# Patient Record
Sex: Male | Born: 1965 | Race: White | Marital: Single | Smoking: Current every day smoker
Health system: Southern US, Community
[De-identification: ages and names within clinical notes are randomized; demographics above are authoritative.]

## PROBLEM LIST (undated history)

## (undated) DIAGNOSIS — G459 Transient cerebral ischemic attack, unspecified: Secondary | ICD-10-CM

## (undated) DIAGNOSIS — I219 Acute myocardial infarction, unspecified: Secondary | ICD-10-CM

## (undated) DIAGNOSIS — F1910 Other psychoactive substance abuse, uncomplicated: Secondary | ICD-10-CM

## (undated) DIAGNOSIS — F319 Bipolar disorder, unspecified: Secondary | ICD-10-CM

## (undated) DIAGNOSIS — M549 Dorsalgia, unspecified: Secondary | ICD-10-CM

## (undated) DIAGNOSIS — M1990 Unspecified osteoarthritis, unspecified site: Secondary | ICD-10-CM

## (undated) DIAGNOSIS — Z720 Tobacco use: Secondary | ICD-10-CM

## (undated) HISTORY — PX: FOOT SURGERY: SHX648

## (undated) HISTORY — DX: Unspecified osteoarthritis, unspecified site: M19.90

## (undated) HISTORY — PX: PACEMAKER PLACEMENT: SHX43

---

## 2004-01-01 ENCOUNTER — Emergency Department: Payer: Self-pay | Admitting: General Practice

## 2004-04-02 ENCOUNTER — Emergency Department: Payer: Self-pay | Admitting: Emergency Medicine

## 2006-04-02 ENCOUNTER — Emergency Department: Payer: Self-pay | Admitting: Emergency Medicine

## 2008-07-08 ENCOUNTER — Emergency Department (HOSPITAL_COMMUNITY): Admission: EM | Admit: 2008-07-08 | Discharge: 2008-07-08 | Payer: Self-pay | Admitting: Emergency Medicine

## 2008-08-07 ENCOUNTER — Emergency Department (HOSPITAL_COMMUNITY): Admission: EM | Admit: 2008-08-07 | Discharge: 2008-08-07 | Payer: Self-pay | Admitting: Emergency Medicine

## 2009-09-08 ENCOUNTER — Inpatient Hospital Stay: Payer: Self-pay | Admitting: Unknown Physician Specialty

## 2009-12-08 ENCOUNTER — Emergency Department (HOSPITAL_COMMUNITY): Admission: EM | Admit: 2009-12-08 | Discharge: 2009-12-08 | Payer: Self-pay | Admitting: Emergency Medicine

## 2009-12-15 ENCOUNTER — Inpatient Hospital Stay: Payer: Self-pay | Admitting: Unknown Physician Specialty

## 2009-12-30 ENCOUNTER — Emergency Department (HOSPITAL_COMMUNITY): Admission: EM | Admit: 2009-12-30 | Discharge: 2009-12-30 | Payer: Self-pay | Admitting: Emergency Medicine

## 2010-01-01 ENCOUNTER — Emergency Department (HOSPITAL_COMMUNITY): Admission: EM | Admit: 2010-01-01 | Discharge: 2010-01-01 | Payer: Self-pay | Admitting: Emergency Medicine

## 2010-01-05 ENCOUNTER — Emergency Department (HOSPITAL_COMMUNITY): Admission: EM | Admit: 2010-01-05 | Discharge: 2010-01-05 | Payer: Self-pay | Admitting: Emergency Medicine

## 2010-01-16 ENCOUNTER — Emergency Department: Payer: Self-pay | Admitting: Emergency Medicine

## 2010-05-04 ENCOUNTER — Ambulatory Visit: Payer: Self-pay | Admitting: Sports Medicine

## 2010-06-17 ENCOUNTER — Emergency Department (HOSPITAL_COMMUNITY)
Admission: EM | Admit: 2010-06-17 | Discharge: 2010-06-17 | Disposition: A | Discharge status: Home / Self Care | Payer: Self-pay | Attending: Emergency Medicine | Admitting: Emergency Medicine

## 2010-06-17 DIAGNOSIS — T310 Burns involving less than 10% of body surface: Secondary | ICD-10-CM | POA: Insufficient documentation

## 2010-06-17 DIAGNOSIS — T22219A Burn of second degree of unspecified forearm, initial encounter: Secondary | ICD-10-CM | POA: Insufficient documentation

## 2010-06-17 DIAGNOSIS — IMO0002 Reserved for concepts with insufficient information to code with codable children: Secondary | ICD-10-CM | POA: Insufficient documentation

## 2010-06-17 DIAGNOSIS — X010XXA Exposure to flames in uncontrolled fire, not in building or structure, initial encounter: Secondary | ICD-10-CM | POA: Insufficient documentation

## 2010-06-17 DIAGNOSIS — Y929 Unspecified place or not applicable: Secondary | ICD-10-CM | POA: Insufficient documentation

## 2010-06-17 DIAGNOSIS — F341 Dysthymic disorder: Secondary | ICD-10-CM | POA: Insufficient documentation

## 2010-06-25 LAB — DIFFERENTIAL
Eosinophils Absolute: 0.3 10*3/uL (ref 0.0–0.7)
Eosinophils Relative: 4 % (ref 0–5)
Lymphs Abs: 2.2 10*3/uL (ref 0.7–4.0)

## 2010-06-25 LAB — URINALYSIS, ROUTINE W REFLEX MICROSCOPIC
Bilirubin Urine: NEGATIVE
Protein, ur: NEGATIVE mg/dL
Urobilinogen, UA: 0.2 mg/dL (ref 0.0–1.0)

## 2010-06-25 LAB — POCT I-STAT, CHEM 8
Creatinine, Ser: 1.2 mg/dL (ref 0.4–1.5)
Glucose, Bld: 102 mg/dL — ABNORMAL HIGH (ref 70–99)
Hemoglobin: 16.3 g/dL (ref 13.0–17.0)
Sodium: 141 mEq/L (ref 135–145)
TCO2: 24 mmol/L (ref 0–100)

## 2010-06-25 LAB — CBC
HCT: 44.5 % (ref 39.0–52.0)
MCHC: 34.5 g/dL (ref 30.0–36.0)
MCV: 89.1 fL (ref 78.0–100.0)
Platelets: 186 10*3/uL (ref 150–400)
WBC: 7.9 10*3/uL (ref 4.0–10.5)

## 2010-07-20 ENCOUNTER — Emergency Department (HOSPITAL_COMMUNITY)
Admission: EM | Admit: 2010-07-20 | Discharge: 2010-07-20 | Disposition: A | Discharge status: Home / Self Care | Payer: Self-pay | Attending: Emergency Medicine | Admitting: Emergency Medicine

## 2010-07-20 ENCOUNTER — Emergency Department (HOSPITAL_COMMUNITY): Payer: Self-pay

## 2010-07-20 DIAGNOSIS — M25569 Pain in unspecified knee: Secondary | ICD-10-CM | POA: Insufficient documentation

## 2010-07-20 DIAGNOSIS — M542 Cervicalgia: Secondary | ICD-10-CM | POA: Insufficient documentation

## 2010-07-20 DIAGNOSIS — M545 Low back pain, unspecified: Secondary | ICD-10-CM | POA: Insufficient documentation

## 2010-07-20 DIAGNOSIS — M546 Pain in thoracic spine: Secondary | ICD-10-CM | POA: Insufficient documentation

## 2010-07-20 DIAGNOSIS — F341 Dysthymic disorder: Secondary | ICD-10-CM | POA: Insufficient documentation

## 2010-07-20 DIAGNOSIS — Z79899 Other long term (current) drug therapy: Secondary | ICD-10-CM | POA: Insufficient documentation

## 2010-08-27 ENCOUNTER — Emergency Department (HOSPITAL_COMMUNITY): Payer: Self-pay

## 2010-08-27 ENCOUNTER — Emergency Department (HOSPITAL_COMMUNITY)
Admission: EM | Admit: 2010-08-27 | Discharge: 2010-08-27 | Disposition: A | Discharge status: Home / Self Care | Payer: Self-pay | Attending: Emergency Medicine | Admitting: Emergency Medicine

## 2010-08-27 DIAGNOSIS — Z7982 Long term (current) use of aspirin: Secondary | ICD-10-CM | POA: Insufficient documentation

## 2010-08-27 DIAGNOSIS — F341 Dysthymic disorder: Secondary | ICD-10-CM | POA: Insufficient documentation

## 2010-08-27 DIAGNOSIS — G8929 Other chronic pain: Secondary | ICD-10-CM | POA: Insufficient documentation

## 2010-08-27 DIAGNOSIS — M25476 Effusion, unspecified foot: Secondary | ICD-10-CM | POA: Insufficient documentation

## 2010-08-27 DIAGNOSIS — M25473 Effusion, unspecified ankle: Secondary | ICD-10-CM | POA: Insufficient documentation

## 2010-08-27 DIAGNOSIS — M79609 Pain in unspecified limb: Secondary | ICD-10-CM | POA: Insufficient documentation

## 2010-08-27 DIAGNOSIS — Z79899 Other long term (current) drug therapy: Secondary | ICD-10-CM | POA: Insufficient documentation

## 2010-08-27 DIAGNOSIS — IMO0002 Reserved for concepts with insufficient information to code with codable children: Secondary | ICD-10-CM | POA: Insufficient documentation

## 2010-08-27 DIAGNOSIS — M25579 Pain in unspecified ankle and joints of unspecified foot: Secondary | ICD-10-CM | POA: Insufficient documentation

## 2010-12-29 ENCOUNTER — Emergency Department: Payer: Self-pay | Admitting: Emergency Medicine

## 2011-03-19 ENCOUNTER — Inpatient Hospital Stay: Payer: Self-pay | Admitting: Internal Medicine

## 2011-03-19 LAB — DRUG SCREEN, URINE
Barbiturates, Ur Screen: NEGATIVE (ref ?–200)
Cannabinoid 50 Ng, Ur ~~LOC~~: POSITIVE (ref ?–50)
Cocaine Metabolite,Ur ~~LOC~~: POSITIVE (ref ?–300)
Methadone, Ur Screen: NEGATIVE (ref ?–300)
Phencyclidine (PCP) Ur S: NEGATIVE (ref ?–25)

## 2011-03-19 LAB — BASIC METABOLIC PANEL
BUN: 11 mg/dL (ref 7–18)
Calcium, Total: 8.4 mg/dL — ABNORMAL LOW (ref 8.5–10.1)
Co2: 25 mmol/L (ref 21–32)
Creatinine: 1.02 mg/dL (ref 0.60–1.30)
EGFR (African American): >60
EGFR (Non-African Amer.): >60
Glucose: 88 mg/dL (ref 65–99)
Potassium: 3.6 mmol/L (ref 3.5–5.1)
Sodium: 146 mmol/L — ABNORMAL HIGH (ref 136–145)

## 2011-03-19 LAB — URINALYSIS, COMPLETE
Bacteria: NONE SEEN
Bilirubin,UR: NEGATIVE
Glucose,UR: NEGATIVE mg/dL (ref 0–75)
Leukocyte Esterase: NEGATIVE
Nitrite: NEGATIVE
Ph: 7 (ref 4.5–8.0)
RBC,UR: <1 /HPF (ref 0–5)
Squamous Epithelial: NONE SEEN

## 2011-03-19 LAB — ETHANOL: Ethanol: 79 mg/dL

## 2011-03-19 LAB — TROPONIN I
Troponin-I: <0.02 ng/mL
Troponin-I: <0.02 ng/mL

## 2011-03-19 LAB — CK-MB: CK-MB: 0.8 ng/mL (ref 0.5–3.6)

## 2011-03-19 LAB — CK TOTAL AND CKMB (NOT AT ARMC)
CK, Total: 74 U/L (ref 35–232)
CK, Total: 65 U/L (ref 35–232)
CK-MB: 0.9 ng/mL (ref 0.5–3.6)

## 2011-03-19 LAB — CBC
HGB: 14.5 g/dL (ref 13.0–18.0)
MCV: 90 fL (ref 80–100)
Platelet: 203 10*3/uL (ref 150–440)
RBC: 4.78 10*6/uL (ref 4.40–5.90)
RDW: 13.9 % (ref 11.5–14.5)
WBC: 9.1 10*3/uL (ref 3.8–10.6)

## 2011-03-20 LAB — CBC WITH DIFFERENTIAL/PLATELET
Eosinophil #: 0.4 10*3/uL (ref 0.0–0.7)
Eosinophil %: 5.1 %
HGB: 13.9 g/dL (ref 13.0–18.0)
Lymphocyte %: 26.2 %
MCH: 30.1 pg (ref 26.0–34.0)
Monocyte #: 1 10*3/uL — ABNORMAL HIGH (ref 0.0–0.7)
Neutrophil %: 55.6 %
Platelet: 161 10*3/uL (ref 150–440)
RBC: 4.61 10*6/uL (ref 4.40–5.90)

## 2011-03-20 LAB — BASIC METABOLIC PANEL
Anion Gap: 8 (ref 7–16)
BUN: 15 mg/dL (ref 7–18)
Calcium, Total: 8 mg/dL — ABNORMAL LOW (ref 8.5–10.1)
Chloride: 106 mmol/L (ref 98–107)
Co2: 30 mmol/L (ref 21–32)
Creatinine: 1.13 mg/dL (ref 0.60–1.30)
EGFR (African American): >60
EGFR (Non-African Amer.): >60
Glucose: 94 mg/dL (ref 65–99)
Osmolality: 287 (ref 275–301)

## 2011-03-20 LAB — LIPID PANEL: Triglycerides: 154 mg/dL (ref 0–200)

## 2011-05-06 LAB — BASIC METABOLIC PANEL
Anion Gap: 8 (ref 7–16)
BUN: 11 mg/dL (ref 7–18)
Calcium, Total: 8.1 mg/dL — ABNORMAL LOW (ref 8.5–10.1)
Chloride: 108 mmol/L — ABNORMAL HIGH (ref 98–107)
Co2: 28 mmol/L (ref 21–32)
Creatinine: 0.92 mg/dL (ref 0.60–1.30)
EGFR (African American): >60
Potassium: 4.3 mmol/L (ref 3.5–5.1)
Sodium: 144 mmol/L (ref 136–145)

## 2011-05-06 LAB — CBC
HCT: 43 % (ref 40.0–52.0)
MCH: 30.5 pg (ref 26.0–34.0)
MCV: 90 fL (ref 80–100)
Platelet: 197 10*3/uL (ref 150–440)
RDW: 13.7 % (ref 11.5–14.5)
WBC: 10.2 10*3/uL (ref 3.8–10.6)

## 2011-05-06 LAB — TROPONIN I: Troponin-I: <0.02 ng/mL

## 2011-05-07 ENCOUNTER — Observation Stay: Payer: Self-pay | Admitting: Student

## 2011-05-07 LAB — DRUG SCREEN, URINE
Barbiturates, Ur Screen: NEGATIVE (ref ?–200)
Cannabinoid 50 Ng, Ur ~~LOC~~: POSITIVE (ref ?–50)
Cocaine Metabolite,Ur ~~LOC~~: POSITIVE (ref ?–300)
MDMA (Ecstasy)Ur Screen: NEGATIVE (ref ?–500)
Opiate, Ur Screen: POSITIVE (ref ?–300)
Phencyclidine (PCP) Ur S: NEGATIVE (ref ?–25)

## 2011-05-07 LAB — TROPONIN I: Troponin-I: <0.02 ng/mL

## 2011-05-07 LAB — TSH: Thyroid Stimulating Horm: 4.64 u[IU]/mL — ABNORMAL HIGH

## 2011-05-07 LAB — CK TOTAL AND CKMB (NOT AT ARMC)
CK, Total: 105 U/L (ref 35–232)
CK-MB: 0.5 ng/mL (ref 0.5–3.6)

## 2011-05-07 LAB — CK-MB
CK-MB: 0.6 ng/mL (ref 0.5–3.6)
CK-MB: 0.5 ng/mL (ref 0.5–3.6)

## 2011-06-08 ENCOUNTER — Emergency Department (HOSPITAL_COMMUNITY)
Admission: EM | Admit: 2011-06-08 | Discharge: 2011-06-09 | Disposition: A | Discharge status: Home / Self Care | Payer: Self-pay | Attending: Emergency Medicine | Admitting: Emergency Medicine

## 2011-06-08 ENCOUNTER — Encounter (HOSPITAL_COMMUNITY): Payer: Self-pay | Admitting: *Deleted

## 2011-06-08 DIAGNOSIS — S025XXA Fracture of tooth (traumatic), initial encounter for closed fracture: Secondary | ICD-10-CM | POA: Insufficient documentation

## 2011-06-08 DIAGNOSIS — X58XXXA Exposure to other specified factors, initial encounter: Secondary | ICD-10-CM | POA: Insufficient documentation

## 2011-06-08 DIAGNOSIS — Z79899 Other long term (current) drug therapy: Secondary | ICD-10-CM | POA: Insufficient documentation

## 2011-06-08 DIAGNOSIS — K0889 Other specified disorders of teeth and supporting structures: Secondary | ICD-10-CM

## 2011-06-08 DIAGNOSIS — F17200 Nicotine dependence, unspecified, uncomplicated: Secondary | ICD-10-CM | POA: Insufficient documentation

## 2011-06-08 DIAGNOSIS — Z7982 Long term (current) use of aspirin: Secondary | ICD-10-CM | POA: Insufficient documentation

## 2011-06-08 MED ORDER — OXYCODONE-ACETAMINOPHEN 5-325 MG PO TABS: 2.0000 | ORAL_TABLET | ORAL | Status: AC | PRN

## 2011-06-08 MED ORDER — OXYCODONE-ACETAMINOPHEN 5-325 MG PO TABS
2.0000 | ORAL_TABLET | Freq: Once | ORAL | Status: AC
  Administered 2011-06-08: 2 via ORAL
  Filled 2011-06-08: qty 2

## 2011-06-08 MED ORDER — PENICILLIN V POTASSIUM 500 MG PO TABS: 500.0000 mg | ORAL_TABLET | Freq: Four times a day (QID) | ORAL | Status: AC

## 2011-06-08 NOTE — Discharge Instructions (Signed)
 Dental Pain A tooth ache may be caused by cavities (tooth decay). Cavities expose the nerve of the tooth to air and hot or cold temperatures. It may come from an infection or abscess (also called a boil or furuncle) around your tooth. It is also often caused by dental caries (tooth decay). This causes the pain you are having. DIAGNOSIS  Your caregiver can diagnose this problem by exam. TREATMENT   If caused by an infection, it may be treated with medications which kill germs (antibiotics) and pain medications as prescribed by your caregiver. Take medications as directed.   Only take over-the-counter or prescription medicines for pain, discomfort, or fever as directed by your caregiver.   Whether the tooth ache today is caused by infection or dental disease, you should see your dentist as soon as possible for further care.  SEEK MEDICAL CARE IF: The exam and treatment you received today has been provided on an emergency basis only. This is not a substitute for complete medical or dental care. If your problem worsens or new problems (symptoms) appear, and you are unable to meet with your dentist, call or return to this location. SEEK IMMEDIATE MEDICAL CARE IF:   You have a fever.   You develop redness and swelling of your face, jaw, or neck.   You are unable to open your mouth.   You have severe pain uncontrolled by pain medicine.  MAKE SURE YOU:   Understand these instructions.   Will watch your condition.   Will get help right away if you are not doing well or get worse.  Document Released: 03/03/2005 Document Revised: 02/20/2011 Document Reviewed: 10/20/2007 [facility] Patient Information 2012 [place], [place].

## 2011-06-08 NOTE — ED Provider Notes (Signed)
History     CSN: [id number]  Arrival date & time 06/08/11  2045   First MD Initiated Contact with Patient 06/08/11 2259      Chief Complaint  Patient presents with  . Dental Pain    (Consider location/radiation/quality/duration/timing/severity/associated sxs/prior treatment) HPI Comments: Patient complains of right lower molar pain for the past 3 days. His poor dentition throughout. He denies any fever, vomiting, difficulty breathing or swallowing. He denies any abdominal pain, chest pain or shortness of breath. He states he does have a dentist. She's been taking ibuprofen for pain.  The history is provided by the patient.    History reviewed. No pertinent past medical history.  Past Surgical History  Procedure Date  . Foot surgery     No family history on file.  History  Substance Use Topics  . Smoking status: Current Everyday Smoker    Types: Cigarettes  . Smokeless tobacco: Not on file  . Alcohol Use: No      Review of Systems  Constitutional: Negative for fever, activity change and appetite change.  HENT: Positive for dental problem.   Respiratory: Negative for cough, chest tightness and shortness of breath.   Cardiovascular: Negative for chest pain.  Gastrointestinal: Negative for nausea and vomiting.  Genitourinary: Negative for dysuria and hematuria.  Musculoskeletal: Negative for back pain.  Skin: Negative for rash.  Neurological: Negative for headaches.    Allergies  Vicodin  Home Medications   Current Outpatient Rx  Name Route Sig Dispense Refill  . ALPRAZOLAM 1 MG PO TABS Oral Take 1 mg by mouth 3 (three) times daily as needed. For anxiety    . ASPIRIN 81 MG PO TABS Oral Take 81 mg by mouth daily.    . BUPROPION HCL ER (XL) 150 MG PO TB24 Oral Take 150 mg by mouth daily.    . IBUPROFEN 200 MG PO TABS Oral Take 800 mg by mouth every 6 (six) hours as needed. For pain    . ADULT MULTIVITAMIN W/MINERALS CH Oral Take 1 tablet by mouth daily.    .  OXYCODONE-ACETAMINOPHEN 5-325 MG PO TABS Oral Take 2 tablets by mouth every 4 (four) hours as needed for pain. 15 tablet 0  . PENICILLIN V POTASSIUM 500 MG PO TABS Oral Take 1 tablet (500 mg total) by mouth 4 (four) times daily. 40 tablet 0    BP 144/97  Temp(Src) 97.9 F (36.6 C) (Oral)  Resp 18  SpO2 97%  Physical Exam  Constitutional: He is oriented to person, place, and time. He appears well-developed and well-nourished. No distress.  HENT:  Head: Normocephalic and atraumatic.  Mouth/Throat: Oropharynx is clear and moist. No oropharyngeal exudate.    Eyes: Conjunctivae and EOM are normal. Pupils are equal, round, and reactive to light.  Neck: Normal range of motion. Neck supple.  Cardiovascular: Normal rate, regular rhythm and normal heart sounds.   Pulmonary/Chest: Effort normal and breath sounds normal. No respiratory distress.  Abdominal: Soft. There is no tenderness. There is no rebound and no guarding.  Musculoskeletal: Normal range of motion. He exhibits no edema and no tenderness.  Neurological: He is alert and oriented to person, place, and time. No cranial nerve deficit.  Skin: Skin is warm.    ED Course  Procedures (including critical care time)  Labs Reviewed - No data to display No results found.   1. Pain, dental       MDM  Dental pain and fracture without abscess. No difficulty breathing or  swallowing. No evidence of ludwig's angina.  Pain meds, abx, dental followup.        Glynn Octave, MD 06/08/11 [phone]

## 2011-06-08 NOTE — ED Notes (Signed)
Pt here with severe dental pain.  Pain on right side increasing for 3 days

## 2011-08-01 ENCOUNTER — Emergency Department: Payer: Self-pay | Admitting: *Deleted

## 2013-10-16 ENCOUNTER — Emergency Department: Payer: Self-pay | Admitting: Emergency Medicine

## 2013-10-16 LAB — CBC
HCT: 43.7 % (ref 40.0–52.0)
HGB: 14.2 g/dL (ref 13.0–18.0)
MCH: 29.8 pg (ref 26.0–34.0)
MCHC: 32.5 g/dL (ref 32.0–36.0)
MCV: 92 fL (ref 80–100)
Platelet: 194 10*3/uL (ref 150–440)
RBC: 4.76 10*6/uL (ref 4.40–5.90)
RDW: 14.3 % (ref 11.5–14.5)
WBC: 8 10*3/uL (ref 3.8–10.6)

## 2013-10-16 LAB — COMPREHENSIVE METABOLIC PANEL
ALBUMIN: 3.7 g/dL (ref 3.4–5.0)
Alkaline Phosphatase: 95 U/L
Anion Gap: 4 — ABNORMAL LOW (ref 7–16)
BUN: 9 mg/dL (ref 7–18)
Bilirubin,Total: 0.4 mg/dL (ref 0.2–1.0)
CO2: 28 mmol/L (ref 21–32)
Calcium, Total: 8.5 mg/dL (ref 8.5–10.1)
Chloride: 109 mmol/L — ABNORMAL HIGH (ref 98–107)
Creatinine: 1.08 mg/dL (ref 0.60–1.30)
EGFR (African American): >60
EGFR (Non-African Amer.): >60
GLUCOSE: 85 mg/dL (ref 65–99)
OSMOLALITY: 279 (ref 275–301)
Potassium: 3.8 mmol/L (ref 3.5–5.1)
SGOT(AST): 25 U/L (ref 15–37)
SGPT (ALT): 22 U/L
SODIUM: 141 mmol/L (ref 136–145)
Total Protein: 6.8 g/dL (ref 6.4–8.2)

## 2013-10-16 LAB — CK TOTAL AND CKMB (NOT AT ARMC)
CK, Total: 93 U/L
CK-MB: 0.9 ng/mL (ref 0.5–3.6)

## 2013-10-16 LAB — TROPONIN I

## 2014-03-05 ENCOUNTER — Emergency Department (HOSPITAL_COMMUNITY)
Admission: EM | Admit: 2014-03-05 | Discharge: 2014-03-05 | Disposition: A | Discharge status: Home / Self Care | Payer: Self-pay | Attending: Emergency Medicine | Admitting: Emergency Medicine

## 2014-03-05 ENCOUNTER — Encounter (HOSPITAL_COMMUNITY): Payer: Self-pay | Admitting: *Deleted

## 2014-03-05 DIAGNOSIS — Z720 Tobacco use: Secondary | ICD-10-CM | POA: Insufficient documentation

## 2014-03-05 DIAGNOSIS — I252 Old myocardial infarction: Secondary | ICD-10-CM | POA: Insufficient documentation

## 2014-03-05 DIAGNOSIS — Z79899 Other long term (current) drug therapy: Secondary | ICD-10-CM | POA: Insufficient documentation

## 2014-03-05 DIAGNOSIS — Z7982 Long term (current) use of aspirin: Secondary | ICD-10-CM | POA: Insufficient documentation

## 2014-03-05 DIAGNOSIS — M5432 Sciatica, left side: Secondary | ICD-10-CM | POA: Insufficient documentation

## 2014-03-05 HISTORY — DX: Dorsalgia, unspecified: M54.9

## 2014-03-05 HISTORY — DX: Acute myocardial infarction, unspecified: I21.9

## 2014-03-05 MED ORDER — CYCLOBENZAPRINE HCL 10 MG PO TABS: 10.0000 mg | ORAL_TABLET | Freq: Two times a day (BID) | ORAL | Status: DC | PRN

## 2014-03-05 MED ORDER — TRAMADOL HCL 50 MG PO TABS: 50.0000 mg | ORAL_TABLET | Freq: Four times a day (QID) | ORAL | Status: DC | PRN

## 2014-03-05 MED ORDER — OXYCODONE-ACETAMINOPHEN 5-325 MG PO TABS
1.0000 | ORAL_TABLET | Freq: Once | ORAL | Status: AC
  Administered 2014-03-05: 1 via ORAL
  Filled 2014-03-05: qty 1

## 2014-03-05 MED ORDER — CYCLOBENZAPRINE HCL 10 MG PO TABS
10.0000 mg | ORAL_TABLET | Freq: Once | ORAL | Status: AC
  Administered 2014-03-05: 10 mg via ORAL
  Filled 2014-03-05: qty 1

## 2014-03-05 MED ORDER — IBUPROFEN 800 MG PO TABS: 800.0000 mg | ORAL_TABLET | Freq: Three times a day (TID) | ORAL | Status: DC

## 2014-03-05 NOTE — ED Notes (Signed)
Lower back pain radiating to tailbone and down left leg. States pain began 2 days ago after splitting wood for 2 days. Hx of same.

## 2014-03-05 NOTE — ED Provider Notes (Signed)
CSN: [id number]     Arrival date & time 03/05/14  1755 History  This chart was scribed for non-physician practitioner working with [name] MawKristen N Ward, DO by Elveria Risingimelie Horne, ED Scribe. This patient was seen in room APFT24/APFT24 and the patient's care was started at 6:13 PM.   Chief Complaint  Patient presents with  . Back Pain   Patient is a 48 y.o. male presenting with back pain. The history is provided by the patient. No language interpreter was used.  Back Pain Location:  Lumbar spine Quality:  Aching and shooting Radiates to:  L posterior upper leg Pain severity:  Moderate Pain is:  Same all the time Onset quality:  Gradual Duration:  2 days Timing:  Constant Progression:  Worsening Chronicity:  New Relieved by:  Nothing Worsened by:  Ambulation, standing and twisting Ineffective treatments:  Ibuprofen Associated symptoms: no bladder incontinence, no bowel incontinence, no dysuria, no fever, no numbness and no weakness    HPI Comments: Isac CaddyRobert K Kady is a 48 y.o. male who presents to the Emergency Department with a back injury incurred while chopping wood two days ago. Patient reports lower back pain with radiation to his buttocks/sacral region and extending down the length of his left leg. Patient reports treatment with ibuprofen 800mg  and alternating ice and heat, but denies relief or improvement. Patient denies bladder/bowel incontinence/changes in habits, urinary symptoms or abdominal pain. Patient denies additional medical complaints. Patient reports that he does not have a PCP; he just moved to the area 6 months ago and has not established care here.   Past Medical History  Diagnosis Date  . Back pain   . MI (myocardial infarction)     at age 48   Past Surgical History  Procedure Laterality Date  . Foot surgery     No family history on file. History  Substance Use Topics  . Smoking status: Current Every Day Smoker    Types: Cigarettes  . Smokeless tobacco: Not on file   . Alcohol Use: No    Review of Systems  Constitutional: Negative for fever and chills.  Gastrointestinal: Negative for nausea, vomiting, diarrhea and bowel incontinence.  Genitourinary: Negative for bladder incontinence, dysuria and hematuria.  Musculoskeletal: Positive for back pain. Negative for gait problem.  Neurological: Negative for weakness and numbness.  all other systems negative  Allergies  Vicodin  Home Medications   Prior to Admission medications   Medication Sig Start Date End Date Taking? Authorizing Provider  ALPRAZolam Prudy Feeler(XANAX) 1 MG tablet Take 1 mg by mouth 3 (three) times daily as needed. For anxiety    Historical Provider, MD  aspirin 81 MG tablet Take 81 mg by mouth daily.    Historical Provider, MD  buPROPion (WELLBUTRIN XL) 150 MG 24 hr tablet Take 150 mg by mouth daily.    Historical Provider, MD  cyclobenzaprine (FLEXERIL) 10 MG tablet Take 1 tablet (10 mg total) by mouth 2 (two) times daily as needed for muscle spasms. 03/05/14   Hope Orlene OchM Neese, NP  ibuprofen (ADVIL,MOTRIN) 800 MG tablet Take 1 tablet (800 mg total) by mouth 3 (three) times daily. 03/05/14   Hope Orlene OchM Neese, NP  Multiple Vitamin (MULITIVITAMIN WITH MINERALS) TABS Take 1 tablet by mouth daily.    Historical Provider, MD  traMADol (ULTRAM) 50 MG tablet Take 1 tablet (50 mg total) by mouth every 6 (six) hours as needed. 03/05/14   Hope Orlene OchM Neese, NP   Triage Vitals: BP 126/81 mmHg  Pulse 101  Temp(Src) 98.1 F (36.7 C) (Oral)  Resp 18  Ht 6\' 2"  (1.88 m)  Wt 240 lb (108.863 kg)  BMI 30.80 kg/m2  SpO2 100% Physical Exam  Constitutional: He is oriented to person, place, and time. He appears well-developed and well-nourished. No distress.  HENT:  Head: Normocephalic and atraumatic.  Eyes: EOM are normal. Pupils are equal, round, and reactive to light.  Neck: Normal range of motion. Neck supple.  Cardiovascular: Normal rate and regular rhythm.   Pulmonary/Chest: Effort normal. No respiratory  distress. He has no wheezes. He has no rales.  Abdominal: Soft. Bowel sounds are normal. There is no tenderness.  Musculoskeletal: Normal range of motion. He exhibits no edema.       Lumbar back: He exhibits tenderness. He exhibits normal range of motion, no deformity, no spasm and normal pulse.  Pain over left sciatic nerve. Muscle spasm noted lower left lumbar area.     Neurological: He is alert and oriented to person, place, and time. He has normal strength. No cranial nerve deficit or sensory deficit. Coordination and gait normal.  Reflex Scores:      Bicep reflexes are 2+ on the right side and 2+ on the left side.      Brachioradialis reflexes are 2+ on the right side and 2+ on the left side.      Patellar reflexes are 2+ on the right side and 2+ on the left side.      Achilles reflexes are 2+ on the right side and 2+ on the left side. Steady gait, no foot drag. Pedal pulses equal, adequate circulation, good touch sensation.   Skin: Skin is warm and dry.  Psychiatric: He has a normal mood and affect. His behavior is normal.  Nursing note and vitals reviewed.   ED Course  Procedures (including critical care time)  COORDINATION OF CARE: 6:16 PM- Patient advised to continue use of ibuprofen. Plans to prescribe a pain medication and muscle relaxant. Discussed treatment plan with patient at bedside and patient agreed to plan.    MDM  48 y.o. male with low back pain that radiates to the left buttock after cutting wood 2 days ago. Stable for discharge without neuro deficits. Discussed with the patient clinical findings and plan of care and all questioned fully answered. He will return if any problems arise.    Medication List    TAKE these medications        cyclobenzaprine 10 MG tablet  Commonly known as:  FLEXERIL  Take 1 tablet (10 mg total) by mouth 2 (two) times daily as needed for muscle spasms.     ibuprofen 800 MG tablet  Commonly known as:  ADVIL,MOTRIN  Take 1 tablet (800  mg total) by mouth 3 (three) times daily.     traMADol 50 MG tablet  Commonly known as:  ULTRAM  Take 1 tablet (50 mg total) by mouth every 6 (six) hours as needed.      ASK your doctor about these medications        ALPRAZolam 1 MG tablet  Commonly known as:  XANAX  Take 1 mg by mouth 3 (three) times daily as needed. For anxiety     aspirin 81 MG tablet  Take 81 mg by mouth daily.     buPROPion 150 MG 24 hr tablet  Commonly known as:  WELLBUTRIN XL  Take 150 mg by mouth daily.     multivitamin with minerals Tabs tablet  Take 1 tablet by  mouth daily.        Final diagnoses:  Sciatica, left   I personally performed the services described in this documentation, which was scribed in my presence. The recorded information has been reviewed and is accurate.    [place] M Neese, [place] 03/05/14 1838  At d/c the patient states he has Tramadol at home and wants something stronger. He is allergic to Vicodin. RN discussed with the patient that he has had a Percocet and Flexeril here and he is getting Rx for Tramadol and flexeril and ibuprofen. [facility] and don't send him a bill if this is the best we can do and we can keep our prescriptions. Patient refused to take Rx or d/c instructions.   [facility] Orlene OchM Neese, NP 03/05/14 1859  [name] MawKristen N Ward, DO 03/06/14 [id number]

## 2014-03-05 NOTE — Discharge Instructions (Signed)
Do not take the narcotic if you are driving as it will make you sleepy.   Back Pain, Adult Back pain is very common. The pain often gets better over time. The cause of back pain is usually not dangerous. Most people can learn to manage their back pain on their own.  HOME CARE   Stay active. Start with short walks on flat ground if you can. Try to walk farther each day.  Do not sit, drive, or stand in one place for more than 30 minutes. Do not stay in bed.  Do not avoid exercise or work. Activity can help your back heal faster.  Be careful when you bend or lift an object. Bend at your knees, keep the object close to you, and do not twist.  Sleep on a firm mattress. Lie on your side, and bend your knees. If you lie on your back, put a pillow under your knees.  Only take medicines as told by your doctor.  Put ice on the injured area.  Put ice in a plastic bag.  Place a towel between your skin and the bag.  Leave the ice on for 15-20 minutes, 03-04 times a day for the first 2 to 3 days. After that, you can switch between ice and heat packs.  Ask your doctor about back exercises or massage.  Avoid feeling anxious or stressed. Find good ways to deal with stress, such as exercise. GET HELP RIGHT AWAY IF:   Your pain does not go away with rest or medicine.  Your pain does not go away in 1 week.  You have new problems.  You do not feel well.  The pain spreads into your legs.  You cannot control when you poop (bowel movement) or pee (urinate).  Your arms or legs feel weak or lose feeling (numbness).  You feel sick to your stomach (nauseous) or throw up (vomit).  You have belly (abdominal) pain.  You feel like you may pass out (faint). MAKE SURE YOU:   Understand these instructions.  Will watch your condition.  Will get help right away if you are not doing well or get worse. Document Released: 08/20/2007 Document Revised: 05/26/2011 Document Reviewed:  07/05/2013 [facility] Patient Information 2015 [place]. This information is not intended to replace advice given to you by your health care provider. Make sure you discuss any questions you have with your health care provider.

## 2014-03-05 NOTE — ED Notes (Signed)
Pt unhappy that he is getting nothing stronger. Explained again to him the reason. Also explained that he needed to given po medication time to work. Now pt is upset that he isn't getting a shot. Security called to prevent incident. Pt states, "[facility], I don't expect a bill from here if that's the best yall are going to do. You can keep those prescriptions. Explained to pt that he could throw them away if he wished to do so or have them filled.Pt wheeled out by security.

## 2014-03-05 NOTE — ED Notes (Signed)
Pt states, "I have tramadol at home, will you see if she can give me something else?" Explained to patient that NP was initially going to prescribe Vicodin but it is listed as an allergy. Pt states, "I'm not really allergic, it just upsets my stomach. Will you see if she can give me something else stronger?" Explained to pt that ERs are attempting not to prescribe such strong narcotics because of the high incidence of prescription drug overdose. Explained that if patients need something stronger, they usually need to see their PCP for stronger med or for pain management referral. Waiting to speak with NP

## 2014-06-14 ENCOUNTER — Encounter (HOSPITAL_COMMUNITY): Payer: Self-pay | Admitting: *Deleted

## 2014-06-14 ENCOUNTER — Emergency Department (HOSPITAL_COMMUNITY): Payer: Self-pay

## 2014-06-14 ENCOUNTER — Observation Stay (HOSPITAL_COMMUNITY)
Admission: EM | Admit: 2014-06-14 | Discharge: 2014-06-16 | Disposition: A | Discharge status: Home / Self Care | Payer: Self-pay | Attending: Internal Medicine | Admitting: Internal Medicine

## 2014-06-14 DIAGNOSIS — R29898 Other symptoms and signs involving the musculoskeletal system: Secondary | ICD-10-CM

## 2014-06-14 DIAGNOSIS — R51 Headache: Secondary | ICD-10-CM | POA: Insufficient documentation

## 2014-06-14 DIAGNOSIS — F1210 Cannabis abuse, uncomplicated: Secondary | ICD-10-CM | POA: Insufficient documentation

## 2014-06-14 DIAGNOSIS — I252 Old myocardial infarction: Secondary | ICD-10-CM | POA: Insufficient documentation

## 2014-06-14 DIAGNOSIS — R531 Weakness: Principal | ICD-10-CM | POA: Diagnosis present

## 2014-06-14 DIAGNOSIS — M2550 Pain in unspecified joint: Secondary | ICD-10-CM

## 2014-06-14 DIAGNOSIS — G8929 Other chronic pain: Secondary | ICD-10-CM | POA: Diagnosis present

## 2014-06-14 DIAGNOSIS — M6289 Other specified disorders of muscle: Secondary | ICD-10-CM

## 2014-06-14 DIAGNOSIS — R079 Chest pain, unspecified: Secondary | ICD-10-CM | POA: Diagnosis present

## 2014-06-14 DIAGNOSIS — R29810 Facial weakness: Secondary | ICD-10-CM | POA: Insufficient documentation

## 2014-06-14 DIAGNOSIS — M25571 Pain in right ankle and joints of right foot: Secondary | ICD-10-CM | POA: Insufficient documentation

## 2014-06-14 DIAGNOSIS — F1910 Other psychoactive substance abuse, uncomplicated: Secondary | ICD-10-CM | POA: Insufficient documentation

## 2014-06-14 DIAGNOSIS — F319 Bipolar disorder, unspecified: Secondary | ICD-10-CM | POA: Insufficient documentation

## 2014-06-14 DIAGNOSIS — Z885 Allergy status to narcotic agent status: Secondary | ICD-10-CM | POA: Insufficient documentation

## 2014-06-14 DIAGNOSIS — I639 Cerebral infarction, unspecified: Secondary | ICD-10-CM

## 2014-06-14 DIAGNOSIS — F1410 Cocaine abuse, uncomplicated: Secondary | ICD-10-CM | POA: Insufficient documentation

## 2014-06-14 DIAGNOSIS — Z7982 Long term (current) use of aspirin: Secondary | ICD-10-CM | POA: Insufficient documentation

## 2014-06-14 DIAGNOSIS — R200 Anesthesia of skin: Secondary | ICD-10-CM | POA: Insufficient documentation

## 2014-06-14 DIAGNOSIS — R4781 Slurred speech: Secondary | ICD-10-CM | POA: Insufficient documentation

## 2014-06-14 LAB — RAPID URINE DRUG SCREEN, HOSP PERFORMED
AMPHETAMINES: NOT DETECTED
Barbiturates: NOT DETECTED
Benzodiazepines: POSITIVE — AB
Cocaine: POSITIVE — AB
OPIATES: NOT DETECTED
Tetrahydrocannabinol: POSITIVE — AB

## 2014-06-14 LAB — CBC
HEMATOCRIT: 44.1 % (ref 39.0–52.0)
Hemoglobin: 14.8 g/dL (ref 13.0–17.0)
MCH: 30 pg (ref 26.0–34.0)
MCHC: 33.6 g/dL (ref 30.0–36.0)
MCV: 89.5 fL (ref 78.0–100.0)
PLATELETS: 185 10*3/uL (ref 150–400)
RBC: 4.93 MIL/uL (ref 4.22–5.81)
RDW: 13.8 % (ref 11.5–15.5)
WBC: 9.3 10*3/uL (ref 4.0–10.5)

## 2014-06-14 LAB — URINALYSIS, ROUTINE W REFLEX MICROSCOPIC
BILIRUBIN URINE: NEGATIVE
Glucose, UA: NEGATIVE mg/dL
HGB URINE DIPSTICK: NEGATIVE
KETONES UR: NEGATIVE mg/dL
Leukocytes, UA: NEGATIVE
Nitrite: NEGATIVE
Protein, ur: NEGATIVE mg/dL
Specific Gravity, Urine: 1.015 (ref 1.005–1.030)
Urobilinogen, UA: 0.2 mg/dL (ref 0.0–1.0)
pH: 6 (ref 5.0–8.0)

## 2014-06-14 LAB — DIFFERENTIAL
Basophils Absolute: 0 10*3/uL (ref 0.0–0.1)
Basophils Relative: 0 % (ref 0–1)
Eosinophils Absolute: 0.2 10*3/uL (ref 0.0–0.7)
Eosinophils Relative: 3 % (ref 0–5)
Lymphocytes Relative: 19 % (ref 12–46)
Lymphs Abs: 1.7 10*3/uL (ref 0.7–4.0)
MONOS PCT: 11 % (ref 3–12)
Monocytes Absolute: 1 10*3/uL (ref 0.1–1.0)
Neutro Abs: 6.3 10*3/uL (ref 1.7–7.7)
Neutrophils Relative %: 67 % (ref 43–77)

## 2014-06-14 LAB — COMPREHENSIVE METABOLIC PANEL
ALT: 14 U/L (ref 0–53)
ANION GAP: 3 — AB (ref 5–15)
AST: 20 U/L (ref 0–37)
Albumin: 4 g/dL (ref 3.5–5.2)
Alkaline Phosphatase: 102 U/L (ref 39–117)
BUN: 10 mg/dL (ref 6–23)
CALCIUM: 8.8 mg/dL (ref 8.4–10.5)
CHLORIDE: 106 mmol/L (ref 96–112)
CO2: 30 mmol/L (ref 19–32)
CREATININE: 1.26 mg/dL (ref 0.50–1.35)
GFR calc Af Amer: 78 mL/min — ABNORMAL LOW (ref >90)
GFR, EST NON AFRICAN AMERICAN: 67 mL/min — AB (ref >90)
Glucose, Bld: 110 mg/dL — ABNORMAL HIGH (ref 70–99)
Potassium: 3.9 mmol/L (ref 3.5–5.1)
Sodium: 139 mmol/L (ref 135–145)
Total Bilirubin: 0.7 mg/dL (ref 0.3–1.2)
Total Protein: 6.4 g/dL (ref 6.0–8.3)

## 2014-06-14 LAB — PROTIME-INR
INR: 0.93 (ref 0.00–1.49)
INR: 0.9 (ref 0.00–1.49)
PROTHROMBIN TIME: 12.6 s (ref 11.6–15.2)
Prothrombin Time: 12.3 seconds (ref 11.6–15.2)

## 2014-06-14 LAB — I-STAT CHEM 8, ED
BUN: 14 mg/dL (ref 6–23)
CALCIUM ION: 1.12 mmol/L (ref 1.12–1.23)
Chloride: 101 mmol/L (ref 96–112)
Creatinine, Ser: 1.1 mg/dL (ref 0.50–1.35)
Glucose, Bld: 107 mg/dL — ABNORMAL HIGH (ref 70–99)
HCT: 45 % (ref 39.0–52.0)
HEMOGLOBIN: 15.3 g/dL (ref 13.0–17.0)
Potassium: 3.8 mmol/L (ref 3.5–5.1)
Sodium: 140 mmol/L (ref 135–145)
TCO2: 23 mmol/L (ref 0–100)

## 2014-06-14 LAB — TSH: TSH: 1.682 u[IU]/mL (ref 0.350–4.500)

## 2014-06-14 LAB — ETHANOL: Alcohol, Ethyl (B): <5 mg/dL (ref 0–9)

## 2014-06-14 LAB — APTT: aPTT: 32 seconds (ref 24–37)

## 2014-06-14 LAB — I-STAT TROPONIN, ED: TROPONIN I, POC: 0 ng/mL (ref 0.00–0.08)

## 2014-06-14 LAB — TROPONIN I

## 2014-06-14 MED ORDER — KETOROLAC TROMETHAMINE 15 MG/ML IJ SOLN
15.0000 mg | Freq: Once | INTRAMUSCULAR | Status: AC
  Administered 2014-06-14: 15 mg via INTRAVENOUS
  Filled 2014-06-14: qty 1

## 2014-06-14 MED ORDER — MORPHINE SULFATE 4 MG/ML IJ SOLN
4.0000 mg | Freq: Once | INTRAMUSCULAR | Status: AC
  Administered 2014-06-14: 4 mg via INTRAVENOUS
  Filled 2014-06-14: qty 1

## 2014-06-14 MED ORDER — TRAMADOL HCL 50 MG PO TABS
50.0000 mg | ORAL_TABLET | Freq: Four times a day (QID) | ORAL | Status: DC | PRN
  Administered 2014-06-14: 50 mg via ORAL
  Filled 2014-06-14: qty 1

## 2014-06-14 MED ORDER — METOCLOPRAMIDE HCL 5 MG/ML IJ SOLN
10.0000 mg | Freq: Once | INTRAMUSCULAR | Status: AC
  Administered 2014-06-14: 10 mg via INTRAVENOUS
  Filled 2014-06-14: qty 2

## 2014-06-14 MED ORDER — CYCLOBENZAPRINE HCL 10 MG PO TABS
10.0000 mg | ORAL_TABLET | Freq: Three times a day (TID) | ORAL | Status: DC | PRN
  Administered 2014-06-14 – 2014-06-15 (×2): 10 mg via ORAL
  Filled 2014-06-14 (×2): qty 1

## 2014-06-14 MED ORDER — OXYCODONE HCL 5 MG PO TABS
5.0000 mg | ORAL_TABLET | Freq: Once | ORAL | Status: AC
  Administered 2014-06-14: 5 mg via ORAL
  Filled 2014-06-14: qty 1

## 2014-06-14 MED ORDER — ASPIRIN EC 81 MG PO TBEC
81.0000 mg | DELAYED_RELEASE_TABLET | Freq: Every day | ORAL | Status: DC
  Administered 2014-06-14 – 2014-06-16 (×3): 81 mg via ORAL
  Filled 2014-06-14 (×3): qty 1

## 2014-06-14 MED ORDER — DIPHENHYDRAMINE HCL 50 MG/ML IJ SOLN
25.0000 mg | Freq: Once | INTRAMUSCULAR | Status: AC
  Administered 2014-06-14: 25 mg via INTRAVENOUS
  Filled 2014-06-14: qty 1

## 2014-06-14 MED ORDER — ACETAMINOPHEN 325 MG PO TABS: 650.0000 mg | ORAL_TABLET | Freq: Four times a day (QID) | ORAL | Status: DC | PRN

## 2014-06-14 MED ORDER — GABAPENTIN 300 MG PO CAPS
300.0000 mg | ORAL_CAPSULE | Freq: Three times a day (TID) | ORAL | Status: DC
  Administered 2014-06-14 – 2014-06-16 (×6): 300 mg via ORAL
  Filled 2014-06-14 (×6): qty 1

## 2014-06-14 MED ORDER — KETOROLAC TROMETHAMINE 30 MG/ML IJ SOLN
30.0000 mg | Freq: Three times a day (TID) | INTRAMUSCULAR | Status: DC | PRN
  Administered 2014-06-14 – 2014-06-16 (×5): 30 mg via INTRAVENOUS
  Filled 2014-06-14 (×6): qty 1

## 2014-06-14 MED ORDER — ACETAMINOPHEN 650 MG RE SUPP: 650.0000 mg | Freq: Four times a day (QID) | RECTAL | Status: DC | PRN

## 2014-06-14 MED ORDER — HEPARIN SODIUM (PORCINE) 5000 UNIT/ML IJ SOLN
5000.0000 [IU] | Freq: Three times a day (TID) | INTRAMUSCULAR | Status: DC
  Administered 2014-06-14 – 2014-06-16 (×6): 5000 [IU] via SUBCUTANEOUS
  Filled 2014-06-14 (×6): qty 1

## 2014-06-14 MED ORDER — SODIUM CHLORIDE 0.9 % IJ SOLN
3.0000 mL | Freq: Two times a day (BID) | INTRAMUSCULAR | Status: DC
  Administered 2014-06-14 – 2014-06-15 (×2): 3 mL via INTRAVENOUS

## 2014-06-14 MED ORDER — ALPRAZOLAM 0.5 MG PO TABS
1.0000 mg | ORAL_TABLET | Freq: Once | ORAL | Status: AC
  Administered 2014-06-14: 1 mg via ORAL
  Filled 2014-06-14: qty 2

## 2014-06-14 MED ORDER — IOHEXOL 350 MG/ML SOLN
130.0000 mL | Freq: Once | INTRAVENOUS | Status: AC | PRN
  Administered 2014-06-14: 130 mL via INTRAVENOUS

## 2014-06-14 MED ORDER — SODIUM CHLORIDE 0.9 % IV BOLUS (SEPSIS)
500.0000 mL | Freq: Once | INTRAVENOUS | Status: AC
  Administered 2014-06-14: 500 mL via INTRAVENOUS

## 2014-06-14 MED ORDER — SODIUM CHLORIDE 0.9 % IV BOLUS (SEPSIS): 500.0000 mL | Freq: Once | INTRAVENOUS | Status: DC

## 2014-06-14 MED ORDER — ATOMOXETINE HCL 40 MG PO CAPS
40.0000 mg | ORAL_CAPSULE | Freq: Every day | ORAL | Status: DC
  Administered 2014-06-14 – 2014-06-16 (×3): 40 mg via ORAL
  Filled 2014-06-14 (×3): qty 1

## 2014-06-14 NOTE — Progress Notes (Signed)
Pt is admitted to 4N14 from ED. Admission vitals is stable

## 2014-06-14 NOTE — Code Documentation (Signed)
49[facility] [id number].  Patient reports acute onset dizziness, headache and left sided weakness starting yesterday at 2200.  Symptoms did not resolve and patient went to sleep.  Patient woke up this morning at 0630 with continued symptoms.  Patient went to the fire department where EMS was called and activated Code Stroke.  Stroke team at the bedside on arrival.  Patient cleared by Dr. Jodi MourningZavitz.  Patient to CT.  NIHSS 5, see documentation for details and code stroke times.  Patient with continued left sided weakness and mild left facial droop.  Patient continues to report dizziness, headache and blurred vision.  Dr. Thad Rangereynolds at the bedside.  Patient is outside the window for treatment with tPA.  Bedside handoff with ED RN Joni ReiningNicole.

## 2014-06-14 NOTE — Consult Note (Addendum)
Referring Physician: Jodi MourningZavitz    Chief Complaint: HA, left sided numbness, blurry vision, difficulty with gait  HPI: [name] [name] is an 49 y.o. male who has a history of headaches who reports that last night he began to have a headache.  Headache is on the right side of his head.  He has associated blurry vision, nausea, difficulty with gait and left sided numbness.  He reports that he went to sleep after the onset hoping that he would sleep it off.  When he awakened at 0630 he continued to have his symptoms.  With no resolution EMS was called and patient was called in as a code stroke.  Patient has a history of headaches but no history of associated symptoms.    Date last known well: Date: 06/13/2014 Time last known well: Time: 22:00 tPA Given: No: Outside treatment window  Past Medical History  Diagnosis Date  . Back pain   . MI (myocardial infarction)     at age 49    Past Surgical History  Procedure Laterality Date  . Foot surgery      Family History  Problem Relation Age of Onset  . Heart failure Mother   . Hypertension Mother   . Stroke Mother    Social History:  reports that he has been smoking Cigarettes.  He does not have any smokeless tobacco history on file. He reports that he does not drink alcohol. His drug history is not on file.  Allergies:  Allergies  Allergen Reactions  . Vicodin [Hydrocodone-Acetaminophen] Nausea And Vomiting    Medications: I have reviewed the patient's current medications. Prior to Admission:  Current outpatient prescriptions:  .  ALPRAZolam (XANAX) 1 MG tablet, Take 1 mg by mouth 4 (four) times daily - after meals and at bedtime. For anxiety, Disp: , Rfl:  .  aspirin 81 MG tablet, Take 81 mg by mouth daily., Disp: , Rfl:  .  ibuprofen (ADVIL,MOTRIN) 800 MG tablet, Take 1 tablet (800 mg total) by mouth 3 (three) times daily. (Patient taking differently: Take 800 mg by mouth 3 (three) times daily as needed for moderate pain. ), Disp: 21  tablet, Rfl: 0 .  Multiple Vitamin (MULITIVITAMIN WITH MINERALS) TABS, Take 1 tablet by mouth daily., Disp: , Rfl:  .  cyclobenzaprine (FLEXERIL) 10 MG tablet, Take 1 tablet (10 mg total) by mouth 2 (two) times daily as needed for muscle spasms., Disp: 20 tablet, Rfl: 0 .  traMADol (ULTRAM) 50 MG tablet, Take 1 tablet (50 mg total) by mouth every 6 (six) hours as needed., Disp: 15 tablet, Rfl: 0  ROS: History obtained from the patient  General ROS: negative for - chills, fatigue, fever, night sweats, weight gain or weight loss Psychological ROS: negative for - behavioral disorder, hallucinations, memory difficulties, mood swings or suicidal ideation Ophthalmic ROS: negative for - blurry vision, double vision, eye pain or loss of vision ENT ROS: negative for - epistaxis, nasal discharge, oral lesions, sore throat, tinnitus or vertigo Allergy and Immunology ROS: negative for - hives or itchy/watery eyes Hematological and Lymphatic ROS: negative for - bleeding problems, bruising or swollen lymph nodes Endocrine ROS: negative for - galactorrhea, hair pattern changes, polydipsia/polyuria or temperature intolerance Respiratory ROS: negative for - cough, hemoptysis, shortness of breath or wheezing Cardiovascular ROS: negative for - chest pain, dyspnea on exertion, edema or irregular heartbeat Gastrointestinal ROS: negative for - abdominal pain, diarrhea, hematemesis, nausea/vomiting or stool incontinence Genito-Urinary ROS: negative for - dysuria, hematuria, incontinence or  urinary frequency/urgency Musculoskeletal ROS: right leg pain Neurological ROS: as noted in HPI, numbness on let when lying on the left Dermatological ROS: negative for rash and skin lesion changes  Physical Examination: Blood pressure 105/71, pulse 61, temperature 97.7 F (36.5 C), temperature source Oral, resp. rate 14, height 6' (1.829 m), weight 90.776 kg (200 lb 2 oz), SpO2 100 %.  HEENT-  Normocephalic, no lesions,  without obvious abnormality.  Normal external eye and conjunctiva.  Normal TM's bilaterally.  Normal auditory canals and external ears. Normal external nose, mucus membranes and septum.  Normal pharynx. Cardiovascular- S1, S2 normal, pulses palpable throughout   Lungs- chest clear, no wheezing, rales, normal symmetric air entry Abdomen- soft, non-tender; bowel sounds normal; no masses,  no organomegaly Extremities- no edema Lymph-no adenopathy palpable Musculoskeletal-no joint tenderness, deformity or swelling Skin-warm and dry, no hyperpigmentation, vitiligo, or suspicious lesions  Neurological Examination Mental Status: Alert, oriented, thought content appropriate.  Speech fluent without evidence of aphasia.  Able to follow 3 step commands without difficulty. Cranial Nerves: II: Discs flat bilaterally; Visual fields grossly normal, pupils equal, round, reactive to light and accommodation III,IV, VI: ptosis not present, extra-ocular motions intact bilaterally V,VII: smile symmetric, facial light touch sensation decreased on the left VIII: hearing normal bilaterally IX,X: gag reflex present XI: bilateral shoulder shrug XII: midline tongue extension Motor: Right : Upper extremity   5/5    Left:     Upper extremity   5/5 Give-way weakness in the BLE's Tone and bulk:normal tone throughout; no atrophy noted Sensory: Pinprick and light touch decreased on the left Deep Tendon Reflexes: 2+ and symmetric throughout Plantars: Right: downgoing   Left: downgoing Cerebellar: normal finger-to-nose and normal heel-to-shin testing bilaterally   Laboratory Studies:  Basic Metabolic Panel:  Recent Labs Lab 06/14/14 1109 06/14/14 1117  NA 139 140  K 3.9 3.8  CL 106 101  CO2 30  --   GLUCOSE 110* 107*  BUN 10 14  CREATININE 1.26 1.10  CALCIUM 8.8  --     Liver Function Tests:  Recent Labs Lab 06/14/14 1109  AST 20  ALT 14  ALKPHOS 102  BILITOT 0.7  PROT 6.4  ALBUMIN 4.0   No  results for input(s): LIPASE, AMYLASE in the last 168 hours. No results for input(s): AMMONIA in the last 168 hours.  CBC:  Recent Labs Lab 06/14/14 1109 06/14/14 1117  WBC 9.3  --   NEUTROABS 6.3  --   HGB 14.8 15.3  HCT 44.1 45.0  MCV 89.5  --   PLT 185  --     Cardiac Enzymes: No results for input(s): CKTOTAL, CKMB, CKMBINDEX, TROPONINI in the last 168 hours.  BNP: Invalid input(s): POCBNP  CBG: No results for input(s): GLUCAP in the last 168 hours.  Microbiology: No results found for this or any previous visit.  Coagulation Studies:  Recent Labs  06/14/14 1109  LABPROT 12.6  INR 0.93    Urinalysis: No results for input(s): COLORURINE, LABSPEC, PHURINE, GLUCOSEU, HGBUR, BILIRUBINUR, KETONESUR, PROTEINUR, UROBILINOGEN, NITRITE, LEUKOCYTESUR in the last 168 hours.  Invalid input(s): APPERANCEUR  Lipid Panel: No results found for: CHOL, TRIG, HDL, CHOLHDL, VLDL, LDLCALC  HgbA1C: No results found for: HGBA1C  Urine Drug Screen:  No results found for: LABOPIA, COCAINSCRNUR, LABBENZ, AMPHETMU, THCU, LABBARB  Alcohol Level:   Recent Labs Lab 06/14/14 1109  ETH <5    Other results: EKG: sinus rhythm at 64 bpm.  Imaging: Ct Head Wo Contrast  06/14/2014  CLINICAL DATA:  Code stroke, headache, dizziness, left arm numbness  EXAM: CT HEAD WITHOUT CONTRAST  TECHNIQUE: Contiguous axial images were obtained from the base of the skull through the vertex without intravenous contrast.  COMPARISON:  None.  FINDINGS: No skull fracture is noted. Paranasal sinuses and mastoid air cells are unremarkable.  No intracranial hemorrhage, mass effect or midline shift. No definite acute cortical infarction. No mass lesion is noted on this unenhanced scan.  IMPRESSION: No acute intracranial abnormality. No definite acute cortical infarction. These results were called by telephone at the time of interpretation on 06/14/2014 at 11:22 am to Dr. Thad Ranger, who verbally acknowledged these  results.   Electronically Signed   By: Natasha Mead M.D.   On: 06/14/2014 11:22    Assessment: 49 y.o. male presenting with headache, blurred vision, difficulty with gait, and left sided numbness.  NIHSS of 5.  Patient outside treatment window for tPA.  Not a candidate for intervention but can not rule out a posterior circulation thrombus.  Further work up recommended.  Head CT personally reviewed and shows no acute changes.    Stroke Risk Factors - none  Plan: 1. CTA of head and neck.  Will follow up and make further recommendations for management once results available.   2. Analgesia for headache   Case discussed with Dr. Karen Kitchens, MD [facility] [phone] 06/14/2014, 12:22 PM   Addendum: CTA of head and neck personally reviewed and shows no proximal stenosis or dissection.  Patient improved with headache better.  Speech at baseline.  No further weakness but does continue to have left sided numbness.  Recommendations: 1.  MRI of the brain without contrast.  Would not proceed with stroke work up unless MRI indicative of an acute event.    Thana Farr, MD [facility] [phone]

## 2014-06-14 NOTE — H&P (Signed)
[facility] History and Physical  [name] CaddyRobert K [name] ZOX:[id number] DOB: Mar 30, 1965 DOA: 06/14/2014  Referring physician: EDP PCP: Joanna HewsATE,ALLEN D, MD   Chief Complaint: Left-sided weakness  HPI: [name] [name] is a 49 y.o. male with past medical history of chronic back pain and reportedly previous MI. Patient came into the hospital complaining about left-sided weakness. Patient said yesterday he was not feeling right, he couldn't elaborate, but he went to bed and this morning per his fianc at bedside he speech was slurred, he felt left-sided weakness and he went to work actually, and come to the ED when it's not getting better. Was accompanied by nausea and dizziness, patient also mentions some chest pain radiates to his left arm. In the ED his chest pain resolved, patient has subjective left-sided weakness without objective findings of weakness. CTA of the head/neck vessels done and showed no evidence of stroke, CT of the head showed no acute findings. CT of the chest with contrast showed no acute abnormalities. Patient referred for observation for further workup.  Review of Systems:  Constitutional: negative for anorexia, fevers and sweats Eyes: negative for irritation, redness and visual disturbance Ears, nose, mouth, throat, and face: negative for earaches, epistaxis, nasal congestion and sore throat Respiratory: negative for cough, dyspnea on exertion, sputum and wheezing Cardiovascular: negative for chest pain, dyspnea, lower extremity edema, orthopnea, palpitations and syncope Gastrointestinal: negative for abdominal pain, constipation, diarrhea, melena, nausea and vomiting Genitourinary:negative for dysuria, frequency and hematuria Hematologic/lymphatic: negative for bleeding, easy bruising and lymphadenopathy Musculoskeletal: Left-sided weakness Neurological: negative for coordination problems, gait problems, headaches and weakness Endocrine: negative for diabetic symptoms including  polydipsia, polyuria and weight loss Allergic/Immunologic: negative for anaphylaxis, hay fever and urticaria  Past Medical History  Diagnosis Date  . Back pain   . MI (myocardial infarction)     at age 89   Past Surgical History  Procedure Laterality Date  . Foot surgery     Social History:   reports that he has been smoking Cigarettes.  He does not have any smokeless tobacco history on file. He reports that he does not drink alcohol. His drug history is not on file.  Allergies  Allergen Reactions  . Vicodin [Hydrocodone-Acetaminophen] Nausea And Vomiting    Family History  Problem Relation Age of Onset  . Heart failure Mother   . Hypertension Mother   . Stroke Mother      Prior to Admission medications   Medication Sig Start Date End Date Taking? Authorizing Provider  ALPRAZolam Prudy Feeler(XANAX) 1 MG tablet Take 1 mg by mouth 4 (four) times daily - after meals and at bedtime. For anxiety   Yes Historical Provider, MD  aspirin 81 MG tablet Take 81 mg by mouth daily.   Yes Historical Provider, MD  ibuprofen (ADVIL,MOTRIN) 800 MG tablet Take 1 tablet (800 mg total) by mouth 3 (three) times daily. Patient taking differently: Take 800 mg by mouth 3 (three) times daily as needed for moderate pain.  03/05/14  Yes Hope Orlene OchM Neese, NP  Multiple Vitamin (MULITIVITAMIN WITH MINERALS) TABS Take 1 tablet by mouth daily.   Yes Historical Provider, MD  cyclobenzaprine (FLEXERIL) 10 MG tablet Take 1 tablet (10 mg total) by mouth 2 (two) times daily as needed for muscle spasms. 03/05/14   Hope Orlene OchM Neese, NP  traMADol (ULTRAM) 50 MG tablet Take 1 tablet (50 mg total) by mouth every 6 (six) hours as needed. 03/05/14   Hope Orlene OchM Neese, NP   Physical Exam: [name] MonsFiled  Vitals:   06/14/14 1430  BP: 111/71  Pulse: 81  Temp:   Resp: 17   Constitutional: Oriented to person, place, and time. Well-developed and well-nourished. Cooperative.  Head: Normocephalic and atraumatic.  Nose: Nose normal.  Mouth/Throat: Uvula  is midline, oropharynx is clear and moist and mucous membranes are normal.  Eyes: Conjunctivae and EOM are normal. Pupils are equal, round, and reactive to light.  Neck: Trachea normal and normal range of motion. Neck supple.  Cardiovascular: Normal rate, regular rhythm, S1 normal, S2 normal, normal heart sounds and intact distal pulses.   Pulmonary/Chest: Effort normal and breath sounds normal.  Abdominal: Soft. Bowel sounds are normal. There is no hepatosplenomegaly. There is no tenderness.  Musculoskeletal: Normal range of motion.  Neurological: Alert and oriented to person, place, and time. Has normal strength. No cranial nerve deficit or sensory deficit.  Skin: Skin is warm, dry and intact.  Psychiatric: Has a normal mood and affect. Speech is normal and behavior is normal.   Labs on Admission:  Basic Metabolic Panel:  Recent Labs Lab 06/14/14 1109 06/14/14 1117  NA 139 140  K 3.9 3.8  CL 106 101  CO2 30  --   GLUCOSE 110* 107*  BUN 10 14  CREATININE 1.26 1.10  CALCIUM 8.8  --    Liver Function Tests:  Recent Labs Lab 06/14/14 1109  AST 20  ALT 14  ALKPHOS 102  BILITOT 0.7  PROT 6.4  ALBUMIN 4.0   No results for input(s): LIPASE, AMYLASE in the last 168 hours. No results for input(s): AMMONIA in the last 168 hours. CBC:  Recent Labs Lab 06/14/14 1109 06/14/14 1117  WBC 9.3  --   NEUTROABS 6.3  --   HGB 14.8 15.3  HCT 44.1 45.0  MCV 89.5  --   PLT 185  --    Cardiac Enzymes:  Recent Labs Lab 06/14/14 1349  TROPONINI <0.03    BNP (last 3 results) No results for input(s): BNP in the last 8760 hours.  ProBNP (last 3 results) No results for input(s): PROBNP in the last 8760 hours.  CBG: No results for input(s): GLUCAP in the last 168 hours.  Radiological Exams on Admission: Ct Angio Head W/cm &/or Wo Cm  06/14/2014   CLINICAL DATA:  Left-sided arm numbness and weakness, left facial droop, slurred speech, vision changes, and unsteady gait.   EXAM: CT ANGIOGRAPHY HEAD AND NECK  TECHNIQUE: Multidetector CT imaging of the head and neck was performed using the standard protocol during bolus administration of intravenous contrast. Multiplanar CT image reconstructions and MIPs were obtained to evaluate the vascular anatomy. Carotid stenosis measurements (when applicable) are obtained utilizing NASCET criteria, using the distal internal carotid diameter as the denominator.  CONTRAST:  50 mL Omnipaque 350  COMPARISON:  Noncontrast head CT earlier today  FINDINGS: CT HEAD  Brain: There is no evidence of acute cortical infarct, intracranial hemorrhage, mass, midline shift, or extra-axial fluid collection. Ventricles and sulci are normal.  Calvarium and skull base: No aggressive osseous lesions identified.  Paranasal sinuses: Clear.  Orbits: Unremarkable.  CTA NECK  Aortic arch: 3 vessel aortic arch. Brachiocephalic and subclavian arteries are widely patent, with minimal calcification noted at the left subclavian artery origin.  Right carotid system: Patent without evidence of stenosis, dissection, or aneurysm.  Left carotid system: Patent without evidence of stenosis, dissection, or aneurysm. Minimal calcified plaque at the carotid bifurcation.  Vertebral arteries: Patent without stenosis. Right vertebral artery is minimally larger  than the left.  Skeleton: Mild multilevel cervical disc degeneration and facet arthrosis.  Other neck: Prominent periapical lucency about the left mandibular third molar.  CTA HEAD  Anterior circulation: Internal carotid arteries are patent from skullbase to carotid termini without stenosis. ACAs and MCAs are patent with mild branch vessel irregularity but no significant proximal stenosis. No intracranial aneurysm is identified.  Posterior circulation: Intracranial vertebral arteries are patent to the basilar bilaterally without stenosis. PICA and SCA origins are patent. Basilar artery is patent without stenosis. PCAs are patent with  mild irregularity involving the left P1 segment and of P2 and more distal branch vessels bilaterally. There is mild narrowing versus artifact of the distal left P1 segment. Posterior communicating arteries are not identified.  Venous sinuses: Patent.  Anatomic variants: None.  Delayed phase: No abnormal enhancement.  IMPRESSION: 1. No evidence of major intracranial arterial occlusion or significant proximal stenosis. Anterior and posterior circulation branch vessel irregularity may reflect small vessel atherosclerosis. 2. No cervical carotid or vertebral artery stenosis. 3. No evidence of acute intracranial abnormality or mass.   Electronically Signed   By: Sebastian Ache   On: 06/14/2014 13:58   Ct Head Wo Contrast  06/14/2014   CLINICAL DATA:  Code stroke, headache, dizziness, left arm numbness  EXAM: CT HEAD WITHOUT CONTRAST  TECHNIQUE: Contiguous axial images were obtained from the base of the skull through the vertex without intravenous contrast.  COMPARISON:  None.  FINDINGS: No skull fracture is noted. Paranasal sinuses and mastoid air cells are unremarkable.  No intracranial hemorrhage, mass effect or midline shift. No definite acute cortical infarction. No mass lesion is noted on this unenhanced scan.  IMPRESSION: No acute intracranial abnormality. No definite acute cortical infarction. These results were called by telephone at the time of interpretation on 06/14/2014 at 11:22 am to Dr. Thad Ranger, who verbally acknowledged these results.   Electronically Signed   By: Natasha Mead M.D.   On: 06/14/2014 11:22   Ct Angio Neck W/cm &/or Wo/cm  06/14/2014   CLINICAL DATA:  Left-sided arm numbness and weakness, left facial droop, slurred speech, vision changes, and unsteady gait.  EXAM: CT ANGIOGRAPHY HEAD AND NECK  TECHNIQUE: Multidetector CT imaging of the head and neck was performed using the standard protocol during bolus administration of intravenous contrast. Multiplanar CT image reconstructions and MIPs  were obtained to evaluate the vascular anatomy. Carotid stenosis measurements (when applicable) are obtained utilizing NASCET criteria, using the distal internal carotid diameter as the denominator.  CONTRAST:  50 mL Omnipaque 350  COMPARISON:  Noncontrast head CT earlier today  FINDINGS: CT HEAD  Brain: There is no evidence of acute cortical infarct, intracranial hemorrhage, mass, midline shift, or extra-axial fluid collection. Ventricles and sulci are normal.  Calvarium and skull base: No aggressive osseous lesions identified.  Paranasal sinuses: Clear.  Orbits: Unremarkable.  CTA NECK  Aortic arch: 3 vessel aortic arch. Brachiocephalic and subclavian arteries are widely patent, with minimal calcification noted at the left subclavian artery origin.  Right carotid system: Patent without evidence of stenosis, dissection, or aneurysm.  Left carotid system: Patent without evidence of stenosis, dissection, or aneurysm. Minimal calcified plaque at the carotid bifurcation.  Vertebral arteries: Patent without stenosis. Right vertebral artery is minimally larger than the left.  Skeleton: Mild multilevel cervical disc degeneration and facet arthrosis.  Other neck: Prominent periapical lucency about the left mandibular third molar.  CTA HEAD  Anterior circulation: Internal carotid arteries are patent from skullbase to carotid  termini without stenosis. ACAs and MCAs are patent with mild branch vessel irregularity but no significant proximal stenosis. No intracranial aneurysm is identified.  Posterior circulation: Intracranial vertebral arteries are patent to the basilar bilaterally without stenosis. PICA and SCA origins are patent. Basilar artery is patent without stenosis. PCAs are patent with mild irregularity involving the left P1 segment and of P2 and more distal branch vessels bilaterally. There is mild narrowing versus artifact of the distal left P1 segment. Posterior communicating arteries are not identified.  Venous  sinuses: Patent.  Anatomic variants: None.  Delayed phase: No abnormal enhancement.  IMPRESSION: 1. No evidence of major intracranial arterial occlusion or significant proximal stenosis. Anterior and posterior circulation branch vessel irregularity may reflect small vessel atherosclerosis. 2. No cervical carotid or vertebral artery stenosis. 3. No evidence of acute intracranial abnormality or mass.   Electronically Signed   By: Sebastian Ache   On: 06/14/2014 13:58   Ct Angio Chest Aorta W/cm &/or Wo/cm  06/14/2014   CLINICAL DATA:  Acute chest pain  EXAM: CT ANGIOGRAPHY CHEST WITH CONTRAST  TECHNIQUE: Multidetector CT imaging of the chest was performed using the standard protocol during bolus administration of intravenous contrast. Multiplanar CT image reconstructions and MIPs were obtained to evaluate the vascular anatomy.  CONTRAST:  OMNIPAQUE IOHEXOL 350 MG/ML SOLN  COMPARISON:  CT chest 10/16/2013  FINDINGS: Negative for pulmonary embolism. Negative for aortic aneurysm or dissection. Heart size is normal. No pericardial effusion.  Negative for mass or adenopathy. Lungs are clear. Negative for pneumonia or effusion.  Negative upper abdomen  Review of the MIP images confirms the above findings.  IMPRESSION: Negative for pulmonary embolism. No aortic dissection. No acute abnormality.   Electronically Signed   By: Marlan Palau M.D.   On: 06/14/2014 13:40    EKG: Independently reviewed, sinus rhythm at 64.   Assessment/Plan Principal Problem:   Left-sided weakness Active Problems:   Chest pain   Chronic joint pain    Left-sided weakness Left-sided weakness with numbness, improving since this morning. Patient has negative CT angiography of the head/neck vessels as well as CT angiography of the chest. Patient seen by neurology and recommended no further workup. His left-sided weakness appears to be subjective, patient has really good/powerful grip and no pronator drift.  Chest  pain Patient reported he had heart attack before, denies chest pain right now. Cycle 3 sets of cardiac enzymes to rule out acute cord syndrome.  Polysubstance abuse Patient urinalysis is positive for benzos, cocaine and THC. Rule out ACS as cocaine can cause acute coronary spasm.  Chronic joint pain Right ankle pain secondary to previous fracture and chronic pain. Patient takes Tylenol/Advil for that as needed.   Code Status: Full code Family Communication: Plan discussed in the presence of his fianc at bedside Disposition Plan: Observation, telemetry  Time spent: 70 minutes  Mirtie Bastyr A, MD [facility] Pager [phone]

## 2014-06-14 NOTE — ED Provider Notes (Signed)
CSN: [id number]     Arrival date & time 06/14/14  1107 History   First MD Initiated Contact with Patient 06/14/14 1108     Chief Complaint  Patient presents with  . Code Stroke    @ (Consider location/radiation/quality/duration/timing/severity/associated sxs/prior Treatment) HPI Comments: 49 year old male with history of heart attack no stents, no current cardiologist, every day smoker, denies stroke history presents as code stroke to the ER. Reportedly patient had dizziness, generalized headache, left facial droop and left sided weakness that started this morning, patient clarified that started last night got better and then worsened this morning. No history of similar. Patient says symptoms mild improved since and still persistent. Patient developed chest pain while in the ER pressure sensation mild abrasion to left shoulder. Patient has had intermittent chest pain for the past month, has not been taking aspirin recently although it was previous and recommended. No infectious type symptoms. No blood clot history, no recent surgeries no active cancer.  The history is provided by the patient.    Past Medical History  Diagnosis Date  . Back pain   . MI (myocardial infarction)     at age 60   Past Surgical History  Procedure Laterality Date  . Foot surgery     Family History  Problem Relation Age of Onset  . Heart failure Mother   . Hypertension Mother   . Stroke Mother    History  Substance Use Topics  . Smoking status: Current Every Day Smoker    Types: Cigarettes  . Smokeless tobacco: Not on file  . Alcohol Use: No    Review of Systems  Constitutional: Positive for appetite change. Negative for fever and chills.  HENT: Negative for congestion.   Eyes: Negative for visual disturbance.  Respiratory: Negative for shortness of breath.   Cardiovascular: Positive for chest pain. Negative for leg swelling.  Gastrointestinal: Negative for vomiting and abdominal pain.   Genitourinary: Negative for dysuria and flank pain.  Musculoskeletal: Negative for back pain, neck pain and neck stiffness.  Skin: Negative for rash.  Neurological: Positive for dizziness, weakness, light-headedness, numbness and headaches.      Allergies  Vicodin  Home Medications   Prior to Admission medications   Medication Sig Start Date End Date Taking? Authorizing Provider  ALPRAZolam Prudy Feeler) 1 MG tablet Take 1 mg by mouth 4 (four) times daily - after meals and at bedtime. For anxiety   Yes Historical Provider, MD  aspirin 81 MG tablet Take 81 mg by mouth daily.   Yes Historical Provider, MD  ibuprofen (ADVIL,MOTRIN) 800 MG tablet Take 1 tablet (800 mg total) by mouth 3 (three) times daily. Patient taking differently: Take 800 mg by mouth 3 (three) times daily as needed for moderate pain.  03/05/14  Yes Hope Orlene Och, NP  Multiple Vitamin (MULITIVITAMIN WITH MINERALS) TABS Take 1 tablet by mouth daily.   Yes Historical Provider, MD  cyclobenzaprine (FLEXERIL) 10 MG tablet Take 1 tablet (10 mg total) by mouth 2 (two) times daily as needed for muscle spasms. 03/05/14   Hope Orlene Och, NP  traMADol (ULTRAM) 50 MG tablet Take 1 tablet (50 mg total) by mouth every 6 (six) hours as needed. 03/05/14   Hope Orlene Och, NP   BP 111/71 mmHg  Pulse 81  Temp(Src) 97.8 F (36.6 C) (Oral)  Resp 17  Ht 6' (1.829 m)  Wt 200 lb 2 oz (90.776 kg)  BMI 27.14 kg/m2  SpO2 100% Physical Exam  Constitutional: He  is oriented to person, place, and time. He appears well-developed and well-nourished.  HENT:  Head: Normocephalic and atraumatic.  Eyes: Conjunctivae are normal. Right eye exhibits no discharge. Left eye exhibits no discharge.  Neck: Normal range of motion. Neck supple. No tracheal deviation present.  Cardiovascular: Normal rate and regular rhythm.   Pulmonary/Chest: Effort normal and breath sounds normal.  Abdominal: Soft. He exhibits no distension. There is no tenderness. There is no  guarding.  Musculoskeletal: He exhibits no edema.  Neurological: He is alert and oriented to person, place, and time. GCS eye subscore is 4. GCS verbal subscore is 5. GCS motor subscore is 6.  Subtle left arm weakness versus right, no significant arm or leg drift, exam. No slurred speech appreciated. No facial droop. Pupils equal ask the muscle function intact, sensation intact bilateral, neck supple. Pulses equal upper extremities.  Skin: Skin is warm. No rash noted.  Psychiatric: He has a normal mood and affect.  Nursing note and vitals reviewed.   ED Course  Procedures (including critical care time) Labs Review Labs Reviewed  COMPREHENSIVE METABOLIC PANEL - Abnormal; Notable for the following:    Glucose, Bld 110 (*)    GFR calc non Af Amer 67 (*)    GFR calc Af Amer 78 (*)    Anion gap 3 (*)    All other components within normal limits  URINE RAPID DRUG SCREEN (HOSP PERFORMED) - Abnormal; Notable for the following:    Cocaine POSITIVE (*)    Benzodiazepines POSITIVE (*)    Tetrahydrocannabinol POSITIVE (*)    All other components within normal limits  I-STAT CHEM 8, ED - Abnormal; Notable for the following:    Glucose, Bld 107 (*)    All other components within normal limits  ETHANOL  PROTIME-INR  APTT  CBC  DIFFERENTIAL  URINALYSIS, ROUTINE W REFLEX MICROSCOPIC  TROPONIN I  I-STAT TROPOININ, ED  I-STAT TROPOININ, ED    Imaging Review Ct Angio Head W/cm &/or Wo Cm  06/14/2014   CLINICAL DATA:  Left-sided arm numbness and weakness, left facial droop, slurred speech, vision changes, and unsteady gait.  EXAM: CT ANGIOGRAPHY HEAD AND NECK  TECHNIQUE: Multidetector CT imaging of the head and neck was performed using the standard protocol during bolus administration of intravenous contrast. Multiplanar CT image reconstructions and MIPs were obtained to evaluate the vascular anatomy. Carotid stenosis measurements (when applicable) are obtained utilizing NASCET criteria, using the  distal internal carotid diameter as the denominator.  CONTRAST:  50 mL Omnipaque 350  COMPARISON:  Noncontrast head CT earlier today  FINDINGS: CT HEAD  Brain: There is no evidence of acute cortical infarct, intracranial hemorrhage, mass, midline shift, or extra-axial fluid collection. Ventricles and sulci are normal.  Calvarium and skull base: No aggressive osseous lesions identified.  Paranasal sinuses: Clear.  Orbits: Unremarkable.  CTA NECK  Aortic arch: 3 vessel aortic arch. Brachiocephalic and subclavian arteries are widely patent, with minimal calcification noted at the left subclavian artery origin.  Right carotid system: Patent without evidence of stenosis, dissection, or aneurysm.  Left carotid system: Patent without evidence of stenosis, dissection, or aneurysm. Minimal calcified plaque at the carotid bifurcation.  Vertebral arteries: Patent without stenosis. Right vertebral artery is minimally larger than the left.  Skeleton: Mild multilevel cervical disc degeneration and facet arthrosis.  Other neck: Prominent periapical lucency about the left mandibular third molar.  CTA HEAD  Anterior circulation: Internal carotid arteries are patent from skullbase to carotid termini without stenosis.  ACAs and MCAs are patent with mild branch vessel irregularity but no significant proximal stenosis. No intracranial aneurysm is identified.  Posterior circulation: Intracranial vertebral arteries are patent to the basilar bilaterally without stenosis. PICA and SCA origins are patent. Basilar artery is patent without stenosis. PCAs are patent with mild irregularity involving the left P1 segment and of P2 and more distal branch vessels bilaterally. There is mild narrowing versus artifact of the distal left P1 segment. Posterior communicating arteries are not identified.  Venous sinuses: Patent.  Anatomic variants: None.  Delayed phase: No abnormal enhancement.  IMPRESSION: 1. No evidence of major intracranial arterial  occlusion or significant proximal stenosis. Anterior and posterior circulation branch vessel irregularity may reflect small vessel atherosclerosis. 2. No cervical carotid or vertebral artery stenosis. 3. No evidence of acute intracranial abnormality or mass.   Electronically Signed   By: Sebastian Ache   On: 06/14/2014 13:58   Ct Head Wo Contrast  06/14/2014   CLINICAL DATA:  Code stroke, headache, dizziness, left arm numbness  EXAM: CT HEAD WITHOUT CONTRAST  TECHNIQUE: Contiguous axial images were obtained from the base of the skull through the vertex without intravenous contrast.  COMPARISON:  None.  FINDINGS: No skull fracture is noted. Paranasal sinuses and mastoid air cells are unremarkable.  No intracranial hemorrhage, mass effect or midline shift. No definite acute cortical infarction. No mass lesion is noted on this unenhanced scan.  IMPRESSION: No acute intracranial abnormality. No definite acute cortical infarction. These results were called by telephone at the time of interpretation on 06/14/2014 at 11:22 am to Dr. Thad Ranger, who verbally acknowledged these results.   Electronically Signed   By: Natasha Mead M.D.   On: 06/14/2014 11:22   Ct Angio Neck W/cm &/or Wo/cm  06/14/2014   CLINICAL DATA:  Left-sided arm numbness and weakness, left facial droop, slurred speech, vision changes, and unsteady gait.  EXAM: CT ANGIOGRAPHY HEAD AND NECK  TECHNIQUE: Multidetector CT imaging of the head and neck was performed using the standard protocol during bolus administration of intravenous contrast. Multiplanar CT image reconstructions and MIPs were obtained to evaluate the vascular anatomy. Carotid stenosis measurements (when applicable) are obtained utilizing NASCET criteria, using the distal internal carotid diameter as the denominator.  CONTRAST:  50 mL Omnipaque 350  COMPARISON:  Noncontrast head CT earlier today  FINDINGS: CT HEAD  Brain: There is no evidence of acute cortical infarct, intracranial hemorrhage,  mass, midline shift, or extra-axial fluid collection. Ventricles and sulci are normal.  Calvarium and skull base: No aggressive osseous lesions identified.  Paranasal sinuses: Clear.  Orbits: Unremarkable.  CTA NECK  Aortic arch: 3 vessel aortic arch. Brachiocephalic and subclavian arteries are widely patent, with minimal calcification noted at the left subclavian artery origin.  Right carotid system: Patent without evidence of stenosis, dissection, or aneurysm.  Left carotid system: Patent without evidence of stenosis, dissection, or aneurysm. Minimal calcified plaque at the carotid bifurcation.  Vertebral arteries: Patent without stenosis. Right vertebral artery is minimally larger than the left.  Skeleton: Mild multilevel cervical disc degeneration and facet arthrosis.  Other neck: Prominent periapical lucency about the left mandibular third molar.  CTA HEAD  Anterior circulation: Internal carotid arteries are patent from skullbase to carotid termini without stenosis. ACAs and MCAs are patent with mild branch vessel irregularity but no significant proximal stenosis. No intracranial aneurysm is identified.  Posterior circulation: Intracranial vertebral arteries are patent to the basilar bilaterally without stenosis. PICA and SCA origins are patent.  Basilar artery is patent without stenosis. PCAs are patent with mild irregularity involving the left P1 segment and of P2 and more distal branch vessels bilaterally. There is mild narrowing versus artifact of the distal left P1 segment. Posterior communicating arteries are not identified.  Venous sinuses: Patent.  Anatomic variants: None.  Delayed phase: No abnormal enhancement.  IMPRESSION: 1. No evidence of major intracranial arterial occlusion or significant proximal stenosis. Anterior and posterior circulation branch vessel irregularity may reflect small vessel atherosclerosis. 2. No cervical carotid or vertebral artery stenosis. 3. No evidence of acute intracranial  abnormality or mass.   Electronically Signed   By: Sebastian Ache   On: 06/14/2014 13:58   Ct Angio Chest Aorta W/cm &/or Wo/cm  06/14/2014   CLINICAL DATA:  Acute chest pain  EXAM: CT ANGIOGRAPHY CHEST WITH CONTRAST  TECHNIQUE: Multidetector CT imaging of the chest was performed using the standard protocol during bolus administration of intravenous contrast. Multiplanar CT image reconstructions and MIPs were obtained to evaluate the vascular anatomy.  CONTRAST:  OMNIPAQUE IOHEXOL 350 MG/ML SOLN  COMPARISON:  CT chest 10/16/2013  FINDINGS: Negative for pulmonary embolism. Negative for aortic aneurysm or dissection. Heart size is normal. No pericardial effusion.  Negative for mass or adenopathy. Lungs are clear. Negative for pneumonia or effusion.  Negative upper abdomen  Review of the MIP images confirms the above findings.  IMPRESSION: Negative for pulmonary embolism. No aortic dissection. No acute abnormality.   Electronically Signed   By: Marlan Palau M.D.   On: 06/14/2014 13:40     EKG Interpretation   Date/Time:  Wednesday June 14 2014 11:24:52 EDT Ventricular Rate:  64 PR Interval:  148 QRS Duration: 96 QT Interval:  416 QTC Calculation: 429 R Axis:   89 Text Interpretation:  Sinus rhythm RSR' in V1 or V2, right VCD or RVH  Minimal ST elevation, inferior leads Baseline wander in lead(s) V4  Confirmed by Kennedy Bohanon  MD, Crue Otero ([id number]) on 06/14/2014 11:39:28 AM      MDM   Final diagnoses:  Acute chest pain  Left arm weakness   Patient presented code stroke, evaluate by neurology myself on arrival, CT head done no acute findings result reviewed, discussed with neurology.  Patient started having chest pain on the ER, chest pain improved, plan for troponin and observation further. No old EKG nonspecific findings on EKG in the ER. CT scan done due to neurologic and cardiac possibilities, no dissection seen. Neurology evaluated. Discussed with [facility] hospitalist for observation  telemetry.  The patients results and plan were reviewed and discussed.   Any x-rays performed were personally reviewed by myself.   Differential diagnosis were considered with the presenting HPI.  Medications  sodium chloride 0.9 % bolus 500 mL (500 mLs Intravenous Not Given 06/14/14 1305)  sodium chloride 0.9 % bolus 500 mL (0 mLs Intravenous Stopped 06/14/14 1300)  metoCLOPramide (REGLAN) injection 10 mg (10 mg Intravenous Given 06/14/14 1156)  diphenhydrAMINE (BENADRYL) injection 25 mg (25 mg Intravenous Given 06/14/14 1157)  ketorolac (TORADOL) 15 MG/ML injection 15 mg (15 mg Intravenous Given 06/14/14 1156)  iohexol (OMNIPAQUE) 350 MG/ML injection 130 mL (130 mLs Intravenous Contrast Given 06/14/14 1247)  morphine 4 MG/ML injection 4 mg (4 mg Intravenous Given 06/14/14 1504)    Filed Vitals:   06/14/14 1405 06/14/14 1415 06/14/14 1416 06/14/14 1430  BP: 91/77 112/72  111/71  Pulse: 65 60  81  Temp:   97.8 F (36.6 C)   TempSrc:  Resp: Height:      Weight:      SpO2: 100% 100%  100%    Final diagnoses:  Acute chest pain  Left arm weakness    Admission/ observation were discussed with the admitting physician, patient and/or family and they are comfortable with the plan.    Blane Ohara, MD 06/14/14 [phone]

## 2014-06-14 NOTE — ED Notes (Signed)
Admitting at bedside 

## 2014-06-14 NOTE — ED Notes (Signed)
Pt arrives from home via [facility]. Pt states he was having dizziness and h/a last night that cleared before bed. Pt reports he woke up this morning with a h/a 9/10, left sided facial droop, unsteady gait with weakness in legs bilaterally, slurred speech, vision changes and dizziness with left sided arm numbness and weakness.

## 2014-06-14 NOTE — ED Notes (Signed)
Pt complaining of left sided arm pain and left sided chest pressure. EDP notified and given EKG.

## 2014-06-15 ENCOUNTER — Observation Stay (HOSPITAL_COMMUNITY): Payer: Self-pay

## 2014-06-15 DIAGNOSIS — M2550 Pain in unspecified joint: Secondary | ICD-10-CM

## 2014-06-15 DIAGNOSIS — G8929 Other chronic pain: Secondary | ICD-10-CM

## 2014-06-15 DIAGNOSIS — R079 Chest pain, unspecified: Secondary | ICD-10-CM

## 2014-06-15 LAB — CBC
HCT: 41.7 % (ref 39.0–52.0)
HEMOGLOBIN: 13.9 g/dL (ref 13.0–17.0)
MCH: 30 pg (ref 26.0–34.0)
MCHC: 33.3 g/dL (ref 30.0–36.0)
MCV: 89.9 fL (ref 78.0–100.0)
Platelets: 191 10*3/uL (ref 150–400)
RBC: 4.64 MIL/uL (ref 4.22–5.81)
RDW: 13.8 % (ref 11.5–15.5)
WBC: 6.7 10*3/uL (ref 4.0–10.5)

## 2014-06-15 LAB — BASIC METABOLIC PANEL
Anion gap: 7 (ref 5–15)
BUN: 11 mg/dL (ref 6–23)
CO2: 27 mmol/L (ref 19–32)
Calcium: 8.3 mg/dL — ABNORMAL LOW (ref 8.4–10.5)
Chloride: 107 mmol/L (ref 96–112)
Creatinine, Ser: 1.1 mg/dL (ref 0.50–1.35)
GFR calc Af Amer: >90 mL/min (ref >90)
GFR calc non Af Amer: 79 mL/min — ABNORMAL LOW (ref >90)
GLUCOSE: 92 mg/dL (ref 70–99)
Potassium: 4 mmol/L (ref 3.5–5.1)
Sodium: 141 mmol/L (ref 135–145)

## 2014-06-15 LAB — TROPONIN I
Troponin I: <0.03 ng/mL (ref <0.031)
Troponin I: <0.03 ng/mL (ref <0.031)

## 2014-06-15 MED ORDER — OXYCODONE-ACETAMINOPHEN 5-325 MG PO TABS
1.0000 | ORAL_TABLET | Freq: Every morning | ORAL | Status: DC
  Administered 2014-06-15 – 2014-06-16 (×2): 1 via ORAL
  Filled 2014-06-15 (×2): qty 1

## 2014-06-15 MED ORDER — OXYCODONE HCL 5 MG PO TABS
5.0000 mg | ORAL_TABLET | Freq: Every morning | ORAL | Status: DC
  Administered 2014-06-15 – 2014-06-16 (×2): 5 mg via ORAL
  Filled 2014-06-15 (×2): qty 1

## 2014-06-15 MED ORDER — OXYCODONE-ACETAMINOPHEN 10-325 MG PO TABS: 1.0000 | ORAL_TABLET | Freq: Every evening | ORAL | Status: DC | PRN

## 2014-06-15 MED ORDER — NICOTINE 21 MG/24HR TD PT24
21.0000 mg | MEDICATED_PATCH | Freq: Every day | TRANSDERMAL | Status: DC
  Administered 2014-06-15 – 2014-06-16 (×2): 21 mg via TRANSDERMAL
  Filled 2014-06-15 (×2): qty 1

## 2014-06-15 MED ORDER — OXYCODONE-ACETAMINOPHEN 5-325 MG PO TABS
2.0000 | ORAL_TABLET | Freq: Once | ORAL | Status: AC
  Administered 2014-06-15: 2 via ORAL
  Filled 2014-06-15: qty 2

## 2014-06-15 MED ORDER — CYCLOBENZAPRINE HCL 10 MG PO TABS
10.0000 mg | ORAL_TABLET | Freq: Once | ORAL | Status: AC
  Administered 2014-06-15: 10 mg via ORAL
  Filled 2014-06-15: qty 1

## 2014-06-15 MED ORDER — SENNOSIDES-DOCUSATE SODIUM 8.6-50 MG PO TABS: 1.0000 | ORAL_TABLET | Freq: Every evening | ORAL | Status: DC | PRN

## 2014-06-15 MED ORDER — ALPRAZOLAM 0.5 MG PO TABS
2.0000 mg | ORAL_TABLET | Freq: Four times a day (QID) | ORAL | Status: DC
  Administered 2014-06-15 – 2014-06-16 (×7): 2 mg via ORAL
  Filled 2014-06-15 (×7): qty 4

## 2014-06-15 MED ORDER — SENNA 8.6 MG PO TABS
1.0000 | ORAL_TABLET | Freq: Two times a day (BID) | ORAL | Status: DC
Start: 2014-06-15 — End: 2014-06-16
  Administered 2014-06-15 – 2014-06-16 (×2): 8.6 mg via ORAL
  Filled 2014-06-15 (×2): qty 1

## 2014-06-15 MED ORDER — OXYCODONE-ACETAMINOPHEN 10-325 MG PO TABS: 1.0000 | ORAL_TABLET | Freq: Every morning | ORAL | Status: DC

## 2014-06-15 MED ORDER — SODIUM CHLORIDE 0.9 % IV BOLUS (SEPSIS)
1000.0000 mL | Freq: Once | INTRAVENOUS | Status: AC
  Administered 2014-06-15: 1000 mL via INTRAVENOUS

## 2014-06-15 NOTE — Progress Notes (Signed)
[facility] PROGRESS NOTE  [name] [name] AVW:[id number] DOB: 09/04/1965 DOA: 06/14/2014 PCP: Joanna Hews, MD Interim summary: 49 y.o.  Male admitted for left sided weakness and TIA work up ordered.  Assessment/Plan: 1. Left sided weakness: All symptoms are resolve.d  TIA work up ordered.  MRI and CT of the head and neck ordered and negative for significant pathology.  Resume aspirin.   2. Chronic back pain: Resume home meds.    3. Bipolar disorder: Anxious and resumed home dose xanax.    4. Chest pain: Serial enzymes negative. And EKG unremarkable. Repeat EKG today.   Drug abuse: UDS  Positive for benzos, cocaine and marijuana.  Counseling given.   Code Status:full code.  Family Communication: family at bedside Disposition Plan: pending.    Consultants:  neurology  Procedures:  MRI brain.  Antibiotics:  none  HPI/Subjective: Requesting his home meds to be put on.   Objective: Filed Vitals:   06/15/14 1427  BP: 110/61  Pulse: 74  Temp: 98.2 F (36.8 C)  Resp: 16    Intake/Output Summary (Last 24 hours) at 06/15/14 1751 Last data filed at 06/14/14 2000  Gross per 24 hour  Intake    240 ml  Output      0 ml  Net    240 ml   Filed Weights   06/14/14 1124 06/14/14 1839  Weight: 90.776 kg (200 lb 2 oz) 90.776 kg (200 lb 2 oz)    Exam:   General:  Alert afebrile comfortable  Cardiovascular: s1s2  Respiratory: ctab  Abdomen: soft non tender non distended bowel sounds heard  Musculoskeletal: no pedal edema.   Data Reviewed: Basic Metabolic Panel:  Recent Labs Lab 06/14/14 1109 06/14/14 1117 06/15/14 0615  NA 139 140 141  K 3.9 3.8 4.0  CL 106 101 107  CO2 30  --  27  GLUCOSE 110* 107* 92  BUN 10 14 11   CREATININE 1.26 1.10 1.10  CALCIUM 8.8  --  8.3*   Liver Function Tests:  Recent Labs Lab 06/14/14 1109  AST 20  ALT 14  ALKPHOS 102  BILITOT 0.7  PROT 6.4  ALBUMIN 4.0   No results for input(s): LIPASE,  AMYLASE in the last 168 hours. No results for input(s): AMMONIA in the last 168 hours. CBC:  Recent Labs Lab 06/14/14 1109 06/14/14 1117 06/15/14 0615  WBC 9.3  --  6.7  NEUTROABS 6.3  --   --   HGB 14.8 15.3 13.9  HCT 44.1 45.0 41.7  MCV 89.5  --  89.9  PLT 185  --  191   Cardiac Enzymes:  Recent Labs Lab 06/14/14 1349 06/14/14 2108 06/15/14 0010 06/15/14 0615  TROPONINI <0.03 <0.03 <0.03 <0.03   BNP (last 3 results) No results for input(s): BNP in the last 8760 hours.  ProBNP (last 3 results) No results for input(s): PROBNP in the last 8760 hours.  CBG: No results for input(s): GLUCAP in the last 168 hours.  No results found for this or any previous visit (from the past 240 hour(s)).   Studies: Ct Angio Head W/cm &/or Wo Cm  06/14/2014   CLINICAL DATA:  Left-sided arm numbness and weakness, left facial droop, slurred speech, vision changes, and unsteady gait.  EXAM: CT ANGIOGRAPHY HEAD AND NECK  TECHNIQUE: Multidetector CT imaging of the head and neck was performed using the standard protocol during bolus administration of intravenous contrast. Multiplanar CT image reconstructions and MIPs were obtained to evaluate the vascular anatomy.  Carotid stenosis measurements (when applicable) are obtained utilizing NASCET criteria, using the distal internal carotid diameter as the denominator.  CONTRAST:  50 mL Omnipaque 350  COMPARISON:  Noncontrast head CT earlier today  FINDINGS: CT HEAD  Brain: There is no evidence of acute cortical infarct, intracranial hemorrhage, mass, midline shift, or extra-axial fluid collection. Ventricles and sulci are normal.  Calvarium and skull base: No aggressive osseous lesions identified.  Paranasal sinuses: Clear.  Orbits: Unremarkable.  CTA NECK  Aortic arch: 3 vessel aortic arch. Brachiocephalic and subclavian arteries are widely patent, with minimal calcification noted at the left subclavian artery origin.  Right carotid system: Patent without  evidence of stenosis, dissection, or aneurysm.  Left carotid system: Patent without evidence of stenosis, dissection, or aneurysm. Minimal calcified plaque at the carotid bifurcation.  Vertebral arteries: Patent without stenosis. Right vertebral artery is minimally larger than the left.  Skeleton: Mild multilevel cervical disc degeneration and facet arthrosis.  Other neck: Prominent periapical lucency about the left mandibular third molar.  CTA HEAD  Anterior circulation: Internal carotid arteries are patent from skullbase to carotid termini without stenosis. ACAs and MCAs are patent with mild branch vessel irregularity but no significant proximal stenosis. No intracranial aneurysm is identified.  Posterior circulation: Intracranial vertebral arteries are patent to the basilar bilaterally without stenosis. PICA and SCA origins are patent. Basilar artery is patent without stenosis. PCAs are patent with mild irregularity involving the left P1 segment and of P2 and more distal branch vessels bilaterally. There is mild narrowing versus artifact of the distal left P1 segment. Posterior communicating arteries are not identified.  Venous sinuses: Patent.  Anatomic variants: None.  Delayed phase: No abnormal enhancement.  IMPRESSION: 1. No evidence of major intracranial arterial occlusion or significant proximal stenosis. Anterior and posterior circulation branch vessel irregularity may reflect small vessel atherosclerosis. 2. No cervical carotid or vertebral artery stenosis. 3. No evidence of acute intracranial abnormality or mass.   Electronically Signed   By: Sebastian Ache   On: 06/14/2014 13:58   Ct Head Wo Contrast  06/14/2014   CLINICAL DATA:  Code stroke, headache, dizziness, left arm numbness  EXAM: CT HEAD WITHOUT CONTRAST  TECHNIQUE: Contiguous axial images were obtained from the base of the skull through the vertex without intravenous contrast.  COMPARISON:  None.  FINDINGS: No skull fracture is noted.  Paranasal sinuses and mastoid air cells are unremarkable.  No intracranial hemorrhage, mass effect or midline shift. No definite acute cortical infarction. No mass lesion is noted on this unenhanced scan.  IMPRESSION: No acute intracranial abnormality. No definite acute cortical infarction. These results were called by telephone at the time of interpretation on 06/14/2014 at 11:22 am to Dr. Thad Ranger, who verbally acknowledged these results.   Electronically Signed   By: Natasha Mead M.D.   On: 06/14/2014 11:22   Ct Angio Neck W/cm &/or Wo/cm  06/14/2014   CLINICAL DATA:  Left-sided arm numbness and weakness, left facial droop, slurred speech, vision changes, and unsteady gait.  EXAM: CT ANGIOGRAPHY HEAD AND NECK  TECHNIQUE: Multidetector CT imaging of the head and neck was performed using the standard protocol during bolus administration of intravenous contrast. Multiplanar CT image reconstructions and MIPs were obtained to evaluate the vascular anatomy. Carotid stenosis measurements (when applicable) are obtained utilizing NASCET criteria, using the distal internal carotid diameter as the denominator.  CONTRAST:  50 mL Omnipaque 350  COMPARISON:  Noncontrast head CT earlier today  FINDINGS: CT HEAD  Brain:  There is no evidence of acute cortical infarct, intracranial hemorrhage, mass, midline shift, or extra-axial fluid collection. Ventricles and sulci are normal.  Calvarium and skull base: No aggressive osseous lesions identified.  Paranasal sinuses: Clear.  Orbits: Unremarkable.  CTA NECK  Aortic arch: 3 vessel aortic arch. Brachiocephalic and subclavian arteries are widely patent, with minimal calcification noted at the left subclavian artery origin.  Right carotid system: Patent without evidence of stenosis, dissection, or aneurysm.  Left carotid system: Patent without evidence of stenosis, dissection, or aneurysm. Minimal calcified plaque at the carotid bifurcation.  Vertebral arteries: Patent without stenosis.  Right vertebral artery is minimally larger than the left.  Skeleton: Mild multilevel cervical disc degeneration and facet arthrosis.  Other neck: Prominent periapical lucency about the left mandibular third molar.  CTA HEAD  Anterior circulation: Internal carotid arteries are patent from skullbase to carotid termini without stenosis. ACAs and MCAs are patent with mild branch vessel irregularity but no significant proximal stenosis. No intracranial aneurysm is identified.  Posterior circulation: Intracranial vertebral arteries are patent to the basilar bilaterally without stenosis. PICA and SCA origins are patent. Basilar artery is patent without stenosis. PCAs are patent with mild irregularity involving the left P1 segment and of P2 and more distal branch vessels bilaterally. There is mild narrowing versus artifact of the distal left P1 segment. Posterior communicating arteries are not identified.  Venous sinuses: Patent.  Anatomic variants: None.  Delayed phase: No abnormal enhancement.  IMPRESSION: 1. No evidence of major intracranial arterial occlusion or significant proximal stenosis. Anterior and posterior circulation branch vessel irregularity may reflect small vessel atherosclerosis. 2. No cervical carotid or vertebral artery stenosis. 3. No evidence of acute intracranial abnormality or mass.   Electronically Signed   By: Sebastian AcheAllen  Grady   On: 06/14/2014 13:58   Mr Brain Wo Contrast  06/15/2014   CLINICAL DATA:  49 year old male with history of myocardial infarction presenting with headache and slight tingling left hand. Suspect complicated migraine. Subsequent encounter.  EXAM: MRI HEAD WITHOUT CONTRAST  TECHNIQUE: Multiplanar, multiecho pulse sequences of the brain and surrounding structures were obtained without intravenous contrast.  COMPARISON:  CT angiogram 06/14/2014.  No comparison MR.  FINDINGS: No acute infarct.  No intracranial hemorrhage.  No hydrocephalus.  No intracranial mass lesion noted on  this unenhanced exam.  Major intracranial vascular structures are patent.  Cervical medullary junction, pituitary region, pineal region and orbital structures unremarkable.  IMPRESSION: Negative unenhanced brain MR as noted above.   Electronically Signed   By: Lacy DuverneySteven  Olson M.D.   On: 06/15/2014 11:35   Ct Angio Chest Aorta W/cm &/or Wo/cm  06/14/2014   CLINICAL DATA:  Acute chest pain  EXAM: CT ANGIOGRAPHY CHEST WITH CONTRAST  TECHNIQUE: Multidetector CT imaging of the chest was performed using the standard protocol during bolus administration of intravenous contrast. Multiplanar CT image reconstructions and MIPs were obtained to evaluate the vascular anatomy.  CONTRAST:  130mL OMNIPAQUE IOHEXOL 350 MG/ML SOLN  COMPARISON:  CT chest 10/16/2013  FINDINGS: Negative for pulmonary embolism. Negative for aortic aneurysm or dissection. Heart size is normal. No pericardial effusion.  Negative for mass or adenopathy. Lungs are clear. Negative for pneumonia or effusion.  Negative upper abdomen  Review of the MIP images confirms the above findings.  IMPRESSION: Negative for pulmonary embolism. No aortic dissection. No acute abnormality.   Electronically Signed   By: Marlan Palauharles  Clark M.D.   On: 06/14/2014 13:40    Scheduled Meds: . ALPRAZolam  2  mg Oral QID  . aspirin EC  81 mg Oral Daily  . atomoxetine  40 mg Oral Daily  . gabapentin  300 mg Oral TID  . heparin  5,000 Units Subcutaneous 3 times per day  . oxyCODONE-acetaminophen  1 tablet Oral q morning - 10a   And  . oxyCODONE  5 mg Oral q morning - 10a  . sodium chloride  3 mL Intravenous Q12H   Continuous Infusions:   Principal Problem:   Left-sided weakness Active Problems:   Chest pain   Chronic joint pain   Left arm weakness    Time spent: 25 minutes    Katharin Schneider  [facility] Pager [phone] If 7PM-7AM, please contact night-coverage at [email or web address], password [facility] 06/15/2014, 5:51 PM

## 2014-06-15 NOTE — Progress Notes (Signed)
UR completed 

## 2014-06-15 NOTE — Progress Notes (Signed)
Subjective: Patient feels he has only slight tingling in his left hand. He feels he has a mild HA but states it has improved significantly (rates headache at a 2-3/10). His main complaint is back pain.   Objective: Current vital signs: BP 99/57 mmHg  Pulse 92  Temp(Src) 97.8 F (36.6 C) (Oral)  Resp 16  Ht 6" (0.152 m)  Wt 90.776 kg (200 lb 2 oz)  BMI 3929.02 kg/m2  SpO2 99% Vital signs in last 24 hours: Temp:  [97.7 F (36.5 C)-99 F (37.2 C)] 97.8 F (36.6 C) (03/31 0559) Pulse Rate:  [55-92] 92 (03/31 0559) Resp:  [11-25] 16 (03/31 0559) BP: (87-122)/(55-80) 99/57 mmHg (03/31 0559) SpO2:  [96 %-100 %] 99 % (03/31 0559) Weight:  [90.776 kg (200 lb 2 oz)] 90.776 kg (200 lb 2 oz) (03/30 1839)  Intake/Output from previous day: 03/30 0701 - 03/31 0700 In: 240 [P.O.:240] Out: -  Intake/Output this shift:   Nutritional status: Diet Heart Room service appropriate?: Yes; Fluid consistency:: Thin  Neurologic Exam: Mental Status: Alert, oriented, thought content appropriate. Speech fluent without evidence of aphasia. Able to follow 3 step commands without difficulty. Cranial Nerves: II:  Visual fields grossly normal, pupils equal, round, reactive to light and accommodation III,IV, VI: ptosis not present, extra-ocular motions intact bilaterally V,VII: smile symmetric, facial light touch sensation intact VIII: hearing normal bilaterally IX,X: gag reflex present XI: bilateral shoulder shrug XII: midline tongue extension Motor: Right :Upper extremity 5/5Left: Upper extremity 5/5 Give-way weakness in the BLE's Tone and bulk:normal tone throughout; no atrophy noted Sensory: Pinprick and light touch decreased on the left hand Deep Tendon Reflexes: 2+ and symmetric throughout Plantars: Right: downgoingLeft: downgoing Cerebellar: normal finger-to-nose and normal heel-to-shin testing bilaterally  Lab  Results: Basic Metabolic Panel:  Recent Labs Lab 06/14/14 1109 06/14/14 1117 06/15/14 0615  NA 139 140 141  K 3.9 3.8 4.0  CL 106 101 107  CO2 30  --  27  GLUCOSE 110* 107* 92  BUN CREATININE 1.26 1.10 1.10  CALCIUM 8.8  --  8.3*    Liver Function Tests:  Recent Labs Lab 06/14/14 1109  AST 20  ALT 14  ALKPHOS 102  BILITOT 0.7  PROT 6.4  ALBUMIN 4.0   No results for input(s): LIPASE, AMYLASE in the last 168 hours. No results for input(s): AMMONIA in the last 168 hours.  CBC:  Recent Labs Lab 06/14/14 1109 06/14/14 1117 06/15/14 0615  WBC 9.3  --  6.7  NEUTROABS 6.3  --   --   HGB 14.8 15.3 13.9  HCT 44.1 45.0 41.7  MCV 89.5  --  89.9  PLT 185  --  191    Cardiac Enzymes:  Recent Labs Lab 06/14/14 1349 06/14/14 2108 06/15/14 0010 06/15/14 0615  TROPONINI <0.03 <0.03 <0.03 <0.03    Lipid Panel: No results for input(s): CHOL, TRIG, HDL, CHOLHDL, VLDL, LDLCALC in the last 168 hours.  CBG: No results for input(s): GLUCAP in the last 168 hours.  Microbiology: No results found for this or any previous visit.  Coagulation Studies:  Recent Labs  06/14/14 1109 06/14/14 2108  LABPROT 12.6 12.3  INR 0.93 0.90    Imaging: Ct Angio Head W/cm &/or Wo Cm  06/14/2014   CLINICAL DATA:  Left-sided arm numbness and weakness, left facial droop, slurred speech, vision changes, and unsteady gait.  EXAM: CT ANGIOGRAPHY HEAD AND NECK  TECHNIQUE: Multidetector CT imaging of the head and neck was  performed using the standard protocol during bolus administration of intravenous contrast. Multiplanar CT image reconstructions and MIPs were obtained to evaluate the vascular anatomy. Carotid stenosis measurements (when applicable) are obtained utilizing NASCET criteria, using the distal internal carotid diameter as the denominator.  CONTRAST:  50 mL Omnipaque 350  COMPARISON:  Noncontrast head CT earlier today  FINDINGS: CT HEAD  Brain: There is no evidence of  acute cortical infarct, intracranial hemorrhage, mass, midline shift, or extra-axial fluid collection. Ventricles and sulci are normal.  Calvarium and skull base: No aggressive osseous lesions identified.  Paranasal sinuses: Clear.  Orbits: Unremarkable.  CTA NECK  Aortic arch: 3 vessel aortic arch. Brachiocephalic and subclavian arteries are widely patent, with minimal calcification noted at the left subclavian artery origin.  Right carotid system: Patent without evidence of stenosis, dissection, or aneurysm.  Left carotid system: Patent without evidence of stenosis, dissection, or aneurysm. Minimal calcified plaque at the carotid bifurcation.  Vertebral arteries: Patent without stenosis. Right vertebral artery is minimally larger than the left.  Skeleton: Mild multilevel cervical disc degeneration and facet arthrosis.  Other neck: Prominent periapical lucency about the left mandibular third molar.  CTA HEAD  Anterior circulation: Internal carotid arteries are patent from skullbase to carotid termini without stenosis. ACAs and MCAs are patent with mild branch vessel irregularity but no significant proximal stenosis. No intracranial aneurysm is identified.  Posterior circulation: Intracranial vertebral arteries are patent to the basilar bilaterally without stenosis. PICA and SCA origins are patent. Basilar artery is patent without stenosis. PCAs are patent with mild irregularity involving the left P1 segment and of P2 and more distal branch vessels bilaterally. There is mild narrowing versus artifact of the distal left P1 segment. Posterior communicating arteries are not identified.  Venous sinuses: Patent.  Anatomic variants: None.  Delayed phase: No abnormal enhancement.  IMPRESSION: 1. No evidence of major intracranial arterial occlusion or significant proximal stenosis. Anterior and posterior circulation branch vessel irregularity may reflect small vessel atherosclerosis. 2. No cervical carotid or vertebral  artery stenosis. 3. No evidence of acute intracranial abnormality or mass.   Electronically Signed   By: Sebastian Ache   On: 06/14/2014 13:58   Ct Head Wo Contrast  06/14/2014   CLINICAL DATA:  Code stroke, headache, dizziness, left arm numbness  EXAM: CT HEAD WITHOUT CONTRAST  TECHNIQUE: Contiguous axial images were obtained from the base of the skull through the vertex without intravenous contrast.  COMPARISON:  None.  FINDINGS: No skull fracture is noted. Paranasal sinuses and mastoid air cells are unremarkable.  No intracranial hemorrhage, mass effect or midline shift. No definite acute cortical infarction. No mass lesion is noted on this unenhanced scan.  IMPRESSION: No acute intracranial abnormality. No definite acute cortical infarction. These results were called by telephone at the time of interpretation on 06/14/2014 at 11:22 am to Dr. Thad Ranger, who verbally acknowledged these results.   Electronically Signed   By: Natasha Mead M.D.   On: 06/14/2014 11:22   Ct Angio Neck W/cm &/or Wo/cm  06/14/2014   CLINICAL DATA:  Left-sided arm numbness and weakness, left facial droop, slurred speech, vision changes, and unsteady gait.  EXAM: CT ANGIOGRAPHY HEAD AND NECK  TECHNIQUE: Multidetector CT imaging of the head and neck was performed using the standard protocol during bolus administration of intravenous contrast. Multiplanar CT image reconstructions and MIPs were obtained to evaluate the vascular anatomy. Carotid stenosis measurements (when applicable) are obtained utilizing NASCET criteria, using the distal internal carotid diameter  as the denominator.  CONTRAST:  50 mL Omnipaque 350  COMPARISON:  Noncontrast head CT earlier today  FINDINGS: CT HEAD  Brain: There is no evidence of acute cortical infarct, intracranial hemorrhage, mass, midline shift, or extra-axial fluid collection. Ventricles and sulci are normal.  Calvarium and skull base: No aggressive osseous lesions identified.  Paranasal sinuses: Clear.   Orbits: Unremarkable.  CTA NECK  Aortic arch: 3 vessel aortic arch. Brachiocephalic and subclavian arteries are widely patent, with minimal calcification noted at the left subclavian artery origin.  Right carotid system: Patent without evidence of stenosis, dissection, or aneurysm.  Left carotid system: Patent without evidence of stenosis, dissection, or aneurysm. Minimal calcified plaque at the carotid bifurcation.  Vertebral arteries: Patent without stenosis. Right vertebral artery is minimally larger than the left.  Skeleton: Mild multilevel cervical disc degeneration and facet arthrosis.  Other neck: Prominent periapical lucency about the left mandibular third molar.  CTA HEAD  Anterior circulation: Internal carotid arteries are patent from skullbase to carotid termini without stenosis. ACAs and MCAs are patent with mild branch vessel irregularity but no significant proximal stenosis. No intracranial aneurysm is identified.  Posterior circulation: Intracranial vertebral arteries are patent to the basilar bilaterally without stenosis. PICA and SCA origins are patent. Basilar artery is patent without stenosis. PCAs are patent with mild irregularity involving the left P1 segment and of P2 and more distal branch vessels bilaterally. There is mild narrowing versus artifact of the distal left P1 segment. Posterior communicating arteries are not identified.  Venous sinuses: Patent.  Anatomic variants: None.  Delayed phase: No abnormal enhancement.  IMPRESSION: 1. No evidence of major intracranial arterial occlusion or significant proximal stenosis. Anterior and posterior circulation branch vessel irregularity may reflect small vessel atherosclerosis. 2. No cervical carotid or vertebral artery stenosis. 3. No evidence of acute intracranial abnormality or mass.   Electronically Signed   By: Sebastian AcheAllen  Grady   On: 06/14/2014 13:58   Ct Angio Chest Aorta W/cm &/or Wo/cm  06/14/2014   CLINICAL DATA:  Acute chest pain  EXAM:  CT ANGIOGRAPHY CHEST WITH CONTRAST  TECHNIQUE: Multidetector CT imaging of the chest was performed using the standard protocol during bolus administration of intravenous contrast. Multiplanar CT image reconstructions and MIPs were obtained to evaluate the vascular anatomy.  CONTRAST:  130mL OMNIPAQUE IOHEXOL 350 MG/ML SOLN  COMPARISON:  CT chest 10/16/2013  FINDINGS: Negative for pulmonary embolism. Negative for aortic aneurysm or dissection. Heart size is normal. No pericardial effusion.  Negative for mass or adenopathy. Lungs are clear. Negative for pneumonia or effusion.  Negative upper abdomen  Review of the MIP images confirms the above findings.  IMPRESSION: Negative for pulmonary embolism. No aortic dissection. No acute abnormality.   Electronically Signed   By: Marlan Palauharles  Clark M.D.   On: 06/14/2014 13:40    Medications:  Scheduled: . ALPRAZolam  2 mg Oral QID  . aspirin EC  81 mg Oral Daily  . atomoxetine  40 mg Oral Daily  . gabapentin  300 mg Oral TID  . heparin  5,000 Units Subcutaneous 3 times per day  . oxyCODONE-acetaminophen  1 tablet Oral q morning - 10a   And  . oxyCODONE  5 mg Oral q morning - 10a  . sodium chloride  3 mL Intravenous Q12H    Felicie MornDavid Smith PA-C [facility] [phone]  06/15/2014, 10:12 AM   Patient seen and examined.  Clinical course and management discussed.  Necessary edits performed.  I agree with the  above.  Assessment and plan of care developed and discussed below.    Assessment/Plan: HA has improved today with only slight tingling in his left hand. Speech remains baseline. MRI brain pending. Continue to suspect complicated migraine.    Recommendations: 1.  MRI pending.  Patient seen and examined.  Clinical course and management discussed.  Necessary edits performed.  I agree with the above.  Assessment and plan of care developed and discussed below.    Thana Farr, MD [facility] [phone]  06/15/2014  10:44  AM

## 2014-06-16 DIAGNOSIS — R079 Chest pain, unspecified: Secondary | ICD-10-CM

## 2014-06-16 DIAGNOSIS — G43809 Other migraine, not intractable, without status migrainosus: Secondary | ICD-10-CM

## 2014-06-16 LAB — CBC
HEMATOCRIT: 39.9 % (ref 39.0–52.0)
HEMOGLOBIN: 13.1 g/dL (ref 13.0–17.0)
MCH: 29.6 pg (ref 26.0–34.0)
MCHC: 32.8 g/dL (ref 30.0–36.0)
MCV: 90.1 fL (ref 78.0–100.0)
Platelets: 182 10*3/uL (ref 150–400)
RBC: 4.43 MIL/uL (ref 4.22–5.81)
RDW: 13.6 % (ref 11.5–15.5)
WBC: 5.6 10*3/uL (ref 4.0–10.5)

## 2014-06-16 LAB — BASIC METABOLIC PANEL
Anion gap: 4 — ABNORMAL LOW (ref 5–15)
BUN: 9 mg/dL (ref 6–23)
CALCIUM: 8.2 mg/dL — AB (ref 8.4–10.5)
CHLORIDE: 106 mmol/L (ref 96–112)
CO2: 30 mmol/L (ref 19–32)
Creatinine, Ser: 1.09 mg/dL (ref 0.50–1.35)
GFR calc Af Amer: >90 mL/min (ref >90)
GFR calc non Af Amer: 80 mL/min — ABNORMAL LOW (ref >90)
Glucose, Bld: 95 mg/dL (ref 70–99)
POTASSIUM: 4.1 mmol/L (ref 3.5–5.1)
Sodium: 140 mmol/L (ref 135–145)

## 2014-06-16 LAB — HEMOGLOBIN A1C
Hgb A1c MFr Bld: 5.4 % (ref 4.8–5.6)
MEAN PLASMA GLUCOSE: 108 mg/dL

## 2014-06-16 MED ORDER — OXYCODONE HCL 5 MG PO TABS
5.0000 mg | ORAL_TABLET | Freq: Four times a day (QID) | ORAL | Status: DC | PRN
  Administered 2014-06-16 (×2): 5 mg via ORAL
  Filled 2014-06-16 (×2): qty 1

## 2014-06-16 MED ORDER — GABAPENTIN 300 MG PO CAPS: 300.0000 mg | ORAL_CAPSULE | Freq: Two times a day (BID) | ORAL | Status: DC

## 2014-06-16 MED ORDER — PREDNISONE 20 MG PO TABS
40.0000 mg | ORAL_TABLET | Freq: Every day | ORAL | Status: DC
  Administered 2014-06-16: 40 mg via ORAL
  Filled 2014-06-16: qty 2

## 2014-06-16 MED ORDER — OXYCODONE-ACETAMINOPHEN 10-325 MG PO TABS: 3.0000 | ORAL_TABLET | Freq: Every evening | ORAL | Status: DC | PRN

## 2014-06-16 NOTE — Progress Notes (Signed)
Patient complained of sweating and "not feeling good." VSS, see vitals flowsheet. MD aware. Will continue D/C.

## 2014-06-16 NOTE — Progress Notes (Signed)
Subjective: States he has a HA rated a 4-5/10.  His left hand still has mild paresthesia and leg.  No significant change overnight. Still complains of back pain.  Objective: Current vital signs: BP 100/59 mmHg  Pulse 80  Temp(Src) 97.8 F (36.6 C) (Oral)  Resp 20  Ht 6" (0.152 m)  Wt 90.776 kg (200 lb 2 oz)  BMI 3929.02 kg/m2  SpO2 99% Vital signs in last 24 hours: Temp:  [97.8 F (36.6 C)-98.7 F (37.1 C)] 97.8 F (36.6 C) (04/01 0550) Pulse Rate:  [74-88] 80 (04/01 0550) Resp:  [16-20] 20 (04/01 0550) BP: (77-115)/(59-74) 100/59 mmHg (04/01 0550) SpO2:  [95 %-100 %] 99 % (04/01 0550)  Intake/Output from previous day: 03/31 0701 - 04/01 0700 In: 360 [P.O.:360] Out: -  Intake/Output this shift:   Nutritional status: Diet Heart Room service appropriate?: Yes; Fluid consistency:: Thin  Neurologic Exam:  Mental Status: Alert, oriented, thought content appropriate.  Speech fluent without evidence of aphasia.  Able to follow 3 step commands without difficulty. Cranial Nerves: II:  Visual fields grossly normal, pupils equal, round, reactive to light and accommodation III,IV, VI: ptosis not present, extra-ocular motions intact bilaterally V,VII: smile symmetric, facial light touch sensation normal bilaterally VIII: hearing normal bilaterally IX,X: uvula rises symmetrically XI: bilateral shoulder shrug XII: midline tongue extension  Motor: Right : Upper extremity   5/5    Left:     Upper extremity   5/5  Lower extremity   5/5     Lower extremity   5/5 Tone and bulk:normal tone throughout; no atrophy noted Sensory: Pinprick and light touch intact throughout with mild decreased sensation in left hand.  Deep Tendon Reflexes:  2+ throughout Plantars: Right: downgoing   Left: downgoing   Lab Results: Basic Metabolic Panel:  Recent Labs Lab 06/14/14 1109 06/14/14 1117 06/15/14 0615 06/16/14 0606  NA 139 140 141 140  K 3.9 3.8 4.0 4.1  CL 106 101 107 106  CO2 30   --  27 30  GLUCOSE 110* 107* 92 95  BUN 10 14 11 9   CREATININE 1.26 1.10 1.10 1.09  CALCIUM 8.8  --  8.3* 8.2*    Liver Function Tests:  Recent Labs Lab 06/14/14 1109  AST 20  ALT 14  ALKPHOS 102  BILITOT 0.7  PROT 6.4  ALBUMIN 4.0   No results for input(s): LIPASE, AMYLASE in the last 168 hours. No results for input(s): AMMONIA in the last 168 hours.  CBC:  Recent Labs Lab 06/14/14 1109 06/14/14 1117 06/15/14 0615 06/16/14 0606  WBC 9.3  --  6.7 5.6  NEUTROABS 6.3  --   --   --   HGB 14.8 15.3 13.9 13.1  HCT 44.1 45.0 41.7 39.9  MCV 89.5  --  89.9 90.1  PLT 185  --  191 182    Cardiac Enzymes:  Recent Labs Lab 06/14/14 1349 06/14/14 2108 06/15/14 0010 06/15/14 0615  TROPONINI <0.03 <0.03 <0.03 <0.03    Lipid Panel: No results for input(s): CHOL, TRIG, HDL, CHOLHDL, VLDL, LDLCALC in the last 168 hours.  CBG: No results for input(s): GLUCAP in the last 168 hours.  Microbiology: No results found for this or any previous visit.  Coagulation Studies:  Recent Labs  06/14/14 1109 06/14/14 2108  LABPROT 12.6 12.3  INR 0.93 0.90    Imaging: Ct Angio Head W/cm &/or Wo Cm  06/14/2014   CLINICAL DATA:  Left-sided arm numbness and weakness, left facial droop, slurred  speech, vision changes, and unsteady gait.  EXAM: CT ANGIOGRAPHY HEAD AND NECK  TECHNIQUE: Multidetector CT imaging of the head and neck was performed using the standard protocol during bolus administration of intravenous contrast. Multiplanar CT image reconstructions and MIPs were obtained to evaluate the vascular anatomy. Carotid stenosis measurements (when applicable) are obtained utilizing NASCET criteria, using the distal internal carotid diameter as the denominator.  CONTRAST:  50 mL Omnipaque 350  COMPARISON:  Noncontrast head CT earlier today  FINDINGS: CT HEAD  Brain: There is no evidence of acute cortical infarct, intracranial hemorrhage, mass, midline shift, or extra-axial fluid  collection. Ventricles and sulci are normal.  Calvarium and skull base: No aggressive osseous lesions identified.  Paranasal sinuses: Clear.  Orbits: Unremarkable.  CTA NECK  Aortic arch: 3 vessel aortic arch. Brachiocephalic and subclavian arteries are widely patent, with minimal calcification noted at the left subclavian artery origin.  Right carotid system: Patent without evidence of stenosis, dissection, or aneurysm.  Left carotid system: Patent without evidence of stenosis, dissection, or aneurysm. Minimal calcified plaque at the carotid bifurcation.  Vertebral arteries: Patent without stenosis. Right vertebral artery is minimally larger than the left.  Skeleton: Mild multilevel cervical disc degeneration and facet arthrosis.  Other neck: Prominent periapical lucency about the left mandibular third molar.  CTA HEAD  Anterior circulation: Internal carotid arteries are patent from skullbase to carotid termini without stenosis. ACAs and MCAs are patent with mild branch vessel irregularity but no significant proximal stenosis. No intracranial aneurysm is identified.  Posterior circulation: Intracranial vertebral arteries are patent to the basilar bilaterally without stenosis. PICA and SCA origins are patent. Basilar artery is patent without stenosis. PCAs are patent with mild irregularity involving the left P1 segment and of P2 and more distal branch vessels bilaterally. There is mild narrowing versus artifact of the distal left P1 segment. Posterior communicating arteries are not identified.  Venous sinuses: Patent.  Anatomic variants: None.  Delayed phase: No abnormal enhancement.  IMPRESSION: 1. No evidence of major intracranial arterial occlusion or significant proximal stenosis. Anterior and posterior circulation branch vessel irregularity may reflect small vessel atherosclerosis. 2. No cervical carotid or vertebral artery stenosis. 3. No evidence of acute intracranial abnormality or mass.   Electronically  Signed   By: Sebastian AcheAllen  Grady   On: 06/14/2014 13:58   Ct Head Wo Contrast  06/14/2014   CLINICAL DATA:  Code stroke, headache, dizziness, left arm numbness  EXAM: CT HEAD WITHOUT CONTRAST  TECHNIQUE: Contiguous axial images were obtained from the base of the skull through the vertex without intravenous contrast.  COMPARISON:  None.  FINDINGS: No skull fracture is noted. Paranasal sinuses and mastoid air cells are unremarkable.  No intracranial hemorrhage, mass effect or midline shift. No definite acute cortical infarction. No mass lesion is noted on this unenhanced scan.  IMPRESSION: No acute intracranial abnormality. No definite acute cortical infarction. These results were called by telephone at the time of interpretation on 06/14/2014 at 11:22 am to Dr. Thad Rangereynolds, who verbally acknowledged these results.   Electronically Signed   By: Natasha MeadLiviu  Pop M.D.   On: 06/14/2014 11:22   Ct Angio Neck W/cm &/or Wo/cm  06/14/2014   CLINICAL DATA:  Left-sided arm numbness and weakness, left facial droop, slurred speech, vision changes, and unsteady gait.  EXAM: CT ANGIOGRAPHY HEAD AND NECK  TECHNIQUE: Multidetector CT imaging of the head and neck was performed using the standard protocol during bolus administration of intravenous contrast. Multiplanar CT image reconstructions and  MIPs were obtained to evaluate the vascular anatomy. Carotid stenosis measurements (when applicable) are obtained utilizing NASCET criteria, using the distal internal carotid diameter as the denominator.  CONTRAST:  50 mL Omnipaque 350  COMPARISON:  Noncontrast head CT earlier today  FINDINGS: CT HEAD  Brain: There is no evidence of acute cortical infarct, intracranial hemorrhage, mass, midline shift, or extra-axial fluid collection. Ventricles and sulci are normal.  Calvarium and skull base: No aggressive osseous lesions identified.  Paranasal sinuses: Clear.  Orbits: Unremarkable.  CTA NECK  Aortic arch: 3 vessel aortic arch. Brachiocephalic and  subclavian arteries are widely patent, with minimal calcification noted at the left subclavian artery origin.  Right carotid system: Patent without evidence of stenosis, dissection, or aneurysm.  Left carotid system: Patent without evidence of stenosis, dissection, or aneurysm. Minimal calcified plaque at the carotid bifurcation.  Vertebral arteries: Patent without stenosis. Right vertebral artery is minimally larger than the left.  Skeleton: Mild multilevel cervical disc degeneration and facet arthrosis.  Other neck: Prominent periapical lucency about the left mandibular third molar.  CTA HEAD  Anterior circulation: Internal carotid arteries are patent from skullbase to carotid termini without stenosis. ACAs and MCAs are patent with mild branch vessel irregularity but no significant proximal stenosis. No intracranial aneurysm is identified.  Posterior circulation: Intracranial vertebral arteries are patent to the basilar bilaterally without stenosis. PICA and SCA origins are patent. Basilar artery is patent without stenosis. PCAs are patent with mild irregularity involving the left P1 segment and of P2 and more distal branch vessels bilaterally. There is mild narrowing versus artifact of the distal left P1 segment. Posterior communicating arteries are not identified.  Venous sinuses: Patent.  Anatomic variants: None.  Delayed phase: No abnormal enhancement.  IMPRESSION: 1. No evidence of major intracranial arterial occlusion or significant proximal stenosis. Anterior and posterior circulation branch vessel irregularity may reflect small vessel atherosclerosis. 2. No cervical carotid or vertebral artery stenosis. 3. No evidence of acute intracranial abnormality or mass.   Electronically Signed   By: Sebastian Ache   On: 06/14/2014 13:58   Mr Brain Wo Contrast  06/15/2014   CLINICAL DATA:  49 year old male with history of myocardial infarction presenting with headache and slight tingling left hand. Suspect  complicated migraine. Subsequent encounter.  EXAM: MRI HEAD WITHOUT CONTRAST  TECHNIQUE: Multiplanar, multiecho pulse sequences of the brain and surrounding structures were obtained without intravenous contrast.  COMPARISON:  CT angiogram 06/14/2014.  No comparison MR.  FINDINGS: No acute infarct.  No intracranial hemorrhage.  No hydrocephalus.  No intracranial mass lesion noted on this unenhanced exam.  Major intracranial vascular structures are patent.  Cervical medullary junction, pituitary region, pineal region and orbital structures unremarkable.  IMPRESSION: Negative unenhanced brain MR as noted above.   Electronically Signed   By: Lacy Duverney M.D.   On: 06/15/2014 11:35   Ct Angio Chest Aorta W/cm &/or Wo/cm  06/14/2014   CLINICAL DATA:  Acute chest pain  EXAM: CT ANGIOGRAPHY CHEST WITH CONTRAST  TECHNIQUE: Multidetector CT imaging of the chest was performed using the standard protocol during bolus administration of intravenous contrast. Multiplanar CT image reconstructions and MIPs were obtained to evaluate the vascular anatomy.  CONTRAST:  OMNIPAQUE IOHEXOL 350 MG/ML SOLN  COMPARISON:  CT chest 10/16/2013  FINDINGS: Negative for pulmonary embolism. Negative for aortic aneurysm or dissection. Heart size is normal. No pericardial effusion.  Negative for mass or adenopathy. Lungs are clear. Negative for pneumonia or effusion.  Negative upper  abdomen  Review of the MIP images confirms the above findings.  IMPRESSION: Negative for pulmonary embolism. No aortic dissection. No acute abnormality.   Electronically Signed   By: Marlan Palau M.D.   On: 06/14/2014 13:40    Medications:  Scheduled: . ALPRAZolam  2 mg Oral QID  . aspirin EC  81 mg Oral Daily  . atomoxetine  40 mg Oral Daily  . gabapentin  300 mg Oral TID  . heparin  5,000 Units Subcutaneous 3 times per day  . nicotine  21 mg Transdermal Daily  . oxyCODONE-acetaminophen  1 tablet Oral q morning - 10a   And  . oxyCODONE  5 mg Oral  q morning - 10a  . senna  1 tablet Oral BID  . sodium chloride  3 mL Intravenous Q12H   Felicie Morn PA-C [facility] [phone]  06/16/2014, 9:38 AM  Patient seen and examined.  Clinical course and management discussed.  Necessary edits performed.  I agree with the above.  Assessment and plan of care developed and discussed below.    Assessment/Plan: HA still present rated at 4-5. Currently continues to have slight tingling in his left hand. Speech remains baseline. MRI brain personally reviewed and shows no acute infarct. Further stroke w/u not recommended.  Recommendations: 1. Consider prednisone taper that may be continued on an outpatient basis for abortion of headache and prevention of rebound. 2.  Follow up with neurology on an outpatient basis for complicated migraine.  May determine need for cervical imaging and/or NCV/EMG at that time.      Thana Farr, MD [facility] [phone]  06/16/2014  11:08 AM

## 2014-06-16 NOTE — Progress Notes (Signed)
Patient complained of anxiety, stated he needed his alprazolam, and stated he could not wait for 10AM med pass. Per MD, 10AM alprazolam 2mg  given to patient now.

## 2014-06-16 NOTE — Progress Notes (Signed)
Patient states he feels like he has a new kidney infection, increased left arm weakness and increased pain in left upper extremity. MD aware. Neurology paged.

## 2014-06-16 NOTE — Progress Notes (Signed)
[facility] with CM needs, OP PT and rolling walker. Reviewed the record, patient is Medicaid Pending.  Spoke with patient and confirmed no insurance. Discussed the recommendations, verified demographic information  Explained that CM will fax referral to [facility] and someone will contact him to schedule an appt.patient verbalized understanding teach back done. He also reported having a rolling walker at home. [facility]. No further CM needs identified.

## 2014-06-16 NOTE — Progress Notes (Signed)
Patient complained of left arm tingling and numbness while down in echo procedure. MD aware.

## 2014-06-16 NOTE — Progress Notes (Signed)
CARE MANAGEMENT NOTE 06/16/2014  Patient:  [name] [name], [name] [name]   Account Number:  [id number]  Date Initiated:  06/16/2014  Documentation initiated by:  Carles Collet  Subjective/Objective Assessment:   pt lives at home with fiance. -cva,     Action/Plan:   will follow for discharge needs   Anticipated DC Date:  06/16/2014   Anticipated DC Plan:  HOME/SELF CARE  In-house referral  [facility]      Choice offered to / List presented to:             Status of service:  Completed, signed off Medicare Important Message given?   (If response is "NO", the following Medicare IM given date fields will be blank) Date Medicare IM given:   Medicare IM given by:   Date Additional Medicare IM given:   Additional Medicare IM given by:    Discharge Disposition:    Per UR Regulation:  Reviewed for med. necessity/level of care/duration of stay  If discussed at [place] of Stay Meetings, dates discussed:    Comments:  06/16/14 met with patient, provided [facility] about [facility]. pt understands to go monday morning to establish himself as new patient and this is necessary to utilize pharmacy there. verified in chart that pt has been seen by financial counselor during this visit. Media planner CM.

## 2014-06-16 NOTE — Evaluation (Signed)
Occupational Therapy Evaluation and Discharge Patient Details Name: Isac CaddyRobert K Dunbar MRN: [id number] DOB: 06/22/1965 Today's Date: 06/16/2014    History of Present Illness Isac CaddyRobert K Jalbert is a 49 y.o. male with past medical history of chronic back pain and reportedly previous MI. Patient came into the hospital complaining about left-sided weakness and slurred speech with accompanied nausea and dizziness, patient also mentions some chest pain radiates to his left arm and pt reports to this therapist that he also had a massive headache at time of admission. Pt urine + for  benzos, cocaine and THC   Clinical Impression   This 49 yo male admitted with above presents to acute OT at a Mod I level, no further OT needs we will sign off.     Follow Up Recommendations  No OT follow up          Precautions / Restrictions Precautions Precautions: None Restrictions Weight Bearing Restrictions: No      Mobility Bed Mobility Overal bed mobility: Independent                Transfers Overall transfer level: Modified independent               General transfer comment: slower than normal due to slight limp--per pt left leg just feels weak         ADL Overall ADL's : Modified independent                                       General ADL Comments: increased time due to LLE weakness     Vision Additional Comments: no change          Pertinent Vitals/Pain Pain Assessment: 0-10 Pain Score: 8  Pain Location: head and back Pain Descriptors / Indicators: Aching Pain Intervention(s): Monitored during session     Hand Dominance Right   Extremity/Trunk Assessment Upper Extremity Assessment Upper Extremity Assessment:  (pt moves arms for testing quite stiffly, but with just spontanous use normal tone)           Communication Communication Communication: No difficulties   Cognition Arousal/Alertness: Awake/alert Behavior During Therapy: WFL for tasks  assessed/performed Overall Cognitive Status: Within Functional Limits for tasks assessed                                Home Living Family/patient expects to be discharged to:: Private residence Living Arrangements: Spouse/significant other Available Help at Discharge: Family Type of Home: House Home Access: Stairs to enter Secretary/administratorntrance Stairs-Number of Steps: 5 Entrance Stairs-Rails: None       Bathroom Shower/Tub: Tub/shower unit;Curtain Shower/tub characteristics: Engineer, building servicesCurtain Bathroom Toilet: Standard     Home Equipment: None          Prior Functioning/Environment Level of Independence: Independent        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

## 2014-06-16 NOTE — Discharge Instructions (Signed)
Migraine Headache A migraine headache is an intense, throbbing pain on one or both sides of your head. A migraine can last for 30 minutes to several hours. CAUSES  The exact cause of a migraine headache is not always known. However, a migraine may be caused when nerves in the brain become irritated and release chemicals that cause inflammation. This causes pain. Certain things may also trigger migraines, such as:  Alcohol.  Smoking.  Stress.  Menstruation.  Aged cheeses.  Foods or drinks that contain nitrates, glutamate, aspartame, or tyramine.  Lack of sleep.  Chocolate.  Caffeine.  Hunger.  Physical exertion.  Fatigue.  Medicines used to treat chest pain (nitroglycerine), birth control pills, estrogen, and some blood pressure medicines. SIGNS AND SYMPTOMS  Pain on one or both sides of your head.  Pulsating or throbbing pain.  Severe pain that prevents daily activities.  Pain that is aggravated by any physical activity.  Nausea, vomiting, or both.  Dizziness.  Pain with exposure to bright lights, loud noises, or activity.  General sensitivity to bright lights, loud noises, or smells. Before you get a migraine, you may get warning signs that a migraine is coming (aura). An aura may include:  Seeing flashing lights.  Seeing bright spots, halos, or zigzag lines.  Having tunnel vision or blurred vision.  Having feelings of numbness or tingling.  Having trouble talking.  Having muscle weakness. DIAGNOSIS  A migraine headache is often diagnosed based on:  Symptoms.  Physical exam.  A CT scan or MRI of your head. These imaging tests cannot diagnose migraines, but they can help rule out other causes of headaches. TREATMENT Medicines may be given for pain and nausea. Medicines can also be given to help prevent recurrent migraines.  HOME CARE INSTRUCTIONS  Only take over-the-counter or prescription medicines for pain or discomfort as directed by your  health care provider. The use of long-term narcotics is not recommended.  Lie down in a dark, quiet room when you have a migraine.  Keep a journal to find out what may trigger your migraine headaches. For example, write down:  What you eat and drink.  How much sleep you get.  Any change to your diet or medicines.  Limit alcohol consumption.  Quit smoking if you smoke.  Get 7-9 hours of sleep, or as recommended by your health care provider.  Limit stress.  Keep lights dim if bright lights bother you and make your migraines worse. SEEK IMMEDIATE MEDICAL CARE IF:   Your migraine becomes severe.  You have a fever.  You have a stiff neck.  You have vision loss.  You have muscular weakness or loss of muscle control.  You start losing your balance or have trouble walking.  You feel faint or pass out.  You have severe symptoms that are different from your first symptoms. MAKE SURE YOU:   Understand these instructions.  Will watch your condition.  Will get help right away if you are not doing well or get worse. Document Released: 03/03/2005 Document Revised: 07/18/2013 Document Reviewed: 11/08/2012 [facility] Patient Information 2015 [address] [place], [place]. This information is not intended to replace advice given to you by your health care provider. Make sure you discuss any questions you have with your health care provider.  Constipation Constipation is when a person has fewer than three bowel movements a week, has difficulty having a bowel movement, or has stools that are dry, hard, or larger than normal. As people grow older, constipation is more common.  If you try to fix constipation with medicines that make you have a bowel movement (laxatives), the problem may get worse. Long-term laxative use may cause the muscles of the colon to become weak. A low-fiber diet, not taking in enough fluids, and taking certain medicines may make constipation worse.  CAUSES   Certain medicines,  such as antidepressants, pain medicine, iron supplements, antacids, and water pills.   Certain diseases, such as diabetes, irritable bowel syndrome (IBS), thyroid disease, or depression.   Not drinking enough water.   Not eating enough fiber-rich foods.   Stress or travel.   Lack of physical activity or exercise.   Ignoring the urge to have a bowel movement.   Using laxatives too much.  SIGNS AND SYMPTOMS   Having fewer than three bowel movements a week.   Straining to have a bowel movement.   Having stools that are hard, dry, or larger than normal.   Feeling full or bloated.   Pain in the lower abdomen.   Not feeling relief after having a bowel movement.  DIAGNOSIS  Your health care provider will take a medical history and perform a physical exam. Further testing may be done for severe constipation. Some tests may include:  A barium enema X-ray to examine your rectum, colon, and, sometimes, your small intestine.   A sigmoidoscopy to examine your lower colon.   A colonoscopy to examine your entire colon. TREATMENT  Treatment will depend on the severity of your constipation and what is causing it. Some dietary treatments include drinking more fluids and eating more fiber-rich foods. Lifestyle treatments may include regular exercise. If these diet and lifestyle recommendations do not help, your health care provider may recommend taking over-the-counter laxative medicines to help you have bowel movements. Prescription medicines may be prescribed if over-the-counter medicines do not work.  HOME CARE INSTRUCTIONS   Eat foods that have a lot of fiber, such as fruits, vegetables, whole grains, and beans.  Limit foods high in fat and processed sugars, such as french fries, hamburgers, cookies, candies, and soda.   A fiber supplement may be added to your diet if you cannot get enough fiber from foods.   Drink enough fluids to keep your urine clear or pale  yellow.   Exercise regularly or as directed by your health care provider.   Go to the restroom when you have the urge to go. Do not hold it.   Only take over-the-counter or prescription medicines as directed by your health care provider. Do not take other medicines for constipation without talking to your health care provider first.  SEEK IMMEDIATE MEDICAL CARE IF:   You have bright red blood in your stool.   Your constipation lasts for more than 4 days or gets worse.   You have abdominal or rectal pain.   You have thin, pencil-like stools.   You have unexplained weight loss. MAKE SURE YOU:   Understand these instructions.  Will watch your condition.  Will get help right away if you are not doing well or get worse. Document Released: 11/30/2003 Document Revised: 03/08/2013 Document Reviewed: 12/13/2012 [facility] Patient Information 2015 [place]. This information is not intended to replace advice given to you by your health care provider. Make sure you discuss any questions you have with your health care provider.

## 2014-06-16 NOTE — Progress Notes (Signed)
Per care management, patient has walker to be used at home. RN faxing outpatient PT rx to care management.

## 2014-06-16 NOTE — Progress Notes (Signed)
Patient discharged home with wife. RN discussed discharge instructions and prescriptions with patient. Patient states he understands. Prescriptions, letter for work, and discharge instructions given to patient. Patient escorted to car via wheelchair with staff assistance.

## 2014-06-16 NOTE — Progress Notes (Signed)
  Echocardiogram 2D Echocardiogram has been performed.  Maggy Wyble FRANCES 06/16/2014, 3:17 PM

## 2014-06-16 NOTE — Progress Notes (Signed)
UR completed 

## 2014-06-16 NOTE — Evaluation (Signed)
Physical Therapy Evaluation Patient Details Name: [name] [name] MRN: [id number] DOB: 20-Feb-1966 Today's Date: 06/16/2014   History of Present Illness  [name] [name] is a 49 y.o. male with past medical history of chronic back pain and reportedly previous MI. Patient came into the hospital complaining about left-sided weakness and slurred speech with accompanied nausea and dizziness, patient also mentions some chest pain radiates to his left arm and pt reports to this therapist that he also had a massive headache at time of admission. Pt urine + for  benzos, cocaine and THC  Clinical Impression  Patient presents with decreased independence and safety with mobility due to deficits listed in PT problem list.  He will benefit from follow up outpatient PT if able to get [facility] discount to address issues and maximize balance and mobility to allow return to work.  Also needs RW for home use.      Follow Up Recommendations Outpatient PT;Supervision - Intermittent    Equipment Recommendations  Rolling walker with 5" wheels    Recommendations for Other Services       Precautions / Restrictions Precautions Precautions: Fall Restrictions Weight Bearing Restrictions: No      Mobility  Bed Mobility Overal bed mobility: Modified Independent             General bed mobility comments: increased time  Transfers Overall transfer level: Modified independent Equipment used: None             General transfer comment: slow and UE support needed  Ambulation/Gait Ambulation/Gait assistance: Supervision;Min guard Ambulation Distance (Feet): 200 Feet (and 120' no device) Assistive device: Rolling walker (2 wheeled);None Gait Pattern/deviations: Step-through pattern;Trunk flexed;Decreased dorsiflexion - left;Decreased stride length   Gait velocity interpretation: Below normal speed for age/gender General Gait Details: without walker drags left toes on floor and limps when advancing right;  improves some with cues to strike heel first on left, then with walker improved more.  without walker after descending steps loss of balance with minassist to recover   Stairs Stairs: Yes Stairs assistance: Min assist Stair Management: Step to pattern;Alternating pattern;No rails (hand held assist) Number of Stairs: 5 General stair comments: cues for technique with step to pattern leading with strong leg up and weak leg down, but pt not consistently following directions so increased assist on descent due to leading with right foot down and left leg giving away; educated wife how to assist and in sequence  Wheelchair Mobility    Modified Rankin (Stroke Patients Only) Modified Rankin (Stroke Patients Only) Pre-Morbid Rankin Score: No symptoms Modified Rankin: Moderately severe disability     Balance Overall balance assessment: Needs assistance         Standing balance support: No upper extremity supported Standing balance-Leahy Scale: Fair Standing balance comment: static stance without UE support needed, can walk without UE support, but unsteady, better with bilat UE support                             Pertinent Vitals/Pain Pain Assessment: 0-10 Pain Score: 8  Pain Location: head and back Pain Descriptors / Indicators: Aching Pain Intervention(s): Monitored during session;Repositioned    Home Living Family/patient expects to be discharged to:: Private residence Living Arrangements: Spouse/significant other Available Help at Discharge: Family Type of Home: House Home Access: Stairs to enter Entrance Stairs-Rails: None Entrance Stairs-Number of Steps: 5   Home Equipment: None      Prior Function Level  of Independence: Independent         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     Hand Dominance   Dominant Hand: Right    Extremity/Trunk Assessment   Upper Extremity Assessment:  (pt moves arms for testing quite  stiffly, but with just spontanous use normal tone)           Lower Extremity Assessment: LLE deficits/detail;RLE deficits/detail RLE Deficits / Details: grossly WFL strength and ROM, mild tremor holding leg up antigravity LLE Deficits / Details: AROM WFL, strength hip flexion 4-/5, knee extension 4-/5, ankle DF 3+/5     Communication   Communication: No difficulties  Cognition Arousal/Alertness: Awake/alert Behavior During Therapy: Anxious Overall Cognitive Status: Within Functional Limits for tasks assessed                      General Comments General comments (skin integrity, edema, etc.): Patient educated on possibility of getting [facility] discount to help with allowing him to attend outpatient PT.      Exercises        Assessment/Plan    PT Assessment Patient needs continued PT services  PT Diagnosis Abnormality of gait;Generalized weakness   PT Problem List Decreased strength;Decreased mobility;Decreased safety awareness;Decreased knowledge of use of DME;Decreased balance;Impaired sensation;Decreased knowledge of precautions  PT Treatment Interventions DME instruction;Therapeutic exercise;Gait training;Balance training;Stair training;Functional mobility training;Therapeutic activities;Patient/family education   PT Goals (Current goals can be found in the Care Plan section) Acute Rehab PT Goals Patient Stated Goal: to return to work PT Goal Formulation: With patient/family Time For Goal Achievement: 06/23/14 Potential to Achieve Goals: Good    Frequency Min 4X/week   Barriers to discharge        Co-evaluation               End of Session Equipment Utilized During Treatment: Gait belt Activity Tolerance: Patient tolerated treatment well Patient left: with call bell/phone within reach;with family/visitor present      Functional Assessment Tool Used: Clinical Judgement Functional Limitation: Mobility: Walking and moving around Mobility: Walking and  Moving Around Current Status [phone](G8978): At least 20 percent but less than 40 percent impaired, limited or restricted Mobility: Walking and Moving Around Goal Status [phone](G8979): At least 1 percent but less than 20 percent impaired, limited or restricted Mobility: Walking and Moving Around Discharge Status [phone](G8980): At least 1 percent but less than 20 percent impaired, limited or restricted    Time: [id number] PT Time Calculation (min) (ACUTE ONLY): 22 min   Charges:   PT Evaluation $Initial PT Evaluation Tier I: 1 Procedure PT Treatments $Gait Training: 8-22 mins   PT G Codes:   PT G-Codes **NOT FOR INPATIENT CLASS** Functional Assessment Tool Used: Clinical Judgement Functional Limitation: Mobility: Walking and moving around Mobility: Walking and Moving Around Current Status ([phone]): At least 20 percent but less than 40 percent impaired, limited or restricted Mobility: Walking and Moving Around Goal Status [phone]([id number]): At least 1 percent but less than 20 percent impaired, limited or restricted Mobility: Walking and Moving Around Discharge Status [phone]([id number]): At least 1 percent but less than 20 percent impaired, limited or restricted    [facility] HospitalWYNN,CYNDI 06/16/2014, 4:53 PM  Emmettyndi Jayr Lupercio, [place]PT [phone] 06/16/2014

## 2014-06-18 NOTE — Discharge Summary (Signed)
Physician Discharge Summary  [name] [name] ZOX:[id number] DOB: [birth year] DOA: 06/14/2014  PCP: Joanna Hews, MD  Admit date: 06/14/2014 Discharge date: 06/16/2014  Time spent: 30 minutes  Recommendations for Outpatient Follow-up:  1. Follow up with PCP in one week.  2. Follow up with neurology as recommended.   Discharge Diagnoses:  Principal Problem:   Left-sided weakness Active Problems:   Chest pain   Chronic joint pain   Left arm weakness   Acute chest pain   Discharge Condition: improved.   Diet recommendation: regular diet.   Filed Weights   06/14/14 1124 06/14/14 1839  Weight: 90.776 kg (200 lb 2 oz) 90.776 kg (200 lb 2 oz)    History of present illness:  49 y.o. Male admitted for left sided weakness and TIA work up ordered. His work up has been negative  So far. He reports occasional headaches.   Hospital Course:  1. Left sided weakness: All symptoms are resolved  TIA work up ordered.  MRI and CT of the head and neck ordered and negative for significant pathology.  Resume aspirin.  His symptoms might be related to complicated migraine. He was started on prednisone, he refused to take it and refused any prescriptions of prednisone taper on discharge. Recommended outpatient follow up with neurology in one week.   2. Chronic back pain: Resume home meds.    3. Bipolar disorder: Anxious and resumed home dose xanax.    4. Chest pain: Serial enzymes negative. And EKG unremarkable. Repeat EKG today shows NSR.  Echocardiogram showed LVEF of 55% without any regional wall motion abnormalities, it also showed grade 1 diastolic dysfunction. His chest pain is completely resolved.   Drug abuse: UDS Positive for benzos, cocaine and marijuana.  Counseling given.    Procedures:  Echo  MRI brain    Consultations:  neurology  Discharge Exam: Filed Vitals:   06/16/14 1828  BP: 131/81  Pulse: 87  Temp: 98.4 F (36.9 C)  Resp: 16    General: alert  afebrile comfortable Cardiovascular: s1s2 Respiratory: ctab  Discharge Instructions   Discharge Instructions    Discharge instructions    Complete by:  As directed   Follow up with neurology in one week .  Follow up with community health clinic on Monday and follow up the results of the echocardiogram.          Discharge Medication List as of 06/16/2014  5:42 PM    START taking these medications   Details  gabapentin (NEURONTIN) 300 MG capsule Take 1 capsule (300 mg total) by mouth 2 (two) times daily., Starting 06/16/2014, Until Discontinued, Print      CONTINUE these medications which have NOT CHANGED   Details  ALPRAZolam (XANAX) 1 MG tablet Take 2 mg by mouth 4 (four) times daily. For anxiety, Until Discontinued, Historical Med    aspirin 81 MG tablet Take 81 mg by mouth daily., Until Discontinued, Historical Med    atomoxetine (STRATTERA) 40 MG capsule Take 40 mg by mouth daily., Until Discontinued, Historical Med    Multiple Vitamin (MULITIVITAMIN WITH MINERALS) TABS Take 1 tablet by mouth daily., Until Discontinued, Historical Med    oxyCODONE-acetaminophen (PERCOCET) 10-325 MG per tablet Take 1 tablet by mouth at bedtime as needed for pain., Until Discontinued, Historical Med    cyclobenzaprine (FLEXERIL) 10 MG tablet Take 1 tablet (10 mg total) by mouth 2 (two) times daily as needed for muscle spasms., Starting 03/05/2014, Until Discontinued, Print    traMADol Janean Sark)  50 MG tablet Take 1 tablet (50 mg total) by mouth every 6 (six) hours as needed., Starting 03/05/2014, Until Discontinued, Print      STOP taking these medications     ibuprofen (ADVIL,MOTRIN) 800 MG tablet        Allergies  Allergen Reactions  . Vicodin [Hydrocodone-Acetaminophen] Nausea And Vomiting   Follow-up Information    Follow up with [facility]. Go on 06/19/2014.   Why:  arrive at 08:30 for walk in appointment. Bring your discharge papers.   Contact  information:   [address] [place] [address] [phone]      Follow up with Xu,Jindong, MD In 1 week.   Specialty:  Neurology   Why:  for migrains.    Contact information:   [address] [place] [address] [phone]        The results of significant diagnostics from this hospitalization (including imaging, microbiology, ancillary and laboratory) are listed below for reference.    Significant Diagnostic Studies: Ct Angio Head W/cm &/or Wo Cm  06/14/2014   CLINICAL DATA:  Left-sided arm numbness and weakness, left facial droop, slurred speech, vision changes, and unsteady gait.  EXAM: CT ANGIOGRAPHY HEAD AND NECK  TECHNIQUE: Multidetector CT imaging of the head and neck was performed using the standard protocol during bolus administration of intravenous contrast. Multiplanar CT image reconstructions and MIPs were obtained to evaluate the vascular anatomy. Carotid stenosis measurements (when applicable) are obtained utilizing NASCET criteria, using the distal internal carotid diameter as the denominator.  CONTRAST:  50 mL Omnipaque 350  COMPARISON:  Noncontrast head CT earlier today  FINDINGS: CT HEAD  Brain: There is no evidence of acute cortical infarct, intracranial hemorrhage, mass, midline shift, or extra-axial fluid collection. Ventricles and sulci are normal.  Calvarium and skull base: No aggressive osseous lesions identified.  Paranasal sinuses: Clear.  Orbits: Unremarkable.  CTA NECK  Aortic arch: 3 vessel aortic arch. Brachiocephalic and subclavian arteries are widely patent, with minimal calcification noted at the left subclavian artery origin.  Right carotid system: Patent without evidence of stenosis, dissection, or aneurysm.  Left carotid system: Patent without evidence of stenosis, dissection, or aneurysm. Minimal calcified plaque at the carotid bifurcation.  Vertebral arteries: Patent without stenosis. Right vertebral artery is  minimally larger than the left.  Skeleton: Mild multilevel cervical disc degeneration and facet arthrosis.  Other neck: Prominent periapical lucency about the left mandibular third molar.  CTA HEAD  Anterior circulation: Internal carotid arteries are patent from skullbase to carotid termini without stenosis. ACAs and MCAs are patent with mild branch vessel irregularity but no significant proximal stenosis. No intracranial aneurysm is identified.  Posterior circulation: Intracranial vertebral arteries are patent to the basilar bilaterally without stenosis. PICA and SCA origins are patent. Basilar artery is patent without stenosis. PCAs are patent with mild irregularity involving the left P1 segment and of P2 and more distal branch vessels bilaterally. There is mild narrowing versus artifact of the distal left P1 segment. Posterior communicating arteries are not identified.  Venous sinuses: Patent.  Anatomic variants: None.  Delayed phase: No abnormal enhancement.  IMPRESSION: 1. No evidence of major intracranial arterial occlusion or significant proximal stenosis. Anterior and posterior circulation branch vessel irregularity may reflect small vessel atherosclerosis. 2. No cervical carotid or vertebral artery stenosis. 3. No evidence of acute intracranial abnormality or mass.   Electronically Signed   By: Sebastian Ache   On: 06/14/2014 13:58  Ct Head Wo Contrast  06/14/2014   CLINICAL DATA:  Code stroke, headache, dizziness, left arm numbness  EXAM: CT HEAD WITHOUT CONTRAST  TECHNIQUE: Contiguous axial images were obtained from the base of the skull through the vertex without intravenous contrast.  COMPARISON:  None.  FINDINGS: No skull fracture is noted. Paranasal sinuses and mastoid air cells are unremarkable.  No intracranial hemorrhage, mass effect or midline shift. No definite acute cortical infarction. No mass lesion is noted on this unenhanced scan.  IMPRESSION: No acute intracranial abnormality. No definite  acute cortical infarction. These results were called by telephone at the time of interpretation on 06/14/2014 at 11:22 am to Dr. Thad Rangereynolds, who verbally acknowledged these results.   Electronically Signed   By: Natasha MeadLiviu  Pop M.D.   On: 06/14/2014 11:22   Ct Angio Neck W/cm &/or Wo/cm  06/14/2014   CLINICAL DATA:  Left-sided arm numbness and weakness, left facial droop, slurred speech, vision changes, and unsteady gait.  EXAM: CT ANGIOGRAPHY HEAD AND NECK  TECHNIQUE: Multidetector CT imaging of the head and neck was performed using the standard protocol during bolus administration of intravenous contrast. Multiplanar CT image reconstructions and MIPs were obtained to evaluate the vascular anatomy. Carotid stenosis measurements (when applicable) are obtained utilizing NASCET criteria, using the distal internal carotid diameter as the denominator.  CONTRAST:  50 mL Omnipaque 350  COMPARISON:  Noncontrast head CT earlier today  FINDINGS: CT HEAD  Brain: There is no evidence of acute cortical infarct, intracranial hemorrhage, mass, midline shift, or extra-axial fluid collection. Ventricles and sulci are normal.  Calvarium and skull base: No aggressive osseous lesions identified.  Paranasal sinuses: Clear.  Orbits: Unremarkable.  CTA NECK  Aortic arch: 3 vessel aortic arch. Brachiocephalic and subclavian arteries are widely patent, with minimal calcification noted at the left subclavian artery origin.  Right carotid system: Patent without evidence of stenosis, dissection, or aneurysm.  Left carotid system: Patent without evidence of stenosis, dissection, or aneurysm. Minimal calcified plaque at the carotid bifurcation.  Vertebral arteries: Patent without stenosis. Right vertebral artery is minimally larger than the left.  Skeleton: Mild multilevel cervical disc degeneration and facet arthrosis.  Other neck: Prominent periapical lucency about the left mandibular third molar.  CTA HEAD  Anterior circulation: Internal carotid  arteries are patent from skullbase to carotid termini without stenosis. ACAs and MCAs are patent with mild branch vessel irregularity but no significant proximal stenosis. No intracranial aneurysm is identified.  Posterior circulation: Intracranial vertebral arteries are patent to the basilar bilaterally without stenosis. PICA and SCA origins are patent. Basilar artery is patent without stenosis. PCAs are patent with mild irregularity involving the left P1 segment and of P2 and more distal branch vessels bilaterally. There is mild narrowing versus artifact of the distal left P1 segment. Posterior communicating arteries are not identified.  Venous sinuses: Patent.  Anatomic variants: None.  Delayed phase: No abnormal enhancement.  IMPRESSION: 1. No evidence of major intracranial arterial occlusion or significant proximal stenosis. Anterior and posterior circulation branch vessel irregularity may reflect small vessel atherosclerosis. 2. No cervical carotid or vertebral artery stenosis. 3. No evidence of acute intracranial abnormality or mass.   Electronically Signed   By: Sebastian AcheAllen  Grady   On: 06/14/2014 13:58   Mr Brain Wo Contrast  06/15/2014   CLINICAL DATA:  49 year old male with history of myocardial infarction presenting with headache and slight tingling left hand. Suspect complicated migraine. Subsequent encounter.  EXAM: MRI HEAD WITHOUT CONTRAST  TECHNIQUE: Multiplanar,  multiecho pulse sequences of the brain and surrounding structures were obtained without intravenous contrast.  COMPARISON:  CT angiogram 06/14/2014.  No comparison MR.  FINDINGS: No acute infarct.  No intracranial hemorrhage.  No hydrocephalus.  No intracranial mass lesion noted on this unenhanced exam.  Major intracranial vascular structures are patent.  Cervical medullary junction, pituitary region, pineal region and orbital structures unremarkable.  IMPRESSION: Negative unenhanced brain MR as noted above.   Electronically Signed   By:  Lacy Duverney M.D.   On: 06/15/2014 11:35   Ct Angio Chest Aorta W/cm &/or Wo/cm  06/14/2014   CLINICAL DATA:  Acute chest pain  EXAM: CT ANGIOGRAPHY CHEST WITH CONTRAST  TECHNIQUE: Multidetector CT imaging of the chest was performed using the standard protocol during bolus administration of intravenous contrast. Multiplanar CT image reconstructions and MIPs were obtained to evaluate the vascular anatomy.  CONTRAST:  OMNIPAQUE IOHEXOL 350 MG/ML SOLN  COMPARISON:  CT chest 10/16/2013  FINDINGS: Negative for pulmonary embolism. Negative for aortic aneurysm or dissection. Heart size is normal. No pericardial effusion.  Negative for mass or adenopathy. Lungs are clear. Negative for pneumonia or effusion.  Negative upper abdomen  Review of the MIP images confirms the above findings.  IMPRESSION: Negative for pulmonary embolism. No aortic dissection. No acute abnormality.   Electronically Signed   By: Marlan Palau M.D.   On: 06/14/2014 13:40    Microbiology: No results found for this or any previous visit (from the past 240 hour(s)).   Labs: Basic Metabolic Panel:  Recent Labs Lab 06/14/14 1109 06/14/14 1117 06/15/14 0615 06/16/14 0606  NA 139 140 141 140  K 3.9 3.8 4.0 4.1  CL 106 101 107 106  CO2 30  --  27 30  GLUCOSE 110* 107* 92 95  BUN CREATININE 1.26 1.10 1.10 1.09  CALCIUM 8.8  --  8.3* 8.2*   Liver Function Tests:  Recent Labs Lab 06/14/14 1109  AST 20  ALT 14  ALKPHOS 102  BILITOT 0.7  PROT 6.4  ALBUMIN 4.0   No results for input(s): LIPASE, AMYLASE in the last 168 hours. No results for input(s): AMMONIA in the last 168 hours. CBC:  Recent Labs Lab 06/14/14 1109 06/14/14 1117 06/15/14 0615 06/16/14 0606  WBC 9.3  --  6.7 5.6  NEUTROABS 6.3  --   --   --   HGB 14.8 15.3 13.9 13.1  HCT 44.1 45.0 41.7 39.9  MCV 89.5  --  89.9 90.1  PLT 185  --  191 182   Cardiac Enzymes:  Recent Labs Lab 06/14/14 1349 06/14/14 2108 06/15/14 0010  06/15/14 0615  TROPONINI <0.03 <0.03 <0.03 <0.03   BNP: BNP (last 3 results) No results for input(s): BNP in the last 8760 hours.  ProBNP (last 3 results) No results for input(s): PROBNP in the last 8760 hours.  CBG: No results for input(s): GLUCAP in the last 168 hours.     SignedKathlen Mody  [facility] 06/18/2014, 11:31 PM

## 2014-06-21 ENCOUNTER — Emergency Department
Admit: 2014-06-21 | Disposition: A | Discharge status: Home / Self Care | Payer: Self-pay | Admitting: Emergency Medicine

## 2014-06-21 LAB — COMPREHENSIVE METABOLIC PANEL
ALT: 77 U/L — AB
AST: 54 U/L — AB
Albumin: 4.4 g/dL
Alkaline Phosphatase: 101 U/L
Anion Gap: 8 (ref 7–16)
BILIRUBIN TOTAL: 0.3 mg/dL
BUN: 13 mg/dL
CALCIUM: 9.3 mg/dL
Chloride: 108 mmol/L
Co2: 27 mmol/L
Creatinine: 0.94 mg/dL
EGFR (African American): >60
EGFR (Non-African Amer.): >60
Glucose: 110 mg/dL — ABNORMAL HIGH
Potassium: 3.6 mmol/L
Sodium: 143 mmol/L
Total Protein: 7.4 g/dL

## 2014-06-21 LAB — DRUG SCREEN, URINE
AMPHETAMINES, UR SCREEN: NEGATIVE
Barbiturates, Ur Screen: NEGATIVE
Benzodiazepine, Ur Scrn: POSITIVE
Cannabinoid 50 Ng, Ur ~~LOC~~: POSITIVE
Cocaine Metabolite,Ur ~~LOC~~: POSITIVE
MDMA (ECSTASY) UR SCREEN: NEGATIVE
Methadone, Ur Screen: NEGATIVE
Opiate, Ur Screen: NEGATIVE
PHENCYCLIDINE (PCP) UR S: NEGATIVE
Tricyclic, Ur Screen: NEGATIVE

## 2014-06-21 LAB — URINALYSIS, COMPLETE
BLOOD: NEGATIVE
Bacteria: NONE SEEN
Bilirubin,UR: NEGATIVE
Glucose,UR: NEGATIVE mg/dL (ref 0–75)
KETONE: NEGATIVE
Leukocyte Esterase: NEGATIVE
Nitrite: NEGATIVE
PROTEIN: NEGATIVE
Ph: 7 (ref 4.5–8.0)
RBC,UR: 5 /HPF (ref 0–5)
SPECIFIC GRAVITY: 1.021 (ref 1.003–1.030)
Squamous Epithelial: NONE SEEN

## 2014-06-21 LAB — CBC
HCT: 46.8 % (ref 40.0–52.0)
HGB: 15.4 g/dL (ref 13.0–18.0)
MCH: 29.5 pg (ref 26.0–34.0)
MCHC: 33 g/dL (ref 32.0–36.0)
MCV: 89 fL (ref 80–100)
Platelet: 239 10*3/uL (ref 150–440)
RBC: 5.24 10*6/uL (ref 4.40–5.90)
RDW: 14.1 % (ref 11.5–14.5)
WBC: 10.1 10*3/uL (ref 3.8–10.6)

## 2014-06-21 LAB — SALICYLATE LEVEL: Salicylates, Serum: <4 mg/dL

## 2014-06-21 LAB — ACETAMINOPHEN LEVEL

## 2014-06-21 LAB — ETHANOL

## 2014-06-22 LAB — TROPONIN I: Troponin-I: <0.03 ng/mL

## 2014-06-26 ENCOUNTER — Encounter: Payer: Self-pay | Admitting: Internal Medicine

## 2014-06-26 ENCOUNTER — Ambulatory Visit: Payer: Self-pay | Attending: Internal Medicine | Admitting: Internal Medicine

## 2014-06-26 VITALS — BP 123/81 | HR 85 | Temp 98.0°F | Resp 16 | Ht 73.0 in | Wt 198.0 lb

## 2014-06-26 DIAGNOSIS — F1910 Other psychoactive substance abuse, uncomplicated: Secondary | ICD-10-CM

## 2014-06-26 DIAGNOSIS — M5416 Radiculopathy, lumbar region: Secondary | ICD-10-CM | POA: Insufficient documentation

## 2014-06-26 DIAGNOSIS — F419 Anxiety disorder, unspecified: Secondary | ICD-10-CM

## 2014-06-26 DIAGNOSIS — F909 Attention-deficit hyperactivity disorder, unspecified type: Secondary | ICD-10-CM

## 2014-06-26 DIAGNOSIS — M5410 Radiculopathy, site unspecified: Secondary | ICD-10-CM

## 2014-06-26 MED ORDER — TRAMADOL HCL 50 MG PO TABS: 50.0000 mg | ORAL_TABLET | Freq: Two times a day (BID) | ORAL | Status: DC

## 2014-06-26 MED ORDER — CYCLOBENZAPRINE HCL 10 MG PO TABS: 10.0000 mg | ORAL_TABLET | Freq: Three times a day (TID) | ORAL | Status: DC | PRN

## 2014-06-26 NOTE — Progress Notes (Signed)
Patient ID: [name] [name], male   DOB: 1965/06/30, 49 y.o.   MRN: [id number]  OZH:[id number]  GEX:[id number]  DOB - 1965/06/30  CC:  Chief Complaint  Patient presents with  . Establish Care       HPI: [name] [name] is a 49 y.o. male here today to establish medical care. He was recently discharged from the hospital for left-sided weakness, chest pain, chronic back pain. Patient is at that time found to have a positive urine drug screen for benzos, cocaine, marijuana.. Patient reports that he occasionally uses cocaine when he feels stressed or is unable to get his ADHD medication. [name] [name] currently has a diagnoses of ADHD, anxiety, bipolar and is being treated by [facility] Omelia BlackwaterHeaden [facility]. He reports these been off the Strattera for several months due to cost of medication and has only been taking Xanax for anxiety.   Today patient reports that he continues to have lower back pain with left-sided weakness and numbness. Pain is sharp and shoots down to bilateral feet. Aggravated by prolonged sitting, standing, walking. He reports intermittent left-sided numbness for several months. 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 As a result he has pins and screws holding his right foot together. [facility] him that he needed to have a ankle fusion but he was uninsured.  Patient is concerned about vascular issues of right leg due to intermittent swelling and discoloration of the foot. He currently wears compression socks to assist with circulatory issues.  Family history of colon cancer. Has never had colonoscopy.   Patient has No headache, No chest pain, No abdominal pain - No Nausea, No new weakness tingling or numbness, No Cough - SOB.  Allergies  Allergen Reactions  . Vicodin [Hydrocodone-Acetaminophen] Nausea And Vomiting   Past Medical History  Diagnosis Date  . Back pain   . MI (myocardial infarction)     at age 49  . Arthritis     Current Outpatient Prescriptions on File Prior to Visit  Medication Sig Dispense Refill  . ALPRAZolam (XANAX) 1 MG tablet Take 2 mg by mouth 4 (four) times daily. For anxiety    . aspirin 81 MG tablet Take 81 mg by mouth daily.    Marland Kitchen. gabapentin (NEURONTIN) 300 MG capsule Take 1 capsule (300 mg total) by mouth 2 (two) times daily. 60 capsule 0  . Multiple Vitamin (MULITIVITAMIN WITH MINERALS) TABS Take 1 tablet by mouth daily.    Marland Kitchen. oxyCODONE-acetaminophen (PERCOCET) 10-325 MG per tablet Take 3 tablets by mouth at bedtime as needed for pain. 30 tablet 0  . atomoxetine (STRATTERA) 40 MG capsule Take 40 mg by mouth daily.    . cyclobenzaprine (FLEXERIL) 10 MG tablet Take 1 tablet (10 mg total) by mouth 2 (two) times daily as needed for muscle spasms. (Patient not taking: Reported on 06/26/2014) 20 tablet 0  . traMADol (ULTRAM) 50 MG tablet Take 1 tablet (50 mg total) by mouth every 6 (six) hours as needed. (Patient not taking: Reported on 06/26/2014) 15 tablet 0   No current facility-administered medications on file prior to visit.   Family History  Problem Relation Age of Onset  . Heart failure Mother   . Hypertension Mother   . Stroke Mother    History   Social History  . Marital Status: Single    Spouse Name: N/A  . Number of Children: N/A  . Years of Education: N/A   Occupational  History  . Not on file.   Social History Main Topics  . Smoking status: Current Every Day Smoker    Types: Cigarettes  . Smokeless tobacco: Not on file  . Alcohol Use: No  . Drug Use: Not on file  . Sexual Activity: Not on file   Other Topics Concern  . Not on file   Social History Narrative    Review of Systems  Gastrointestinal:       No bowel or bladder dysfunction  Musculoskeletal: Positive for back pain and joint pain.  Neurological: Positive for tingling.  Psychiatric/Behavioral: Positive for substance abuse (per HPI). Negative for depression and suicidal ideas. The patient is  nervous/anxious. The patient does not have insomnia.   All other systems reviewed and are negative.     Objective:   Filed Vitals:   06/26/14 0946  BP: 123/81  Pulse: 85  Temp: 98 F (36.7 C)  Resp: 16    Physical Exam  Constitutional: He is oriented to person, place, and time.  Cardiovascular: Normal rate, regular rhythm and normal heart sounds.   Pulmonary/Chest: Effort normal and breath sounds normal.  Abdominal: Soft. Bowel sounds are normal.  Musculoskeletal: He exhibits tenderness (lumbar spine).  + straight leg raises bilaterally  Neurological: He is alert and oriented to person, place, and time. No cranial nerve deficit.  Very strong grip bilaterally, no weakness of upper or lower extremities   Skin: Skin is warm and dry.     Bony protrusion, TTP      Lab Results  Component Value Date   WBC 5.6 06/16/2014   HGB 13.1 06/16/2014   HCT 39.9 06/16/2014   MCV 90.1 06/16/2014   PLT 182 06/16/2014   Lab Results  Component Value Date   CREATININE 1.09 06/16/2014   BUN 9 06/16/2014   NA 140 06/16/2014   K 4.1 06/16/2014   CL 106 06/16/2014   CO2 30 06/16/2014    Lab Results  Component Value Date   HGBA1C 5.4 06/14/2014   Lipid Panel  No results found for: CHOL, TRIG, HDL, CHOLHDL, VLDL, LDLCALC     Assessment and plan:   [name] [name] was seen today for establish care.  Diagnoses and all orders for this visit:  Radicular low back pain Orders: -    Refill cyclobenzaprine (FLEXERIL) 10 MG tablet; Take 1 tablet (10 mg total) by mouth 3 (three) times daily as needed for muscle spasms. -    Refill traMADol (ULTRAM) 50 MG tablet; Take 1 tablet (50 mg total) by mouth 2 (two) times daily. I have refilled patient's pain medication with the understanding that medications to only be used for pain after recreational use. Patient will apply to get orange card so a referral to orthopedic may be placed. Ortho will then address patients pain concerns. Due to his  history, tramadol will not be a frequent refill  Attention deficit hyperactivity disorder (ADHD), unspecified ADHD type As per patient. He states that he does not do well with other ADHD medications due to side effects. I have asked him to bring written prescriptions to [facility] to see if he could possibly get on with pass program.  Anxiety And ex.plained the patient that all refills of Xanax will have to come from his psychiatrist.   Polysubstance abuse The patient resources for substance abuse help and went over consequences of long-term drug use   May follow-up as needed  The patient was given clear instructions to go to ER or return  to medical center if symptoms don't improve, worsen or new problems develop. The patient verbalized understanding. The patient was told to call to get lab results if they haven't heard anything in the next week.     Holland Commons, NP-C [facility] [phone] 06/26/2014, 9:59 AM

## 2014-06-26 NOTE — Patient Instructions (Signed)
Our pharmacy or the [facility]

## 2014-06-26 NOTE — Progress Notes (Signed)
Pt is here to establish care. Pt reports having chronic pain in his lower back, legs and his right ankle. Pt was recently in the ED and he suffered a mini stroke.

## 2014-06-28 ENCOUNTER — Telehealth: Payer: Self-pay

## 2014-06-28 NOTE — Telephone Encounter (Signed)
Returned patient phone call Patient not available Left message on voice mail to return our call 

## 2014-06-28 NOTE — Telephone Encounter (Signed)
Patient just had medication filled on 4/11. He should not be out of medication

## 2014-06-28 NOTE — Telephone Encounter (Signed)
Patient called requesting a refill on his tramadol Can we fill this for the patient

## 2014-06-29 ENCOUNTER — Other Ambulatory Visit: Payer: Self-pay | Admitting: Internal Medicine

## 2014-06-29 ENCOUNTER — Ambulatory Visit: Payer: Self-pay | Attending: Internal Medicine

## 2014-06-29 DIAGNOSIS — E785 Hyperlipidemia, unspecified: Secondary | ICD-10-CM

## 2014-06-29 LAB — LIPID PANEL
CHOLESTEROL: 123 mg/dL (ref 0–200)
HDL: 43 mg/dL (ref >=40)
LDL CALC: 69 mg/dL (ref 0–99)
TRIGLYCERIDES: 54 mg/dL (ref <150)
Total CHOL/HDL Ratio: 2.9 Ratio
VLDL: 11 mg/dL (ref 0–40)

## 2014-07-04 ENCOUNTER — Telehealth: Payer: Self-pay | Admitting: *Deleted

## 2014-07-04 NOTE — Telephone Encounter (Signed)
Pt is aware of his lab results. 

## 2014-07-04 NOTE — Telephone Encounter (Signed)
-----   Message from Ambrose FinlandValerie A Keck, NP sent at 06/30/2014  6:10 PM EDT ----- Cholesterol looks great

## 2014-07-07 ENCOUNTER — Telehealth: Payer: Self-pay | Admitting: *Deleted

## 2014-07-07 ENCOUNTER — Ambulatory Visit: Payer: Self-pay | Admitting: Internal Medicine

## 2014-07-07 NOTE — Telephone Encounter (Signed)
Patient had called in about his leg pain.  I returned his call and left a HIPPA compliant message to return my call

## 2014-07-09 NOTE — Discharge Summary (Signed)
PATIENT NAME:  [name] [name], [name] [name] MR#:  [id number] DATE OF BIRTH:  [birth year]  DATE OF ADMISSION:  03/19/2011 DATE OF DISCHARGE:  03/20/2011  ADMITTING DIAGNOSIS: Chest pain.   DISCHARGE DIAGNOSES:  1. Chest pain of unclear etiology at this time.  2. Polysubstance abuse.  3. Cocaine, benzodiazepine, cannabis, tobacco abuse.  4. Right foot pain chronic, status post right ankle fusion in remote past.  5. Depression.   DISCHARGE CONDITION: Stable.   DISCHARGE MEDICATIONS: Patient is to resume her previous outpatient medications which are:  1. Alprazolam 2 mg daily as needed.  2. Wellbutrin XL 150 mg p.o. daily.  3. Multivitamins once daily.   DIET: Regular.   PHYSICAL ACTIVITY LIMITATIONS: As tolerated.   FOLLOW UP: Follow-up appointment with Dr. Juliann Paresallwood in one week after discharge as well as to establish primary care physician and be seen at [facility] in a few weeks after discharge.  CONSULTANTS: 1. Dr. Maryruth BunKapur. 2. Care management.   LABORATORY, DIAGNOSTIC AND RADIOLOGICAL DATA: Chest x-ray, portable single view 03/19/3011: No acute changes identified. Myoview stress test by Dr. Juliann Paresallwood is negative for inducible cardiac ischemia. Full test results are still pending at time of dictation.   Lab data 03/19/2011 showed sodium 146, otherwise unremarkable BMP. Alcohol level was 0.079. Cardiac enzymes, first set, as well as subsequent two more sets, were within normal limits. Urine drug screen was positive for benzodiazepines, cocaine as well as cannabinoids. CBC was within normal limits. Urinalysis was unremarkable. Patient's EKG showed sinus rhythm at rate of 90, occasional premature ventricular complexes, nonspecific T wave abnormality was noted. Chest x-ray was unremarkable.   HISTORY OF PRESENT ILLNESS: Patient is a 49 year old Caucasian male with past medical history significant for history of depression, history of polysubstance abuse, who presented to the hospital with complaints  of chest pain. Please refer to Dr. Doreatha MartinKamran Lateef's admission note on 03/19/2011. On arrival to Emergency Room patient's temperature 97, pulse 82, respiration rate 18, blood pressure 110/68, saturation 96% on room air. Physical examination was unremarkable.   HOSPITAL COURSE: Patient was admitted to the hospital. His cardiac enzymes were cycled and Myoview stress test was ordered. Patient underwent Myoview stress test which was reported by Dr. Juliann Paresallwood upon telephone conversation as negative for inducible ischemia, however, full results are still pending. It was felt the patient's chest pain was very unlikely cardiac and patient is being discharged home. Upon discharge home patient's temperature is 98, pulse 85, respiration rate 20, blood pressure 113/75, saturation was 97% to 100% on room air at rest. Patient is to follow up with Dr. Juliann Paresallwood in case he has any recurrence of his symptoms.   Patient has history of polysubstance abuse. In fact his urine drug screen was positive for cocaine, benzodiazepine as well as cannabinoids. He also admitted of using tobacco smoking. He was requested to be evaluated by a psychiatrist, however, he rudely refused Dr. Shelda AltesKapur's offered help. Patient apparently stated to Dr. Maryruth BunKapur that he relapsed just recently after two years of sobriety, however, in Emergency Room visit in October it was noted that apparently patient came requesting detox of opioids. However, patient declined to talk with Dr. Maryruth BunKapur in regards to both substance abuse and preferred to [facility] if needed. Dr. Maryruth BunKapur felt that patient's  medication regimen using Xanax in combination with Wellbutrin would be the best therapy especially with comorbid substance abuse such as cocaine use. However, patient did not want any suggestions and said that  he would speak only to Dr. Omelia Blackwater with whom he had established outpatient psychiatry treatment and according to Dr. Maryruth Bun patient also  had multiple opioid prescriptions from different providers including [facility] as well as [facility]. He apparently also came to Emergency Room at [facility] in October wanting help to get off opioids so Dr. Maryruth Bun felt that we would should limit opioid exposure as much as possible. For this reason patient will not be discharged home on any opioids. He is to resume his medications which are given by his primary care physician. Patient is being discharged as mentioned above with above-mentioned medications and follow up. His vital signs on day of discharge are stable.   TIME SPENT: 40 minutes.  ____________________________ [name] Caper, MD rv:cms D: 03/20/2011 19:48:53 ET T: 03/24/2011 09:24:41 ET JOB#: [id number]  cc: [name] Caper, MD, <Dictator> [facility] [name] Barge MD ELECTRONICALLY SIGNED 04/27/2011 19:59

## 2014-07-09 NOTE — H&P (Signed)
PATIENT NAME:  [name], [name] MR#:  [id number] DATE OF BIRTH:  1965/06/02  DATE OF ADMISSION:  05/07/2011  REFERRING PHYSICIAN: Dr. Manson Passey    PRIMARY CARE PHYSICIAN: None.   PRESENTING COMPLAINT: Chest pain, syncope.   HISTORY OF PRESENT ILLNESS: Mr. [name] [name] is a 49 year old gentleman with history of depression, anxiety, and panic disorder who represents with complaints of chest pain. He describes his chest pain as starting in the center of his chest, "severe", with radiation to his left arm associated with left arm numbness, nausea, vomiting, diaphoresis, and shortness of breath. He also reports a syncopal episode with difficulty talking. Reports that his girlfriend witnessed the episode. He was on his way home from a trip and he was a passenger in the car ride. He reports having a recurrent episode in the evening of exact same symptoms and was brought in for evaluation by his girlfriend. The patient also reports that he was evaluated the beginning of January where he was admitted from 01/02 to 03/20/2011 for also chest pain and reports that symptoms were exactly the same as presentation. Currently denies any chest pain, palpitations, presyncope, or recurrent syncope.   PAST MEDICAL HISTORY:  1. Anxiety/depression.  2. Panic attacks.  3. Admitted 01/02 to 03/20/2011 for work-up of chest pain, unclear etiology, but possibly also related to anxiety at the time.  4. Chronic right foot pain, status post surgery.  5. Polysubstance abuse with cocaine, benzodiazepine, cannabis, and tobacco.  6. Obesity.  7. Family history of early cardiac disease.   PAST SURGICAL HISTORY: Right ankle surgery.   ALLERGIES: No known drug allergies.    MEDICATIONS:  1. Alprazolam 2 mg q.i.d.  2. Wellbutrin XL 150 mg daily.  3. Multivitamin daily.  4. Percocet 10/325 1 to 3 tablets daily as needed.  5. The patient is not taking aspirin.   FAMILY HISTORY: Mother died of stroke. She had heart disease, diabetes,  hypertension. Maternal grandmother had hypertension and diabetes. Father with heart disease. Brother with heart disease in his 72's.   SOCIAL HISTORY: He lives in [place]. He used to smoke 2-1/2 packs a day but on the Wellbutrin has cut down to 1 pack per day. No alcohol. He has history of drug abuse but reports that he has been abstinent for greater than two years, however, based on records from last admission he has actually positive urine drug screen.  REVIEW OF SYSTEMS: CONSTITUTIONAL: No fevers. He endorses nausea and vomiting. EYES: No glaucoma or cataracts. ENT: No epistaxis or discharge. RESPIRATORY: Endorses shortness of breath. No cough or wheezing. CARDIOVASCULAR: As per history of present illness. GI: Endorses nausea and vomiting. No diarrhea, abdominal pain, hematemesis, or melena. GU: No dysuria or hematuria. ENDOCRINE: No polyuria or polydipsia. HEME: No easy bleeding. SKIN: No ulcers. MUSCULOSKELETAL: He has chronic right foot and ankle pain. NEUROLOGIC: Has numbness of the left arm as per history of present illness. Denies any history of strokes or seizure. PSYCH: He endorses depression and anxiety with increased stressors. Reports that he is in the middle of a child custody case. He is near the one year anniversary of his mother's death and almost two anniversary of his grandmother's death. He is having to deal with his pain and also housing issues.   PHYSICAL EXAMINATION:   VITAL SIGNS: Temperature 98, pulse 86, respiratory rate 18, blood pressure 141/88, sating at 97% on room air.   GENERAL: Lying in bed in no apparent distress.   HEENT: Normocephalic, atraumatic. Pupils equal,  symmetric, nonicteric. Nares without discharge. Moist mucous membrane.   NECK: Soft and supple. No adenopathy or JVP.   CARDIOVASCULAR: Non-tachy. No murmurs, rubs, or gallops.   LUNGS: Clear to auscultation bilaterally. No use of accessory muscles or increased respiratory effort.   ABDOMEN: Soft,  nontender. Positive bowel sounds. No mass appreciated.   EXTREMITIES: No edema. Dorsal pedis pulses intact.   MUSCULOSKELETAL: No joint effusion.   SKIN: No ulcers.   NEUROLOGIC: No dysarthria or aphasia. Symmetrical strength. No focal deficits.   PSYCH: He is alert and oriented. The patient is cooperative but he is very anxious during the entire exam.   ASSESSMENT AND PLAN: Mr. [name] [name] is a 49 year old gentleman with history of tobacco abuse, remote cocaine abuse, depression/anxiety, panic attacks, and family history of early cardiac disease presenting with reports of severe chest pain with radiation to the left arm and numbness of left arm followed by syncope.  1. Chest pain with typical and atypical features and reports of syncope. Difficult to assess what's anxiety related versus true symptoms, again last seen in the beginning of January his Myoview was negative. He was to follow-up with Dr. Juliann Paresallwood upon discharge but did not do so because of financial issues. His urine drug screen on last admission was positive for cocaine, benzodiazepine, and cannabis, although he denies usage and has been abstinent for greater than two years. I suspect that his symptoms are driven by pain and anxiety with increased stressors but he does have risk factors and more than likely will be returning for same issues. Will have Cardiology evaluate again and maybe some utility with definitive study of his coronary vessels. Hold off on beta-blocker given his drug history. Start on aspirin. Continue nitro patch, oxygen. Send TSH and A1c. His LDL was 70 and HDL of 30 on last admission.  2. Polysubstance abuse. As above, urine drug screen. Start nicotine patch.  3. Depression/anxiety. Restart Wellbutrin and alprazolam.  4. Based on last discharge, the patient was actually not discharged on any narcotics although he reports taking Percocet 10/325. Will hold off on narcotic for now and use Tylenol for pain.  5. Prophylaxis. On  Protonix and aspirin.   TIME SPENT: Approximately 50 minutes spent on patient care.   ____________________________ Reuel DerbyAlounthith Blaze Nylund, MD ap:drc D: 05/07/2011 06:23:44 ET T: 05/07/2011 07:27:56 ET JOB#: [id number]  cc: Pearlean BrownieAlounthith Myrlene Riera, MD, <Dictator> Reuel DerbyALOUNTHITH Jaslene Marsteller MD ELECTRONICALLY SIGNED 05/13/2011 0:49

## 2014-07-09 NOTE — Consult Note (Signed)
Brief Consult Note: Diagnosis: Cocaine Abuse, Opiod Dependence, Benzodiazepine Dependence.   Patient was seen by consultant.   Comments: Mr. [name] [name] is a 49 y/o Caucasian male with a history of polysubstance abuse who was admitted to the medicine floor with chest pain in the context of cocaine use. The patient says he relapsed on cocaine on New Years Eve after two years of sobriety but this contraindicates an ER visit in October of this year for wanting help detoxing from opiods. The patient declined to talk to this writer with regards to substance use reporting that he did not feel like he needed help from this insitution and would prefer to go to [facility] substance abuse treatment if needed.   I do not agree with this patient's current psychotropic medication regimen of using Xanax in combination with Wellbutrin and with comorbid substance use (cocaine) but the patient was not opne to any suggestions and said he would only speak with Dr. Omelia BlackwaterHeaden with whom he has established outpatient psychiatric treatment with. Of note, the patient has multiple opiod prescriptions from multiple different providers and [facility] and [facility]. He [facility] in October of this year wanting help to get off the opiods. Would try to limit opiods as much as possible  If this patient changes his mind and is willing to agree to having a psychiatry consult, will return to do full consult  Electronic Signatures: Caryn SectionKapur, Tanji Storrs (MD)  (Signed 03-Jan-13 05:38)  Authored: Brief Consult Note   Last Updated: 03-Jan-13 05:38 by Caryn SectionKapur, Jaxtin Raimondo (MD)

## 2014-07-09 NOTE — Discharge Summary (Signed)
PATIENT NAME:  [name] [name], Drae K MR#:  [id number] DATE OF BIRTH:  11-Mar-1966  DATE OF ADMISSION:  05/07/2011 DATE OF DISCHARGE:  05/07/2011  CHIEF COMPLAINT: Chest pain, syncope.   DISCHARGE DIAGNOSES:  1. Chest pain likely in the setting of cocaine abuse.  2. Polysubstance abuse.   DISCHARGE MEDICATIONS:  1. Wellbutrin XL 150 mg extended-release daily.  2. Multivitamin 1 tab daily.   DIET: Low sodium, ADA diet.   ACTIVITY: As tolerated.   FOLLOW-UP: Please follow-up with your PCP within 1 to 2 weeks.  DISCHARGE INSTRUCTIONS: Please stop using cocaine.   DISPOSITION: Home.   CODE STATUS: FULL CODE.   HISTORY OF PRESENT ILLNESS: For full details, please see history and physical dictated on 05/07/2011 by Dr. Margie EgePhichith. Briefly, this is a 49 year old gentleman with polysubstance abuse including cocaine, marijuana, and opiates who presented with chest pain associated with left arm numbness, nausea, vomiting, diaphoresis, and shortness of breath. He was admitted to the hospitalist service. Of note, the patient has had an admission in early January during which the patient underwent stress test which was negative and was told to follow-up with Dr. Juliann Paresallwood but he did not. He was admitted to the lower level care unit for observation and to rule out ACS.   SIGNIFICANT LABS AND IMAGING: Creatinine on arrival 0.92, sodium 144, potassium 4.3, glucose 98. Troponins were negative x3. TSH slightly elevated at 4.64. CK-MB negative x3. Urine toxicology is positive for benzodiazepines, cocaine, cannabinoids, as well as opiates. WBC 10.2 on arrival, hemoglobin 14.6, hematocrit 43. D-dimer was 0.31 essentially within normal limits. CT of the head without contrast did not show any acute intracranial process. EKG showing normal sinus rhythm, rate is 84, nonspecific T wave abnormalities without any ST elevations or depressions.   HOSPITAL COURSE: The patient was admitted for observation and to rule out acute  coronary syndrome. The patient underwent cyclic cardiac markers all of which have been negative. Of note, the patient has had a negative stress test recently. The patient's urine tox was sent which again is positive for multiple substances which he initially denied, however, relayed that he only used a little bit of cocaine which is not even in his system at higher doses probably by now. His blood pressure had been stable. He also complained of a syncopal episode and had a negative CT of the head. There is no significant arrhythmias on telemetry and his electrolytes were within normal limits mostly. While hospitalized he multiple times was asking for pain medications including Percocet for his chronic lower extremity pain. His LDL of note on last admission was 70. I discussed the case with Dr. Juliann Paresallwood. At this point, given that he has ruled out for acute coronary syndrome and has a negative D-dimer and CT of the head with positive urine tox, we would discharge the patient with outpatient follow-up. He has had negative troponins and has been essentially ruled out for acute coronary syndrome. He was counseled about his substance abuse. I do not think that he would stop his substance abuse including cocaine, however, I would think that that might be contributing in addition to some stress and anxiety to his overall condition. The cocaine can cause vasospastic pain and can cause cardiomyopathy and this was discussed with the patient thoroughly. At this point, the patient will be discharged with outpatient follow-up in addition to instructions to take a PPI over-the-counter for several weeks to see if this pain goes away as this is noncardiac currently.  DISPOSITION: Home.   TOTAL TIME SPENT: 35 minutes.   ____________________________ Krystal Eaton, MD sa:drc D: 05/07/2011 16:41:47 ET T: 05/08/2011 10:46:51 ET JOB#: [id number]  cc: Krystal Eaton, MD, <Dictator> Krystal Eaton MD ELECTRONICALLY SIGNED  05/17/2011 8:56

## 2014-07-09 NOTE — H&P (Signed)
PATIENT NAME:  [name] [name], [name] [name] MR#:  [id number] DATE OF BIRTH:  April 26, 1965  DATE OF ADMISSION:  03/19/2011  REFERRING PHYSICIAN:   Chiquita LothJade Sung, MD   PRIMARY CARE PHYSICIAN: None.   PRIMARY PSYCHIATRIST: Dr. Orson AloeHenderson  CHIEF COMPLAINT: Chest pain.   HISTORY OF PRESENT ILLNESS: The patient is a 49 year old male with a past medical history as listed below, including tobacco abuse, cocaine abuse, history of depression, panic disorder with agoraphobia, history of unspecified personality disorder and has had multiple psychiatric admissions, who presents to the Emergency Department with the above-mentioned chief complaint. The patient states that he developed left substernal chest pain over the past couple of days which has been described as a sharp and tight sensation, initially rated at 6 out of 10 in intensity, associated wit shortness of breath and with chest pain chest pain in the left substernal region radiating to left upper extremity associated with some left arm numbness as well. He took some aspirin at home which  helped with his chest pain and led to some improvement. Early this morning around 12:00 a.m., his chest pain returned and was worse. He was brought to the ER for further evaluation. EKG was obtained, and first EKG revealed sinus rhythm with nonspecific T wave changes. His first troponin was negative. He received a dose of IV Ativan, 100 mg of subcutaneous Lovenox, and also a nitro patch was applied to his chest and led to improvement of his chest pain currently to a level of 3 out of 10. However, since the chest pain persisted in the ER, a repeat EKG was obtained and revealed new T wave inversions in leads V3 to V5. Thereafter, Hospitalist Services were contacted for further evaluation. Otherwise, the patient is without specific complaints at this time.   PAST SURGICAL HISTORY: Right ankle fusion.   PAST MEDICAL HISTORY:  1. Depression.  2. Unspecified personality disorder.  3. Panic  disorder with agoraphobia.  4. Cocaine abuse.  5. Multiple inpatient psychiatric hospitalizations.  6. History of right ankle fusion.  7. Tobacco abuse, ongoing.   ALLERGIES: No known drug allergies.    DISCHARGE MEDICATIONS:  1. Wellbutrin 150 mg daily.  2. Percocet once or twice a day as needed.  3. Alprazolam 2 mg t.i.d.   FAMILY HISTORY: Mother deceased at age 47, history of unspecified heart disease, history of cerebrovascular accident, history of chronic obstructive pulmonary disease. His father is alive at age 47, history of coronary artery disease, status post coronary artery bypass graft.   SOCIAL HISTORY: Tobacco: Ongoing, in the past he smoked 3 packs per day, currently smoking 1 pack per day, has been smoking for 43 years. Alcohol: Occasional, and he last drank on New Year's Eve. Illicit drugs: He states he snorted cocaine on New Year's Eve. He denies IV drug use.  Urine drug screen was positive for cocaine, benzodiazepine and THC but not opiates.   REVIEW OF SYSTEMS: CONSTITUTIONAL: He denies fevers, chills, recent changes in weight or weakness. Had some chest pain. HEENT: Denies headache or blurry vision.  Denies tinnitus, earache, nasal discharge, or sore throat. RESPIRATORY: Reports shortness of breath. Denies cough, wheezing, or hemoptysis. CARDIOVASCULAR: Reports chest pain, denies heart palpitations or lower extremity edema. GASTROINTESTINAL: Denies nausea and vomiting, diarrhea, constipation, melena, hematochezia, abdominal pain. GENITOURINARY: Denies dysuria, hematuria. ENDOCRINE: Denies heat or cold intolerance. HEMATOLOGIC/LYMPHATIC: Denies any easy bruising or bleeding. INTEGUMENTARY: Denies rash. MUSCULOSKELETAL: Still has some right ankle pain after fusion. NEUROLOGIC: Denies any headache, tingling, weakness or  dysarthria. Has some numbness in his left arm associated with his chest pain. PSYCHIATRIC: Has underlying panic disorder and depression.   PHYSICAL EXAMINATION:   VITAL SIGNS: Temperature 97, pulse 82, blood pressure 110/68, respirations 18, oxygen saturation 96% on room air.   GENERAL: The patient is alert and oriented, not in acute distress.    HEENT: Normocephalic, atraumatic. Pupils are equal, round and reactive to light and accommodation.  Extraocular movements are intact. Anicteric sclerae. Conjunctivae pink. Hearing intact to voice. Nares without drainage. Oral mucous moist without lesion.   NECK: Supple with full range of motion. No JVD, lymphadenopathy, or carotid bruits bilaterally. No thyromegaly or tenderness to palpation over the thyroid gland.   CHEST: Normal respiratory effort without use of accessory respiratory muscles.   LUNGS: Clear to auscultation bilaterally without crackles, rales, or wheezes.   HEART: S1, S2 positive. Regular rate and rhythm. No murmurs, rubs, or gallops. PMI is non- lateralized. No tenderness to palpation over the anterior chest wall.   ABDOMEN: Soft, nontender, nondistended. Normoactive bowel sounds. No hepatosplenomegaly or palpable masses. No hernias.   EXTREMITIES: No clubbing, cyanosis, or edema. Pedal pulses are palpable bilaterally. The patient is with a right ankle brace.   SKIN: No suspicious rash.   LYMPH: No cervical lymphadenopathy.   NEUROLOGIC: Alert and oriented x3. Cranial nerves II through XII are grossly intact. No focal deficits.  PSYCHIATRIC: Somewhat anxious-appearing male.   LABORATORY, DIAGNOSTIC AND RADIOLOGICAL DATA:  Troponin less than 0.02.  Urine drug screen positive for cocaine, cannabinoids, and benzodiazepines.  Portable chest x-ray: No acute cardiopulmonary abnormalities are noted  CBC within normal limits. Complete metabolic panel: Sodium 146, potassium 3.6 chloride 109, bicarbonate 25, BUN 11, creatinine 1.02, glucose 88, calcium 8.4, anion gap 12.  First troponin was less than 0.02 with CK 74, CK-MB 0.8; second troponin was less than 0.02. Serum alcohol 0.079%.    ASSESSMENT AND PLAN: A 49 year old male with past medical history of depression, panic disorder, cocaine abuse, tobacco abuse, unspecified personality disorder, here with:   1. Chest pain with abnormal EKG: Likely due to cocaine abuse. The patient has risk factors for coronary artery disease including family history and tobacco abuse, in addition to cocaine abuse. Two troponins were negative in the ER and no evidence of myocardial infarction thus far. EKG shows new T wave inversions in V3 through V5. We will admit the patient to the Telemetry Unit. We will continue to cycle cardiac enzymes and rule out myocardial infarction. In the meanwhile, we will keep the patient on oxygen, aspirin, Lovenox, nitrate, and p.r.n. benzodiazepine therapy; also consider calcium channel blocker, as cocaine can lead to coronary vasospasm, if the chest pain does not improve with the above-mentioned measures. Otherwise, we will obtain Cardiology consultation for further recommendations.  2. Abnormal EKG in the setting of cocaine abuse as above: Cycle cardiac enzymes. Continue medications listed above, and obtain Cardiology consultation, and monitor the patient on telemetry.  3. Cocaine abuse: The patient was strongly counseled on the importance of remaining abstinent from cocaine, and we will obtain a Substance Abuse consult.  4. Tobacco abuse: The patient was strongly counseled on the importance of smoking cessation. Approximately five minutes were spent in doing so. We will place a nicotine patch per patient's desire.  5. Depression: Continue Wellbutrin and obtain Psychiatry evaluation.  6. Panic disorder: Continue alprazolam, obtain Psychiatry evaluation.  7. THC abuse: The patient was strongly counseled on the importance of remaining abstinent from  [facility] and  marijuana.  8. Deep vein thrombosis prophylaxis: Lovenox.   CODE STATUS:  FULL CODE.     TIME SPENT ON ADMISSION: Approximately 45 minutes.   ____________________________ Elon Alas, MD knl:cbb D: 03/19/2011 10:10:09 ET T: 03/19/2011 12:18:38 ET JOB#: [id number]  cc: Elon Alas, MD, <Dictator> Dr. Gaynelle Adu Ora Bollig MD ELECTRONICALLY SIGNED 04/03/2011 18:48

## 2014-07-12 ENCOUNTER — Ambulatory Visit: Payer: Self-pay | Admitting: Internal Medicine

## 2014-07-16 NOTE — Consult Note (Addendum)
PATIENT NAME:  [name] [name], [name] [name] MR#:  [id number] DATE OF BIRTH:  [birth year]  DATE OF CONSULTATION:  06/22/2014  REFERRING PHYSICIAN:   CONSULTING PHYSICIAN:  Audery AmelJohn T. Clenton Esper, MD  IDENTIFYING INFORMATION AND CHIEF COMPLAINT:   This is a 49 year old man with a history of substance abuse and bipolar disorder and attention deficit/hyperactivity disorder.   CHIEF COMPLAINT: "I ran out of my meds."   HISTORY OF PRESENT ILLNESS: Information from the patient and the chart. When  patient came in last night, he told the admitting nurse that he was feeling down and low and sad and was angry at himself.  Said that he felt stupid about having relapsed on cocaine. I do not see a clear documentation that he made suicidal statements although the commitment paperwork suggests that he said something suicidal. To my interview today, the patient is minimizing emotional problems. He says that for the last couple of weeks he has been feeling more moody and anxious. Sleep has been worse than usual. Appetite has not been as good. He blames all of this on being off of his Strattera. Evidently he had been taking Strattera prescribed by Dr. Omelia BlackwaterHeaden up until a couple of months ago, but at that point the samples ran out and the price was prohibitive. He is continuing to take his Xanax, which is his other psychiatric medicine. The patient admits that he relapsed on cocaine the day before yesterday. Snorted some because he said he got in "the wrong environment with the wrong people." Had not been planning to relapse. Denies having used any alcohol. Admits that he smokes marijuana on a daily basis and he sees that as being therapeutic for his ADHD. The patient does feel like he has chronic stress. Most [facility].  He worries about his work, worries about his relationship with his family. He denies suicidal thoughts.   PAST PSYCHIATRIC HISTORY:  The patient has a history of substance abuse problems  which were very heavy in the past. Had previous admissions here a few years ago. After that, he says that he managed to become sober from cocaine and has not used any cocaine in several years now. He continues to see Dr. Omelia BlackwaterHeaden for psychiatric treatment for a diagnosis of what he says is bipolar disorder and ADHD, although his only medicines are Xanax and Strattera. The patient claims that he does not abuse his Xanax, although the dose he quotes to me is actually higher than what he is prescribed. He denies ever actually trying to kill himself in the past. Denies any history of violence. Denies any history of psychosis.   SOCIAL HISTORY: Works as an Personnel officerelectrician. Lives with his long-term fiance and has a 110 year old daughter  who lives with him part time. Feels his social situation is pretty stable.   PAST MEDICAL HISTORY: Claims he was told he had recently had a stroke, although I am wondering if it was really a TIA given how it does not seem to have changed much in his presentation or treatment. Also, was told he had heart disease and was started on an aspirin. Does not know of ever having had a heart attack in the past. Denies any other medical problems, except for some arthritis. He did recently have an injury to his foot for which he says he is taking p.r.n. Percocet.   FAMILY HISTORY: He had a brother who had a severe alcohol problem and a sister also with an alcohol  problem.   SUBSTANCE ABUSE HISTORY: Does not have a history of abusing alcohol, but used to abuse cocaine heavily. Smokes marijuana ongoing. Says he has been sober for a few years. Still talks to a sponsor regularly. The patient smokes.   CURRENT MEDICATIONS: Xanax 2 mg b.i.d., aspirin 325 mg once a day, Percocet 10 mg p.r.n. daily, gabapentin 100 mg 3 times a day.   ALLERGIES:  No known drug allergies.   REVIEW OF SYSTEMS: Currently denies feeling depressed. Denies suicidal ideation. Denies any psychotic symptoms. Physically says he  has chronic back and leg pain and more acute pain in his right foot, but otherwise denies any physical symptoms.   MENTAL STATUS EXAMINATION: Neatly groomed gentleman who looks his stated age, cooperative with the interview. Eye contact good. Psychomotor activity normal. Speech normal rate, tone, and volume. Affect euthymic. Mood stated as good. Thoughts are lucid and reactive, although he tends to be overly elaborate in his story telling and a little circumstantial. No evidence of delusions. Denies auditory or visual hallucinations. Denies suicidal or homicidal ideation. He can repeat 3 words immediately, remembers 2 out of 3 at 3 minutes. He is alert and oriented x4. His baseline intelligence and fund of knowledge are normal.   LABORATORY RESULTS: His chemistry panel: Slightly high ALT at 77 and AST at 54. Alcohol level negative. Drug screen is positive for cocaine, marijuana, and benzodiazepines, all consistent with what he is reporting.  The CBC is entirely normal. Urinalysis unremarkable.   VITAL SIGNS: Blood pressure 123/82, respirations 18, pulse 95, temperature 98.   ASSESSMENT: This is a 49 year old man with a history of cocaine abuse and a diagnosis of bipolar disorder and attention deficit/hyperactivity disorder. Mood has been a little bit more  unstable and irritable recently. He blames it on his attention deficit/hyperactivity disorder. He totally denies having any suicidal thought, intent, or plan. Able to articulate several positive things in his life to live for. Affect seems upbeat. The patient acknowledges that the slip on the cocaine use was a problem and states that he plans to be more active and vigilant in avoiding drug use. At this point, he no longer meets any commitment criteria.   TREATMENT PLAN: The patient will be discharged from involuntary commitment and recommend discharge from the Emergency Room. Psychoeducation about substance abuse completed. Review medication. The patient  is aware that not everyone agrees with his continued dosing with Xanax, but Dr. Omelia Blackwater has been doing it for a long time and there is no point probably getting into this right now. The patient had asked whether we could "help him" with his Strattera. It is not clear what he even means by that since he says he already has a prescription for it and the only thing in his way is the financing of it. Encouraged him to continue follow-up outpatient care.   DIAGNOSIS, PRINCIPAL AND PRIMARY:  AXIS I: Depression secondary to cocaine abuse.   SECONDARY DIAGNOSES: AXIS I:     1.  Cocaine abuse.  2.  Bipolar disorder, depressed, in full remission.  3.  Attention deficit/hyperactivity disorder.  AXIS II:  Deferred.  AXIS III: No diagnosis.     ____________________________ Audery Amel, MD jtc:tr D: 06/22/2014 12:57:44 ET T: 06/22/2014 13:13:53 ET JOB#: [id number]  cc: Audery Amel, MD, <Dictator> Audery Amel MD ELECTRONICALLY SIGNED 07/21/2014 10:07

## 2014-07-24 ENCOUNTER — Encounter: Payer: Self-pay | Admitting: Internal Medicine

## 2014-07-24 ENCOUNTER — Ambulatory Visit: Payer: MEDICAID | Attending: Internal Medicine | Admitting: Internal Medicine

## 2014-07-24 VITALS — BP 126/78 | HR 100 | Temp 98.0°F | Resp 16 | Ht 72.0 in | Wt 198.0 lb

## 2014-07-24 DIAGNOSIS — M5410 Radiculopathy, site unspecified: Secondary | ICD-10-CM | POA: Insufficient documentation

## 2014-07-24 MED ORDER — TRAMADOL HCL 50 MG PO TABS: 50.0000 mg | ORAL_TABLET | Freq: Two times a day (BID) | ORAL | Status: DC

## 2014-07-24 NOTE — Progress Notes (Signed)
Pt is here following up on his pain in his right ankle and his lower back.

## 2014-07-24 NOTE — Progress Notes (Signed)
Patient ID: [name] [name], male   DOB: 09/04/1965, [age] y.o.   MRN: [id number]  CC:  HPI: [name] [name] is a 49 y.o. male here today for a follow up visit.  Patient has past medical history of MI at age 49, back pain, and arthritis. Today patient reports that he continues to have lower back pain with left-sided weakness and numbness. Pain is sharp and shoots down to bilateral feet. Aggravated by prolonged sitting, standing, walking. He reports intermittent left-sided numbness for several months. 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 As a result he has pins and screws holding his right foot together. He currently wears compression socks to assist with circulatory issues. Denies bowel or bladder dysfunction.  Patient has No headache, No chest pain, No abdominal pain - No Nausea, No Cough - SOB.  Allergies  Allergen Reactions  . Vicodin [Hydrocodone-Acetaminophen] Nausea And Vomiting   Past Medical History  Diagnosis Date  . Back pain   . MI (myocardial infarction)     at age 49  . Arthritis    Current Outpatient Prescriptions on File Prior to Visit  Medication Sig Dispense Refill  . ALPRAZolam (XANAX) 1 MG tablet Take 2 mg by mouth 4 (four) times daily. For anxiety    . aspirin 81 MG tablet Take 81 mg by mouth daily.    Marland Kitchen. atomoxetine (STRATTERA) 40 MG capsule Take 40 mg by mouth daily.    . cyclobenzaprine (FLEXERIL) 10 MG tablet Take 1 tablet (10 mg total) by mouth 3 (three) times daily as needed for muscle spasms. 60 tablet 3  . gabapentin (NEURONTIN) 300 MG capsule Take 1 capsule (300 mg total) by mouth 2 (two) times daily. 60 capsule 0  . Multiple Vitamin (MULITIVITAMIN WITH MINERALS) TABS Take 1 tablet by mouth daily.    Marland Kitchen. oxyCODONE-acetaminophen (PERCOCET) 10-325 MG per tablet Take 3 tablets by mouth at bedtime as needed for pain. (Patient not taking: Reported on 07/24/2014) 30 tablet 0   No current facility-administered medications on file prior  to visit.   Family History  Problem Relation Age of Onset  . Heart failure Mother   . Hypertension Mother   . Stroke Mother    History   Social History  . Marital Status: Single    Spouse Name: N/A  . Number of Children: N/A  . Years of Education: N/A   Occupational History  . Not on file.   Social History Main Topics  . Smoking status: Current Every Day Smoker    Types: Cigarettes  . Smokeless tobacco: Not on file  . Alcohol Use: No  . Drug Use: Not on file  . Sexual Activity: Not on file   Other Topics Concern  . Not on file   Social History Narrative    Review of Systems: See HPI   Objective:   Filed Vitals:   07/24/14 1702  BP: 126/78  Pulse: 100  Temp: 98 F (36.7 C)  Resp: 16   Physical exam Constitutional: He is oriented to person, place, and time.  Cardiovascular: Normal rate, regular rhythm and normal heart sounds.  Pulmonary/Chest: Effort normal and breath sounds normal.  Abdominal: Soft. Bowel sounds are normal.  Musculoskeletal: He exhibits tenderness (lumbar spine).  + straight leg raises bilaterally  Neurological: He is alert and oriented to person, place, and time. No cranial nerve deficit.  Very strong grip bilaterally, no weakness of upper or lower extremities  Skin: Skin is warm  and dry.   Lab Results  Component Value Date   WBC 10.1 06/21/2014   HGB 15.4 06/21/2014   HCT 46.8 06/21/2014   MCV 89 06/21/2014   PLT 239 06/21/2014   Lab Results  Component Value Date   CREATININE 0.94 06/21/2014   BUN 13 06/21/2014   NA 143 06/21/2014   K 3.6 06/21/2014   CL 108 06/21/2014   CO2 27 06/21/2014    Lab Results  Component Value Date   HGBA1C 5.4 06/14/2014   Lipid Panel     Component Value Date/Time   CHOL 123 06/29/2014 0935   TRIG 54 06/29/2014 0935   HDL 43 06/29/2014 0935   CHOLHDL 2.9 06/29/2014 0935   VLDL 11 06/29/2014 0935   LDLCALC 69 06/29/2014 0935       Assessment and plan:   [name] [name] was seen today  for follow-up.  Diagnoses and all orders for this visit:  Radicular low back pain Orders: -     traMADol (ULTRAM) 50 MG tablet; Take 1 tablet (50 mg total) by mouth 2 (two) times daily. Patient still has pending medicaid status. I have set him a appointment for orange card today. I have explained that this will be his last refill until he is able to see ortho.   Follow up as needed.      Holland CommonsKECK, VALERIE, NP-C [facility] [phone] 07/24/2014, 6:19 PM

## 2014-08-07 ENCOUNTER — Emergency Department
Admission: EM | Admit: 2014-08-07 | Discharge: 2014-08-08 | Disposition: A | Discharge status: Other Acute Inpatient Hospital | Payer: Self-pay | Attending: Emergency Medicine | Admitting: Emergency Medicine

## 2014-08-07 ENCOUNTER — Encounter: Payer: Self-pay | Admitting: Urgent Care

## 2014-08-07 DIAGNOSIS — Z720 Tobacco use: Secondary | ICD-10-CM | POA: Insufficient documentation

## 2014-08-07 DIAGNOSIS — F1210 Cannabis abuse, uncomplicated: Secondary | ICD-10-CM | POA: Insufficient documentation

## 2014-08-07 DIAGNOSIS — F312 Bipolar disorder, current episode manic severe with psychotic features: Secondary | ICD-10-CM | POA: Insufficient documentation

## 2014-08-07 DIAGNOSIS — F1410 Cocaine abuse, uncomplicated: Secondary | ICD-10-CM | POA: Insufficient documentation

## 2014-08-07 DIAGNOSIS — G4700 Insomnia, unspecified: Secondary | ICD-10-CM | POA: Insufficient documentation

## 2014-08-07 DIAGNOSIS — F419 Anxiety disorder, unspecified: Secondary | ICD-10-CM | POA: Insufficient documentation

## 2014-08-07 DIAGNOSIS — F1110 Opioid abuse, uncomplicated: Secondary | ICD-10-CM | POA: Insufficient documentation

## 2014-08-07 DIAGNOSIS — F1310 Sedative, hypnotic or anxiolytic abuse, uncomplicated: Secondary | ICD-10-CM | POA: Insufficient documentation

## 2014-08-07 DIAGNOSIS — F1510 Other stimulant abuse, uncomplicated: Secondary | ICD-10-CM | POA: Insufficient documentation

## 2014-08-07 DIAGNOSIS — M549 Dorsalgia, unspecified: Secondary | ICD-10-CM | POA: Insufficient documentation

## 2014-08-07 HISTORY — DX: Transient cerebral ischemic attack, unspecified: G45.9

## 2014-08-07 HISTORY — DX: Bipolar disorder, unspecified: F31.9

## 2014-08-07 HISTORY — DX: Other psychoactive substance abuse, uncomplicated: F19.10

## 2014-08-07 HISTORY — DX: Tobacco use: Z72.0

## 2014-08-07 LAB — URINALYSIS COMPLETE WITH MICROSCOPIC (ARMC ONLY)
Bacteria, UA: NONE SEEN
Bilirubin Urine: NEGATIVE
GLUCOSE, UA: NEGATIVE mg/dL
LEUKOCYTES UA: NEGATIVE
NITRITE: NEGATIVE
PH: 6 (ref 5.0–8.0)
Protein, ur: 30 mg/dL — AB
SPECIFIC GRAVITY, URINE: 1.026 (ref 1.005–1.030)

## 2014-08-07 LAB — URINE DRUG SCREEN, QUALITATIVE (ARMC ONLY)
Amphetamines, Ur Screen: POSITIVE — AB
Barbiturates, Ur Screen: NOT DETECTED
Benzodiazepine, Ur Scrn: POSITIVE — AB
CANNABINOID 50 NG, UR ~~LOC~~: POSITIVE — AB
Cocaine Metabolite,Ur ~~LOC~~: POSITIVE — AB
MDMA (ECSTASY) UR SCREEN: NOT DETECTED
Methadone Scn, Ur: NOT DETECTED
OPIATE, UR SCREEN: POSITIVE — AB
Phencyclidine (PCP) Ur S: NOT DETECTED
TRICYCLIC, UR SCREEN: NOT DETECTED

## 2014-08-07 LAB — CBC
HCT: 51.8 % (ref 40.0–52.0)
HEMOGLOBIN: 17.4 g/dL (ref 13.0–18.0)
MCH: 30.1 pg (ref 26.0–34.0)
MCHC: 33.7 g/dL (ref 32.0–36.0)
MCV: 89.3 fL (ref 80.0–100.0)
PLATELETS: 242 10*3/uL (ref 150–440)
RBC: 5.8 MIL/uL (ref 4.40–5.90)
RDW: 14.4 % (ref 11.5–14.5)
WBC: 10.8 10*3/uL — ABNORMAL HIGH (ref 3.8–10.6)

## 2014-08-07 LAB — COMPREHENSIVE METABOLIC PANEL
ALT: 16 U/L — ABNORMAL LOW (ref 17–63)
ANION GAP: 9 (ref 5–15)
AST: 22 U/L (ref 15–41)
Albumin: 5.2 g/dL — ABNORMAL HIGH (ref 3.5–5.0)
Alkaline Phosphatase: 104 U/L (ref 38–126)
BILIRUBIN TOTAL: 0.8 mg/dL (ref 0.3–1.2)
BUN: 8 mg/dL (ref 6–20)
CO2: 27 mmol/L (ref 22–32)
Calcium: 9.5 mg/dL (ref 8.9–10.3)
Chloride: 106 mmol/L (ref 101–111)
Creatinine, Ser: 1.04 mg/dL (ref 0.61–1.24)
GFR calc Af Amer: >60 mL/min (ref >60)
GFR calc non Af Amer: >60 mL/min (ref >60)
GLUCOSE: 109 mg/dL — AB (ref 65–99)
POTASSIUM: 3.5 mmol/L (ref 3.5–5.1)
SODIUM: 142 mmol/L (ref 135–145)
TOTAL PROTEIN: 8.6 g/dL — AB (ref 6.5–8.1)

## 2014-08-07 MED ORDER — OXYCODONE-ACETAMINOPHEN 5-325 MG PO TABS
ORAL_TABLET | ORAL | Status: AC
  Administered 2014-08-07: 2 via ORAL
  Filled 2014-08-07: qty 2

## 2014-08-07 MED ORDER — GABAPENTIN 300 MG PO CAPS
300.0000 mg | ORAL_CAPSULE | Freq: Two times a day (BID) | ORAL | Status: DC
  Administered 2014-08-07 – 2014-08-08 (×3): 300 mg via ORAL
  Filled 2014-08-07 (×2): qty 1

## 2014-08-07 MED ORDER — OLANZAPINE 10 MG PO TBDP: 10.0000 mg | ORAL_TABLET | Freq: Every day | ORAL | Status: DC

## 2014-08-07 MED ORDER — OLANZAPINE 10 MG PO TABS
10.0000 mg | ORAL_TABLET | Freq: Every day | ORAL | Status: DC
  Administered 2014-08-07 – 2014-08-08 (×2): 10 mg via ORAL
  Filled 2014-08-07: qty 1

## 2014-08-07 MED ORDER — OXYCODONE-ACETAMINOPHEN 5-325 MG PO TABS
2.0000 | ORAL_TABLET | Freq: Four times a day (QID) | ORAL | Status: DC | PRN
  Administered 2014-08-07 – 2014-08-08 (×3): 2 via ORAL
  Filled 2014-08-07 (×2): qty 2

## 2014-08-07 MED ORDER — ALPRAZOLAM 0.5 MG PO TABS
ORAL_TABLET | ORAL | Status: AC
  Administered 2014-08-07: 1 mg via ORAL
  Filled 2014-08-07: qty 2

## 2014-08-07 MED ORDER — ALPRAZOLAM 0.5 MG PO TABS
1.0000 mg | ORAL_TABLET | Freq: Four times a day (QID) | ORAL | Status: DC
  Administered 2014-08-07 – 2014-08-08 (×3): 1 mg via ORAL
  Filled 2014-08-07 (×2): qty 2

## 2014-08-07 MED ORDER — GABAPENTIN 300 MG PO CAPS
ORAL_CAPSULE | ORAL | Status: AC
  Administered 2014-08-07: 300 mg via ORAL
  Filled 2014-08-07: qty 1

## 2014-08-07 MED ORDER — OLANZAPINE 10 MG PO TABS
ORAL_TABLET | ORAL | Status: AC
  Administered 2014-08-07: 10 mg via ORAL
  Filled 2014-08-07: qty 1

## 2014-08-07 NOTE — ED Notes (Signed)
Patient presents for evaluation because he is off of his psychiatric medication. Wife reports that his rage is getting "out of control." Denies SI, however confirms that he would like to "harm people in a minute."

## 2014-08-07 NOTE — ED Provider Notes (Addendum)
[facility] Emergency Department Provider Note  ____________________________________________  Time seen: Approximately 10:10 PM  I have reviewed the triage vital signs and the nursing notes.   HISTORY  Chief Complaint Psychiatric Evaluation    HPI [name] [name] is a 49 y.o. male who reports that he has "severe" bipolar disorderand has had manic episodes in the past and currently feels that he has been manic for several days with difficulty sleeping.  He is also particularly concerned about his "rage episodes "and his feeling that he wants to harm other people.  He is also a polysubstance abuser who uses cocaine, methamphetamine, and marijuana, and has used all of them recently, within the last several days.  He and his family are both concerned that he is going to hurt someone because of his uncontrolled rage and his apparent mania.  He states that he has been taking his medications including his scheduled Xanax except for his Strattera which he has not had for several months.  He describes the overall situation as severe.  He has required hospitalization in the past.   Past Medical History  Diagnosis Date  . Back pain   . MI (myocardial infarction)     at age 77  . Arthritis   . Bipolar disorder   . TIA (transient ischemic attack)   . Tobacco abuse   . Polysubstance abuse     Patient Active Problem List   Diagnosis Date Noted  . ADHD (attention deficit hyperactivity disorder) 06/26/2014  . Anxiety 06/26/2014  . Acute chest pain   . Chest pain 06/14/2014  . Left-sided weakness 06/14/2014  . Chronic joint pain 06/14/2014  . Left arm weakness 06/14/2014    Past Surgical History  Procedure Laterality Date  . Foot surgery      Current Outpatient Rx  Name  Route  Sig  Dispense  Refill  . ALPRAZolam (XANAX) 1 MG tablet   Oral   Take 2 mg by mouth 4 (four) times daily. For anxiety         . aspirin 81 MG tablet   Oral   Take 81 mg by mouth  daily.         Marland Kitchen atomoxetine (STRATTERA) 40 MG capsule   Oral   Take 40 mg by mouth 2 (two) times daily.          Marland Kitchen gabapentin (NEURONTIN) 300 MG capsule   Oral   Take 1 capsule (300 mg total) by mouth 2 (two) times daily.   60 capsule   0   . Multiple Vitamin (MULITIVITAMIN WITH MINERALS) TABS   Oral   Take 1 tablet by mouth daily.         Marland Kitchen oxyCODONE-acetaminophen (PERCOCET) 10-325 MG per tablet   Oral   Take 3 tablets by mouth at bedtime as needed for pain. Patient taking differently: Take 1 tablet by mouth 2 (two) times daily. Once in the morning and once in the evening.   30 tablet   0   . cyclobenzaprine (FLEXERIL) 10 MG tablet   Oral   Take 1 tablet (10 mg total) by mouth 3 (three) times daily as needed for muscle spasms. Patient not taking: Reported on 08/07/2014   60 tablet   3   . traMADol (ULTRAM) 50 MG tablet   Oral   Take 1 tablet (50 mg total) by mouth 2 (two) times daily. Patient not taking: Reported on 08/07/2014   30 tablet   0  Allergies Vicodin  Family History  Problem Relation Age of Onset  . Heart failure Mother   . Hypertension Mother   . Stroke Mother     Social History History  Substance Use Topics  . Smoking status: Current Every Day Smoker    Types: Cigarettes  . Smokeless tobacco: Not on file  . Alcohol Use: No  Uses cocaine, meth, marijuana, opitates (prescribed), and benzos (prescribed)  Review of Systems Constitutional: No fever/chills Eyes: No visual changes. ENT: No sore throat. Cardiovascular: Denies chest pain. Respiratory: Denies shortness of breath. Gastrointestinal: No abdominal pain.  No nausea, no vomiting.  No diarrhea.  No constipation. Genitourinary: Negative for dysuria. Musculoskeletal: Negative for back pain. Skin: Negative for rash. Neurological: +headaches, no focal weakness nor numbness. Psychiatric:Patient reports he is not able to sleep for days, has been "running while, and is concerned  about the rage he feels and his wanting to hurt other people  10-point ROS otherwise negative.  ____________________________________________   PHYSICAL EXAM:  VITAL SIGNS: ED Triage Vitals  Enc Vitals Group     BP 08/07/14 2107 158/92 mmHg     Pulse Rate 08/07/14 2107 102     Resp 08/07/14 2107 20     Temp 08/07/14 2107 98.9 F (37.2 C)     Temp Source 08/07/14 2107 Oral     SpO2 08/07/14 2107 100 %     Weight --      Height --      Head Cir --      Peak Flow --      Pain Score 08/07/14 2053 10     Pain Loc --      Pain Edu? --      Excl. in GC? --     Constitutional: Alert and oriented. Well appearing and in no acute distress. Eyes: Conjunctivae are normal. PERRL. EOMI. Head: Atraumatic. Nose: No congestion/rhinnorhea. Mouth/Throat: Mucous membranes are moist.  Oropharynx non-erythematous. Neck: No stridor.   Cardiovascular: Normal rate, regular rhythm. Grossly normal heart sounds.  Good peripheral circulation. Respiratory: Normal respiratory effort.  No retractions. Lungs CTAB. Gastrointestinal: Soft and nontender. No distention. No abdominal bruits. No CVA tenderness. Musculoskeletal: No lower extremity tenderness nor edema.  No joint effusions. Neurologic:  Normal speech and language. No gross focal neurologic deficits are appreciated. Speech is normal. No gait instability. Skin:  Skin is warm, dry and intact. No rash noted. Psychiatric: Mood and affect are normal, and the patient is calm and cooperative at this time.  He continues to state how he is worried about his desire to hurt others and his "running wild" for days ____________________________________________   LABS (all labs ordered are listed, but only abnormal results are displayed)  Labs Reviewed  CBC - Abnormal; Notable for the following:    WBC 10.8 (*)    All other components within normal limits  COMPREHENSIVE METABOLIC PANEL - Abnormal; Notable for the following:    Glucose, Bld 109 (*)    Total  Protein 8.6 (*)    Albumin 5.2 (*)    ALT 16 (*)    All other components within normal limits  URINE DRUG SCREEN, QUALITATIVE ([facility]) - Abnormal; Notable for the following:    Amphetamines, Ur Screen POSITIVE (*)    Cocaine Metabolite,Ur [place] POSITIVE (*)    Opiate, Ur Screen POSITIVE (*)    Cannabinoid 50 Ng, Ur [place] POSITIVE (*)    Benzodiazepine, Ur Scrn POSITIVE (*)    All other components within normal  limits  URINALYSIS COMPLETEWITH MICROSCOPIC ([facility])  - Abnormal; Notable for the following:    Color, Urine AMBER (*)    APPearance CLEAR (*)    Ketones, ur 2+ (*)    Hgb urine dipstick 1+ (*)    Protein, ur 30 (*)    Squamous Epithelial / LPF 0-5 (*)    All other components within normal limits  ACETAMINOPHEN LEVEL  ETHANOL  SALICYLATE LEVEL   ____________________________________________  EKG  ED ECG REPORT I, Caliber Landess, the attending physician, personally viewed and interpreted this ECG.   Date: 08/08/2014  EKG Time: 00:05  Rate: 90  Rhythm: normal sinus rhythm  Axis: Normal  Intervals:none  ST&T Change: Nonspecific ST and T-wave changes, specifically inverted T-wave in lead 3 and V3.  However, minimal artifact present is also comp carrying the interpretation.  ____________________________________________  RADIOLOGY  No results found.  ____________________________________________  INITIAL IMPRESSION / ASSESSMENT AND PLAN / ED COURSE  Pertinent labs & imaging results that were available during my care of the patient were reviewed by me and considered in my medical decision making (see chart for details).  The patient is a polysubstance abuser also has a history of bipolar disorder, has not been taking all of his medications, and tells a compelling story about his concern for his inability to control himself and his desire to hurt others.  For his safety and that of those around him, I will IVC him until he can be evaluated by psychiatry.  He has no acute  medical issues to address at this time  I have reordered a subset of his medications which seem appropriate at this time, including regular Xanax, gabapentin, and Percocet.  I have also ordered Zyprexa 10 mg by mouth daily at bedtime which may help with his reported mania and rage.  ____________________________________________  FINAL CLINICAL IMPRESSION(S) / ED DIAGNOSES  Final diagnoses:  Bipolar affective disorder, current episode manic with psychotic symptoms    Loleta Roseory Isabellamarie Randa, MD 08/07/14 [id number]  Loleta Roseory Kaja Jackowski, MD 08/08/14 [id number]

## 2014-08-08 ENCOUNTER — Inpatient Hospital Stay
Admission: EM | Admit: 2014-08-08 | Discharge: 2014-08-10 | DRG: 897 | Disposition: A | Discharge status: Home / Self Care | Payer: Self-pay | Source: Intra-hospital | Attending: Psychiatry | Admitting: Psychiatry

## 2014-08-08 DIAGNOSIS — F3289 Other specified depressive episodes: Secondary | ICD-10-CM

## 2014-08-08 DIAGNOSIS — F410 Panic disorder [episodic paroxysmal anxiety] without agoraphobia: Secondary | ICD-10-CM | POA: Diagnosis present

## 2014-08-08 DIAGNOSIS — F319 Bipolar disorder, unspecified: Secondary | ICD-10-CM | POA: Diagnosis present

## 2014-08-08 DIAGNOSIS — F17200 Nicotine dependence, unspecified, uncomplicated: Secondary | ICD-10-CM | POA: Diagnosis present

## 2014-08-08 DIAGNOSIS — F602 Antisocial personality disorder: Secondary | ICD-10-CM | POA: Diagnosis present

## 2014-08-08 DIAGNOSIS — Z79899 Other long term (current) drug therapy: Secondary | ICD-10-CM

## 2014-08-08 DIAGNOSIS — Z560 Unemployment, unspecified: Secondary | ICD-10-CM | POA: Diagnosis present

## 2014-08-08 DIAGNOSIS — F1590 Other stimulant use, unspecified, uncomplicated: Secondary | ICD-10-CM | POA: Diagnosis present

## 2014-08-08 DIAGNOSIS — Z8673 Personal history of transient ischemic attack (TIA), and cerebral infarction without residual deficits: Secondary | ICD-10-CM

## 2014-08-08 DIAGNOSIS — F1120 Opioid dependence, uncomplicated: Secondary | ICD-10-CM | POA: Diagnosis present

## 2014-08-08 DIAGNOSIS — Z8249 Family history of ischemic heart disease and other diseases of the circulatory system: Secondary | ICD-10-CM

## 2014-08-08 DIAGNOSIS — Z79891 Long term (current) use of opiate analgesic: Secondary | ICD-10-CM

## 2014-08-08 DIAGNOSIS — G4700 Insomnia, unspecified: Secondary | ICD-10-CM | POA: Diagnosis present

## 2014-08-08 DIAGNOSIS — I252 Old myocardial infarction: Secondary | ICD-10-CM

## 2014-08-08 DIAGNOSIS — F1994 Other psychoactive substance use, unspecified with psychoactive substance-induced mood disorder: Principal | ICD-10-CM | POA: Diagnosis present

## 2014-08-08 DIAGNOSIS — Z823 Family history of stroke: Secondary | ICD-10-CM

## 2014-08-08 DIAGNOSIS — R531 Weakness: Secondary | ICD-10-CM | POA: Diagnosis present

## 2014-08-08 DIAGNOSIS — M2550 Pain in unspecified joint: Secondary | ICD-10-CM | POA: Diagnosis present

## 2014-08-08 DIAGNOSIS — Z9889 Other specified postprocedural states: Secondary | ICD-10-CM

## 2014-08-08 DIAGNOSIS — F19239 Other psychoactive substance dependence with withdrawal, unspecified: Secondary | ICD-10-CM | POA: Diagnosis present

## 2014-08-08 DIAGNOSIS — F13239 Sedative, hypnotic or anxiolytic dependence with withdrawal, unspecified: Secondary | ICD-10-CM | POA: Diagnosis present

## 2014-08-08 DIAGNOSIS — M1990 Unspecified osteoarthritis, unspecified site: Secondary | ICD-10-CM | POA: Diagnosis present

## 2014-08-08 DIAGNOSIS — Z7982 Long term (current) use of aspirin: Secondary | ICD-10-CM

## 2014-08-08 DIAGNOSIS — Z720 Tobacco use: Secondary | ICD-10-CM | POA: Diagnosis present

## 2014-08-08 DIAGNOSIS — G8929 Other chronic pain: Secondary | ICD-10-CM | POA: Diagnosis present

## 2014-08-08 DIAGNOSIS — F1220 Cannabis dependence, uncomplicated: Secondary | ICD-10-CM | POA: Diagnosis present

## 2014-08-08 DIAGNOSIS — F1924 Other psychoactive substance dependence with psychoactive substance-induced mood disorder: Secondary | ICD-10-CM

## 2014-08-08 DIAGNOSIS — F1520 Other stimulant dependence, uncomplicated: Secondary | ICD-10-CM | POA: Diagnosis present

## 2014-08-08 DIAGNOSIS — F329 Major depressive disorder, single episode, unspecified: Secondary | ICD-10-CM | POA: Diagnosis present

## 2014-08-08 DIAGNOSIS — Z885 Allergy status to narcotic agent status: Secondary | ICD-10-CM

## 2014-08-08 DIAGNOSIS — Z811 Family history of alcohol abuse and dependence: Secondary | ICD-10-CM

## 2014-08-08 LAB — SALICYLATE LEVEL: Salicylate Lvl: <4 mg/dL (ref 2.8–30.0)

## 2014-08-08 LAB — ACETAMINOPHEN LEVEL

## 2014-08-08 LAB — ETHANOL

## 2014-08-08 MED ORDER — ALPRAZOLAM 1 MG PO TABS
2.0000 mg | ORAL_TABLET | Freq: Four times a day (QID) | ORAL | Status: DC
  Administered 2014-08-09 (×2): 2 mg via ORAL
  Filled 2014-08-08 (×2): qty 2

## 2014-08-08 MED ORDER — NICOTINE 10 MG IN INHA
1.0000 | RESPIRATORY_TRACT | Status: DC | PRN
  Administered 2014-08-08: 1 via RESPIRATORY_TRACT

## 2014-08-08 MED ORDER — MAGNESIUM HYDROXIDE 400 MG/5ML PO SUSP
30.0000 mL | Freq: Every day | ORAL | Status: DC | PRN
Start: 2014-08-08 — End: 2014-08-10

## 2014-08-08 MED ORDER — QUETIAPINE FUMARATE 25 MG PO TABS
100.0000 mg | ORAL_TABLET | Freq: Once | ORAL | Status: AC
  Administered 2014-08-08: 100 mg via ORAL

## 2014-08-08 MED ORDER — QUETIAPINE FUMARATE 200 MG PO TABS
ORAL_TABLET | ORAL | Status: AC
  Filled 2014-08-08: qty 1

## 2014-08-08 MED ORDER — ALPRAZOLAM 0.5 MG PO TABS
2.0000 mg | ORAL_TABLET | Freq: Four times a day (QID) | ORAL | Status: DC
  Administered 2014-08-08: 2 mg via ORAL

## 2014-08-08 MED ORDER — ACETAMINOPHEN 325 MG PO TABS: 650.0000 mg | ORAL_TABLET | Freq: Four times a day (QID) | ORAL | Status: DC | PRN

## 2014-08-08 MED ORDER — NICOTINE 10 MG IN INHA
RESPIRATORY_TRACT | Status: AC
  Filled 2014-08-08: qty 36

## 2014-08-08 MED ORDER — ALPRAZOLAM 0.5 MG PO TABS
ORAL_TABLET | ORAL | Status: AC
  Filled 2014-08-08: qty 2

## 2014-08-08 MED ORDER — GABAPENTIN 300 MG PO CAPS
300.0000 mg | ORAL_CAPSULE | Freq: Two times a day (BID) | ORAL | Status: DC
  Filled 2014-08-08: qty 1

## 2014-08-08 MED ORDER — OXYCODONE-ACETAMINOPHEN 5-325 MG PO TABS
2.0000 | ORAL_TABLET | Freq: Four times a day (QID) | ORAL | Status: DC | PRN
  Administered 2014-08-09: 2 via ORAL
  Filled 2014-08-08: qty 2

## 2014-08-08 MED ORDER — ALPRAZOLAM 0.5 MG PO TABS
1.0000 mg | ORAL_TABLET | Freq: Once | ORAL | Status: AC
  Administered 2014-08-08: 1 mg via ORAL

## 2014-08-08 MED ORDER — OLANZAPINE 10 MG PO TABS: 10.0000 mg | ORAL_TABLET | Freq: Every day | ORAL | Status: DC

## 2014-08-08 MED ORDER — NICOTINE 10 MG IN INHA: 1.0000 | RESPIRATORY_TRACT | Status: DC | PRN

## 2014-08-08 MED ORDER — ALPRAZOLAM 0.5 MG PO TABS
ORAL_TABLET | ORAL | Status: AC
  Filled 2014-08-08: qty 4

## 2014-08-08 MED ORDER — ALUM & MAG HYDROXIDE-SIMETH 200-200-20 MG/5ML PO SUSP: 30.0000 mL | ORAL | Status: DC | PRN

## 2014-08-08 NOTE — ED Provider Notes (Signed)
-----------------------------------------   7:25 AM on 08/08/2014 -----------------------------------------   BP 158/92 mmHg  Pulse 102  Temp(Src) 98.9 F (37.2 C) (Oral)  Resp 20  SpO2 100%  The patient had no acute events since last update.  Calm and cooperative at this time.  Disposition is pending per Psychiatry/Behavioral Medicine team recommendations.     Rebecka ApleyAllison P Maddux Vanscyoc, MD 08/08/14 [phone]

## 2014-08-08 NOTE — ED Notes (Signed)
BEHAVIORAL HEALTH ROUNDING Patient sleeping: No. Patient alert and oriented: yes Behavior appropriate: Yes.  ; If no, describe:  Nutrition and fluids offered: Yes  Toileting and hygiene offered: Yes  Sitter present: yes Law enforcement present: Yes  

## 2014-08-08 NOTE — ED Notes (Signed)
BEHAVIORAL HEALTH ROUNDING Patient sleeping: No. Patient alert and oriented: yes Behavior appropriate: No.; If no, describe: Patient irritated and reporting increased anxiety, reporting medications are not right and requesting to take shower. Informed patient that this nurse would make MD aware of medication concerns. Supplies given for shower.  Nutrition and fluids offered: Yes  Toileting and hygiene offered: Yes  Sitter present: yes Law enforcement present: Yes

## 2014-08-08 NOTE — ED Notes (Signed)
Pt brought into ED BHU via sally port and wanded with metal detector for safety by [employer] officer. Patient oriented to unit/care area: Pt informed of unit policies and procedures.  Informed that, for their safety, care areas are designed for safety and monitored by security cameras at all times; and visiting hours explained to patient. Patient verbalizes understanding, and verbal contract for safety obtained.Pt shown to their room.  

## 2014-08-08 NOTE — BH Assessment (Signed)
Assessment Note  [name] CaddyRobert K [name] is an 49 y.o. male. [name] [name] reported the ED with reports that he is not doing better at this time.  He states that he is feeling depressed and is facing a lot of stress.  He denied having auditory or visual hallucinations.  He denied suicidal ideation or intent. He reports having a difficult time after cleaning out the house of his deceased mother to sell the home.  He expressed that the memories were overwhelming for him.  He further states that he had to bury his sister recently,  and he has not been able to see his daughter in a year.  He further reports I cant get along with no one. He shared that he has a difficult time with crowds. He reports a history of drug usage. He reports recent use of marijuana and cocaine to the TTS.  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 Axis II: Deferred Axis III:  Past Medical History  Diagnosis Date   Back pain    MI (myocardial infarction)     at age 89   Arthritis    Bipolar disorder    TIA (transient ischemic attack)    Tobacco abuse    Polysubstance abuse    Axis IV: economic problems and other psychosocial or environmental problems Axis V: 51-60 moderate symptoms  Past Medical History:  Past Medical History  Diagnosis Date   Back pain    MI (myocardial infarction)     at age 49   Arthritis    Bipolar disorder    TIA (transient ischemic attack)    Tobacco abuse    Polysubstance abuse     Past Surgical History  Procedure Laterality Date   Foot surgery      Family History:  Family History  Problem Relation Age of Onset   Heart failure Mother    Hypertension Mother    Stroke Mother     Social History:  reports that he has been smoking Cigarettes.  He does not have any smokeless tobacco history on file. He reports that he uses illicit drugs (Cocaine, Marijuana, and Methamphetamines). He reports that he does not drink alcohol.  Additional Social History:  Alcohol /  Drug Use History of alcohol / drug use?: Yes Negative Consequences of Use: Financial, Personal relationships Withdrawal Symptoms: Irritability Substance #1 Name of Substance 1: Marijuana 1 - Age of First Use: 16 1 - Amount (size/oz): 3 blunts 1 - Frequency: Daily 1 - Last Use / Amount: 08/05/2014 Substance #2 Name of Substance 2: Cocaine 2 - Age of First Use: 42 2 - Amount (size/oz): Unsure 2 - Frequency: Rarely 2 - Last Use / Amount: 06/05/2014  CIWA: CIWA-Ar BP: (!) 158/92 mmHg Pulse Rate: (!) 102 COWS:    Allergies:  Allergies  Allergen Reactions   Vicodin [Hydrocodone-Acetaminophen] Nausea And Vomiting    Home Medications:  (Not in a hospital admission)  OB/GYN Status:  No LMP for male patient.  General Assessment Data [facility] Assessment: In system Is this a Tele or Face-to-Face Assessment?: Face-to-Face Is this an Initial Assessment or a Re-assessment for this encounter?: Initial Assessment Marital status: Divorced Living Arrangements: Alone Can pt return to current living arrangement?: Yes Admission Status: Voluntary Referral Source: MD Insurance type: Self Pay     Crisis Care Plan Living Arrangements: Alone Name of Psychiatrist: Darlyn ChamberKenneth Headden Name of Therapist: None  Education Status Is patient currently in school?: No  Risk to  self with the past 6 months Suicidal Ideation: No Has patient been a risk to self within the past 6 months prior to admission? : No Suicidal Intent: No Has patient had any suicidal intent within the past 6 months prior to admission? : Other (comment) Is patient at risk for suicide?: No Suicidal Plan?: No Has patient had any suicidal plan within the past 6 months prior to admission? : No What has been your use of drugs/alcohol within the last 12 months?: Marijuana and Cocaine Previous Attempts/Gestures: No How many times?: 0 Intentional Self Injurious Behavior: None Family Suicide History:  No Recent stressful life event(s):  (Death of family members) Persecutory voices/beliefs?: No Depression: Yes Depression Symptoms: Despondent Substance abuse history and/or treatment for substance abuse?: Yes Suicide prevention information given to non-admitted patients: Not applicable  Risk to Others within the past 6 months Homicidal Ideation: No Does patient have any lifetime risk of violence toward others beyond the six months prior to admission? : Unknown Thoughts of Harm to Others: No Current Homicidal Intent: No Current Homicidal Plan: No Access to Homicidal Means: No History of harm to others?: Yes Violent Behavior Description:  (Aggressive behaviors  (over 57 assault charges against him)) Does patient have access to weapons?: No Criminal Charges Pending?: No Does patient have a court date: No Is patient on probation?: No  Psychosis Hallucinations: None noted Delusions: None noted  Mental Status Report Appearance/Hygiene: In scrubs Eye Contact: Poor Motor Activity: Unremarkable Speech: Unremarkable Level of Consciousness: Quiet/awake Mood: Preoccupied Affect: Sullen Anxiety Level: Minimal Thought Processes: Circumstantial Judgement: Unable to Assess Orientation: Time                      Abuse/Neglect Assessment (Assessment to be complete while patient is alone) Physical Abuse: Denies Verbal Abuse: Denies Sexual Abuse: Denies Exploitation of patient/patient's resources: Denies Self-Neglect: Denies                Disposition:  Disposition Initial Assessment Completed for this Encounter: Yes Disposition of Patient: Referred to (To be seen by the psychiatrist)  On Site Evaluation by:   Reviewed with Physician:    Theadora Rama 08/08/2014 6:01 AM

## 2014-08-08 NOTE — ED Notes (Signed)
BEHAVIORAL HEALTH ROUNDING Patient sleeping: Yes.   Patient alert and oriented: not applicable Behavior appropriate: Yes.  ; If no, describe:  Nutrition and fluids offered: No Toileting and hygiene offered: No Sitter present: yes Law enforcement present: Yes   
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
## 2014-08-08 NOTE — ED Notes (Signed)

## 2014-08-08 NOTE — ED Notes (Signed)
BEHAVIORAL HEALTH ROUNDING Patient sleeping: No. Patient alert and oriented: yes Behavior appropriate: No.; If no, describe:  Medications given as ordered however patient continues to be irritated. At this time he is staying in his room.  Nutrition and fluids offered: Yes  Toileting and hygiene offered: Yes  Sitter present: yes Law enforcement present: Yes

## 2014-08-08 NOTE — ED Notes (Signed)
BEHAVIORAL HEALTH ROUNDING Patient sleeping: No. Pt going back to sleep after receiving 0600 dose of Xanax 1 mg Patient alert and oriented: yes Behavior appropriate: Yes.   Nutrition and fluids offered: Yes  Toileting and hygiene offered: Yes  Sitter present: q15 min observations and security camera monitoring Law enforcement present: Yes [employer]

## 2014-08-08 NOTE — ED Notes (Signed)
Pt transferred to ED BHU accompanied by CNA and [employer] officer. Report given to Desert AireBeth, Charity fundraiserN.

## 2014-08-08 NOTE — ED Notes (Signed)
BEHAVIORAL HEALTH ROUNDING Patient sleeping: No. Patient alert and oriented: yes Behavior appropriate: Yes.  ; If no, describe:   Nutrition and fluids offered: Yes  Toileting and hygiene offered: Yes  Sitter present: no Law enforcement present: Yes  and [employer]  

## 2014-08-08 NOTE — ED Notes (Signed)
BEHAVIORAL HEALTH ROUNDING Patient sleeping: Yes.   Patient alert and oriented: not applicable Behavior appropriate: Yes.    Nutrition and fluids offered: No Toileting and hygiene offered: No Sitter present: q15 minute observations and security camera monitoring Law enforcement present: Yes [employer] 

## 2014-08-08 NOTE — ED Notes (Signed)
BEHAVIORAL HEALTH ROUNDING Patient sleeping: No. Patient alert and oriented: yes Behavior appropriate: Yes.   Nutrition and fluids offered: Yes  Toileting and hygiene offered: Yes  Sitter present: q15 min observations and security camera monitoring Law enforcement present: Yes [employer] 

## 2014-08-08 NOTE — ED Notes (Signed)
Patient assigned to appropriate care area. Patient oriented to unit/care area: Informed that, for their safety, care areas are designed for safety and monitored by security cameras at all times; and visiting hours explained to patient. Patient verbalizes understanding, and verbal contract for safety obtained. 

## 2014-08-08 NOTE — ED Notes (Addendum)
BEHAVIORAL HEALTH ROUNDING Patient sleeping: No. Patient alert and oriented: yes Behavior appropriate: Yes.  ; If no, describe:  Nutrition and fluids offered: Yes  Toileting and hygiene offered: Yes  Sitter present: yes Law enforcement present: Yes  

## 2014-08-08 NOTE — ED Notes (Signed)
PT  IVC  /  SEEN  BY  DR  CLAPACS ON  08/08/14  PT  GOING  TO  [facility]  MED  TONIGHT

## 2014-08-08 NOTE — ED Notes (Signed)
BEHAVIORAL HEALTH ROUNDING Patient sleeping: Yes.   Patient alert and oriented: yes Behavior appropriate: Yes.  ; If no, describe:  Nutrition and fluids offered: Yes  Toileting and hygiene offered: Yes  Sitter present: yes Law enforcement present: Yes     ENVIRONMENTAL ASSESSMENT Potentially harmful objects out of patient reach: Yes.   Personal belongings secured: Yes.   Patient dressed in hospital provided attire only: Yes.   Plastic bags out of patient reach: Yes.   Patient care equipment (cords, cables, call bells, lines, and drains) shortened, removed, or accounted for: Yes.   Equipment and supplies removed from bottom of stretcher: Yes.   Potentially toxic materials out of patient reach: Yes.   Sharps container removed or out of patient reach: Yes.   

## 2014-08-09 DIAGNOSIS — F17200 Nicotine dependence, unspecified, uncomplicated: Secondary | ICD-10-CM | POA: Diagnosis present

## 2014-08-09 DIAGNOSIS — F1590 Other stimulant use, unspecified, uncomplicated: Secondary | ICD-10-CM | POA: Diagnosis present

## 2014-08-09 DIAGNOSIS — Z720 Tobacco use: Secondary | ICD-10-CM | POA: Diagnosis present

## 2014-08-09 DIAGNOSIS — F1520 Other stimulant dependence, uncomplicated: Secondary | ICD-10-CM | POA: Diagnosis present

## 2014-08-09 DIAGNOSIS — F602 Antisocial personality disorder: Secondary | ICD-10-CM | POA: Diagnosis present

## 2014-08-09 DIAGNOSIS — F1220 Cannabis dependence, uncomplicated: Secondary | ICD-10-CM | POA: Diagnosis present

## 2014-08-09 DIAGNOSIS — F1120 Opioid dependence, uncomplicated: Secondary | ICD-10-CM | POA: Diagnosis present

## 2014-08-09 MED ORDER — GABAPENTIN 400 MG PO CAPS
400.0000 mg | ORAL_CAPSULE | Freq: Three times a day (TID) | ORAL | Status: DC
  Administered 2014-08-09 – 2014-08-10 (×4): 400 mg via ORAL
  Filled 2014-08-09 (×3): qty 1

## 2014-08-09 MED ORDER — HALOPERIDOL 5 MG PO TABS
5.0000 mg | ORAL_TABLET | Freq: Three times a day (TID) | ORAL | Status: DC | PRN
  Administered 2014-08-10: 5 mg via ORAL
  Filled 2014-08-09: qty 1

## 2014-08-09 MED ORDER — GABAPENTIN 400 MG PO CAPS: 400.0000 mg | ORAL_CAPSULE | Freq: Three times a day (TID) | ORAL | Status: DC

## 2014-08-09 MED ORDER — TRAZODONE HCL 100 MG PO TABS: 100.0000 mg | ORAL_TABLET | Freq: Every evening | ORAL | Status: DC | PRN

## 2014-08-09 MED ORDER — DIPHENHYDRAMINE HCL 25 MG PO CAPS
50.0000 mg | ORAL_CAPSULE | Freq: Three times a day (TID) | ORAL | Status: DC | PRN
  Administered 2014-08-10: 50 mg via ORAL
  Filled 2014-08-09: qty 2

## 2014-08-09 MED ORDER — NICOTINE 21 MG/24HR TD PT24
21.0000 mg | MEDICATED_PATCH | Freq: Every day | TRANSDERMAL | Status: DC
  Administered 2014-08-09 – 2014-08-10 (×2): 21 mg via TRANSDERMAL
  Filled 2014-08-09 (×2): qty 1

## 2014-08-09 MED ORDER — CHLORDIAZEPOXIDE HCL 25 MG PO CAPS
50.0000 mg | ORAL_CAPSULE | Freq: Three times a day (TID) | ORAL | Status: DC
  Administered 2014-08-09 – 2014-08-10 (×3): 50 mg via ORAL
  Filled 2014-08-09 (×4): qty 2

## 2014-08-09 MED ORDER — GABAPENTIN 400 MG PO CAPS
400.0000 mg | ORAL_CAPSULE | Freq: Three times a day (TID) | ORAL | Status: DC
  Filled 2014-08-09: qty 1

## 2014-08-09 MED ORDER — GABAPENTIN 800 MG PO TABS
400.0000 mg | ORAL_TABLET | Freq: Three times a day (TID) | ORAL | Status: DC
  Filled 2014-08-09 (×3): qty 0.5

## 2014-08-09 MED ORDER — MELOXICAM 7.5 MG PO TABS
15.0000 mg | ORAL_TABLET | Freq: Every day | ORAL | Status: DC
  Administered 2014-08-09 – 2014-08-10 (×2): 15 mg via ORAL
  Filled 2014-08-09 (×2): qty 2

## 2014-08-09 MED ORDER — LORAZEPAM 2 MG PO TABS: 2.0000 mg | ORAL_TABLET | Freq: Three times a day (TID) | ORAL | Status: DC | PRN
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
## 2014-08-09 NOTE — Tx Team (Signed)
Initial Interdisciplinary Treatment Plan   PATIENT STRESSORS: Health problems Medication change or noncompliance   PATIENT STRENGTHS: Ability for insight Average or above average intelligence Supportive family/friends   PROBLEM LIST: Problem List/Patient Goals Date to be addressed Date deferred Reason deferred Estimated date of resolution  Depression 08/09/14     Poly Subtance 08/09/14                                                DISCHARGE CRITERIA:  Ability to meet basic life and health needs Motivation to continue treatment in a less acute level of care  PRELIMINARY DISCHARGE PLAN:   PATIENT/FAMIILY INVOLVEMENT: This treatment plan has been presented to and reviewed with the patient, [name] [name], and/or family member, .  The patient and family have been given the opportunity to ask questions and make suggestions.  [name] [name] [name] 08/09/2014, 6:41 PM

## 2014-08-09 NOTE — H&P (Addendum)
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 agitation: Patient became very agitated and demanded discharge once he learned he was not  going to be prescribed with Xanax or OxyContin. Nurses report that in a prior admission he was likely pretending to have a seizure I was not able to find this documentation in the chart. I ordered Haldol 5 mg every 8 hours when necessary and Benadryl 50 mg every 8 hours when necessary in case of agitation   Tobacco use disorder: I will restart the patient on a nicotine patch 21 mg  Hospitalization status continue voluntary admission  Precautions continue every 15 minute checks  This assessment took longer than 90 minutes. More than 50% of the time was spent in coordination of care. Contacting Dr. he didn't, reviewing the [place] controlled substance that others. Reviewing medical records from [facility] and review of medical records from [facility].  Medical  Decision Making:  Established Problem, Worsening (2)  I certify that inpatient services furnished can reasonably be expected to improve the patient's condition.   Hildred Priest 5/25/201611:58 AM

## 2014-08-09 NOTE — [facility] Group Notes (Signed)
[facility]  08/09/2014 10:53 AM  Participation Quality:  Appropriate  Affect:  Appropriate  Cognitive:  Appropriate  Insight:  Engaged  Engagement in Group:  Developing/Improving  Modes of Intervention:  Discussion and Support  Summary of Progress/Problems: patients goal to meet with doctor and social worker  Arrie SenateBandi, [name] M 08/09/2014, 10:53 AM

## 2014-08-09 NOTE — [facility] Suicide Risk Assessment (Signed)
[facility]   Nursing information obtained from:    Demographic factors:    Current Mental Status:    Loss Factors:    Historical Factors:    Risk Reduction Factors:    Total Time spent with patient: 1 hour Principal Problem: Substance or medication-induced depressive disorder with onset during intoxication Diagnosis:   Patient Active Problem List   Diagnosis Date Noted  . 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 Cannabis use disorder, severe, dependence [F12.20] 08/09/2014  . Opioid use disorder, severe, dependence [F11.20] 08/09/2014  . Stimulant use disorder (cocaine use disorder) [F15.99] 08/09/2014  . Tobacco use disorder [Z72.0] 08/09/2014  . Antisocial personality disorder [F60.2] 08/09/2014  . 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 Left-sided weakness [M62.89] 06/14/2014  . Chronic joint pain [M25.50, G89.29] 06/14/2014     Continued Clinical Symptoms:  Alcohol Use Disorder Identification Test Final Score (AUDIT): 0 The "Alcohol Use Disorders Identification Test", Guidelines for Use in Primary Care, Second Edition.  World Science writerHealth [employer] [facility](WHO). Score between 0-7:  no or low risk or alcohol related problems. Score between 8-15:  moderate risk of alcohol related problems. Score between 16-19:  high risk of alcohol related problems. Score 20 or above:  warrants further diagnostic evaluation for alcohol dependence and treatment.   CLINICAL FACTORS:   Severe Anxiety and/or Agitation Alcohol/Substance Abuse/Dependencies Personality Disorders:   Cluster B Comorbid alcohol abuse/dependence Previous Psychiatric Diagnoses and Treatments Medical Diagnoses and Treatments/Surgeries   Musculoskeletal: Strength & Muscle Tone: within normal limits and Mild weakness on the left side secondary to a mini stroke Gait & Station: normal Patient leans: N/A  Psychiatric  Specialty Exam: Physical Exam  Review of Systems  Constitutional: Negative.   HENT: Negative.   Eyes: Negative.   Respiratory: Negative.   Cardiovascular: Negative.   Gastrointestinal: Negative.   Genitourinary: Negative.   Musculoskeletal: Positive for back pain and joint pain.  Skin: Negative.   Neurological: Negative.   Endo/Heme/Allergies: Negative.   Psychiatric/Behavioral: Positive for depression and substance abuse. Negative for suicidal ideas and hallucinations. The patient is nervous/anxious and has insomnia.     Blood pressure 97/66, pulse 83, temperature 98.2 F (36.8 C), temperature source Oral, resp. rate 20, height 6\' 2"  (1.88 m), weight 97.523 kg (215 lb).Body mass index is 27.59 kg/(m^2).   COGNITIVE FEATURES THAT CONTRIBUTE TO RISK:  Closed-mindedness    SUICIDE RISK:   Mild:  Suicidal ideation of limited frequency, intensity, duration, and specificity.  There are no identifiable plans, no associated intent, mild dysphoria and related symptoms, good self-control (both objective and subjective assessment), few other risk factors, and identifiable protective factors, including available and accessible social support.  [facility] C[facility]  Medical Decision Making:  Established Problem, Worsening (2)  I certify that inpatient services furnished can reasonably be expected to improve the patient's condition.   Jimmy FootmanHernandez-Gonzalez,  Zuriyah Shatz 08/09/2014, 11:17 AM

## 2014-08-09 NOTE — Plan of Care (Signed)
Problem: Ineffective individual coping Goal: STG-Increase in ability to manage activities of daily living Outcome: Not Progressing Unable to show any insight on behavior

## 2014-08-09 NOTE — B[facility] Group Notes (Signed)
[facility] Group Notes:  (Nursing/MHT/Case Management/Adjunct)  Date:  08/09/2014  Time:  10:58 PM  Type of Therapy:  Group Therapy  Participation Level:  Active  Participation Quality:  Appropriate  Affect:  Appropriate  Cognitive:  Appropriate  Insight:  Appropriate  Engagement in Group:  Off Topic  Modes of Intervention:  Support  Summary of Progress/Problems:  Murrell ReddenOlivia Briana Morgan-Little 08/09/2014, 10:58 PM

## 2014-08-09 NOTE — Progress Notes (Signed)
   08/09/14 1000  Clinical Encounter Type  Visited With Patient  Visit Type Spiritual support  Referral From Nurse;Physician  Consult/Referral To Chaplain  Spiritual Encounters  Spiritual Needs Prayer  Stress Factors  Patient Stress Factors Major life changes  Family Stress Factors Major life changes  Advance Directives (For Healthcare)  Does patient have an advance directive? No   Chaplain provided therapeutic presence, empathic listening and emotional support.  AD [phone]

## 2014-08-09 NOTE — Progress Notes (Signed)
Patient angry about not receiving medication that he prefers . Voice of wanting to leave. Stated he was not satisfied with his care . Patient stated he called Dr. Omelia BlackwaterHeaden today. Voice of leaving tomorrow . Limited interaction with his peers and staff . Appetite fair . Patient  Noted more lethargic at 1600 time for medication librium held .

## 2014-08-09 NOTE — Progress Notes (Signed)
Recreation Therapy Notes  INPATIENT RECREATION THERAPY ASSESSMENT  Patient Details Name: [name] CaddyRobert K Moccio MRN: [id number] DOB: Aug 29, 1965 Today's Date: 08/09/2014  Patient Stressors: Death, Other (Comment) (Psychiatrist here)  Coping Skills:   Isolate, Arguments, Substance Abuse, Avoidance, Exercise, Music  Personal Challenges: Anger, Communication, Concentration, Decision-Making, Expressing Yourself, Problem-Solving, Stress Management, Substance Abuse, Trusting Others  Leisure Interests (2+):  [employer]oco[employer] - Fishing, Technical brewerature - Other (Comment) (Camping)  Awareness of Community Resources:  Yes  Community Resources:  Park  Current Use: Yes  If no, Barriers?:    Patient Strengths:  Pretty good person, kind to others  Patient Identified Areas of Improvement:  Communiction with people, making better decisions  Current Recreation Participation[place]  Patient Goal for Hospitalization:  To keep from cussing out the psychiatrist  [place]  [place]idence[place]   Current SI (including self-harm):  No  Current HI:  No  Consent to Intern Participation: N/A   Jacquelynn CreeGreene,Alfard Cochrane M, LRT/CTRS 08/09/2014, 2:23 PM

## 2014-08-09 NOTE — Progress Notes (Signed)
Recreation Therapy Notes  Date: 05.25.16 Time: 3:00 pm Location: Craft Room  Group Topic: Self-esteem  Goal Area(s) Addresses:  Patient will be able to identify benefit of self-esteem. Patient will be able to identify ways to increase self-esteem.  Behavioral Response: Did not attend  Intervention: Self-Portrait  Activity: Patients were instructed to draw their self-portrait. Afterwards, patients were instructed to write something positive about themselves and about their peers. Patients drew their self-portrait again after they read what their peers wrote about them.  Education:LRT educated patients on self-esteem and different ways to increase their self-esteem   Education Outcome: Patient did not attend group.  Clinical Observations/Feedback: Patient did not attend group.   Jacquelynn CreeGreene,Frederick Marro M, LRT/CTRS 08/09/2014 4:12 PM

## 2014-08-09 NOTE — Progress Notes (Signed)
Initial Nutrition Assessment  DOCUMENTATION CODES:     INTERVENTION:   (Meals and Snacks: Cater to patient preferences)  NUTRITION DIAGNOSIS:  Inadequate oral intake related unknown etiology as evidenced by 10.4% weight loss x 5 months.    GOAL:  Patient will meet greater than or equal to 90% of their needs    MONITOR:  PO intake  REASON FOR ASSESSMENT:  Malnutrition Screening Tool    ASSESSMENT:  Reason For Admission: Bipolar disorder PMHx: Past Medical History  Diagnosis Date  . Back pain   . MI (myocardial infarction)     at age 49  . Arthritis   . Bipolar disorder   . TIA (transient ischemic attack)   . Tobacco abuse   . Polysubstance abuse     Typical Fluid/ Food Intake: 100% intake recorded per I/O this AM Meal/ Snack Patterns: Unable to assess Supplements:None  Labs:  Electrolyte and Renal Profile:    Recent Labs Lab 08/07/14 2056  BUN 8  CREATININE 1.04  NA 142  K 3.5   Protein Profile:   Recent Labs Lab 08/07/14 2056  ALBUMIN 5.2*    Meds: reviewed  Physical Findings: n/a Weight Changes: Weight history noted below. Current weight represents a 10.4% weight loss x 5 months- significant.    Height:  Ht Readings from Last 1 Encounters:  08/08/14 6\' 2"  (1.88 m)    Weight:  Wt Readings from Last 1 Encounters:  08/08/14 215 lb (97.523 kg)    Ideal Body Weight:     Wt Readings from Last 10 Encounters:  08/08/14 215 lb (97.523 kg)  07/24/14 198 lb (89.812 kg)  06/26/14 198 lb (89.812 kg)  06/14/14 200 lb 2 oz (90.776 kg)  03/05/14 240 lb (108.863 kg)    BMI:  Body mass index is 27.59 kg/(m^2).  Skin:  Reviewed, no issues  Diet Order:  Diet regular Room service appropriate?: No; Fluid consistency:: Thin  EDUCATION NEEDS:  No education needs identified at this time   Intake/Output Summary (Last 24 hours) at 08/09/14 1504 Last data filed at 08/09/14 1347  Gross per 24 hour  Intake    600 ml  Output      0  ml  Net    600 ml    Last BM:  5/23  Joeseph Amorracey L. Gaines, RDN Pager: [phone] Office: [phone]  LOW Care Level

## 2014-08-10 DIAGNOSIS — F1924 Other psychoactive substance dependence with psychoactive substance-induced mood disorder: Secondary | ICD-10-CM

## 2014-08-10 DIAGNOSIS — F3289 Other specified depressive episodes: Secondary | ICD-10-CM

## 2014-08-10 DIAGNOSIS — F19239 Other psychoactive substance dependence with withdrawal, unspecified: Secondary | ICD-10-CM | POA: Diagnosis present

## 2014-08-10 MED ORDER — CHLORDIAZEPOXIDE HCL 25 MG PO CAPS: 25.0000 mg | ORAL_CAPSULE | Freq: Four times a day (QID) | ORAL | Status: DC

## 2014-08-10 MED ORDER — MELOXICAM 15 MG PO TABS: 15.0000 mg | ORAL_TABLET | Freq: Every day | ORAL | Status: DC

## 2014-08-10 MED ORDER — GABAPENTIN 400 MG PO CAPS: 400.0000 mg | ORAL_CAPSULE | Freq: Three times a day (TID) | ORAL | Status: DC

## 2014-08-10 MED ORDER — CHLORDIAZEPOXIDE HCL 25 MG PO CAPS: 25.0000 mg | ORAL_CAPSULE | Freq: Three times a day (TID) | ORAL | Status: DC

## 2014-08-10 MED ORDER — NICOTINE 21 MG/24HR TD PT24: 21.0000 mg | MEDICATED_PATCH | Freq: Every day | TRANSDERMAL | Status: DC

## 2014-08-10 NOTE — Progress Notes (Signed)
AVS H&P Discharge Summary faxed to [facility])

## 2014-08-10 NOTE — Plan of Care (Signed)
Problem: [facility] Participation in Recreation Therapeutic Interventions Goal: STG-Patient will identify at least five coping skills for ** STG: Coping Skills - Within 3 treatment sessions, patient will verbalize at least 5 coping skills for anger in one treatment session to increase anger management skills post d/c.  Outcome: Completed/Met Date Met:  08/10/14 Treatment Session 1; Completed 1 out of 1: At approximately 8:55 am, LRT met with patient in craft room. Patient verbalized 5 coping skills for anger. Patient verbalized what triggers him to get angry, and how his body responds to anger. LRT provided suggestions on ways patient can remember to use his healthy coping skills. 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  Leonette Monarch, LRT/CTRS 05.26.16 11:51 am Goal: STG-Other Recreation Therapy Goal (Specify) STG: Stress Management - Within 2 treatment sessions, patient will verbalize understanding of stress management techniques in one treatment session to increase stress management skills post d/c.  Outcome: Completed/Met Date Met:  08/10/14 Treatment Session 1; Completed 1 out of 1: At approximately 8:55 am, LRT met with patient in craft room. LRT educated and provided patient with handouts on stress management techniques. Patient verbalized understanding. LRT encouraged patient to read over and practice the stress management techniques.  Leonette Monarch, LRT/CTRS 05.26.16 11:52 am

## 2014-08-10 NOTE — B[facility] Suicide Risk Assessment (Signed)
[facility]   Demographic Factors:  Male, Caucasian and Unemployed  Total Time spent with patient: 30 minutes    Psychiatric Specialty Exam: Physical Exam  ROS  Blood pressure 125/90, pulse 84, temperature 98.2 F (36.8 C), temperature source Oral, resp. rate 20, height 6\' 2"  (1.88 m), weight 97.523 kg (215 lb).Body mass index is 27.59 kg/(m^2).  Have you used any form of tobacco in the last 30 days? (Cigarettes, Smokeless Tobacco, Cigars, and/or Pipes): Yes  Has this patient used any form of tobacco in the last 30 days? (Cigarettes, Smokeless Tobacco, Cigars, and/or Pipes) Yes, A prescription for an FDA-approved tobacco cessation medication was offered at discharge and the patient refused  Mental Status Per Nursing Assessment::   On Admission:     Current Mental Status by Physician: Denies suicidality denies homicidality denies auditory or visual hallucinations, no evidence of withdrawal from benzodiazepine  Loss Factors: Financial problems/change in socioeconomic status  Historical Factors: Impulsivity  Risk Reduction Factors:   Positive social support  Continued Clinical Symptoms:  Alcohol/Substance Abuse/Dependencies Personality Disorders:   Cluster B Comorbid alcohol abuse/dependence  Cognitive Features That Contribute To Risk:  None    Suicide Risk:  Minimal: No identifiable suicidal ideation.  Patients presenting with no risk factors but with morbid ruminations; may be classified as minimal risk based on the severity of the depressive symptoms  Principal Problem: Substance or medication-induced depressive disorder with onset during intoxication Discharge Diagnoses:  Patient Active Problem List   Diagnosis Date Noted  . 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 Cannabis use disorder, severe, dependence [F12.20] 08/09/2014  . Opioid use disorder, severe, dependence [F11.20] 08/09/2014  . Stimulant use disorder (cocaine  use disorder) [F15.99] 08/09/2014  . Tobacco use disorder [Z72.0] 08/09/2014  . Antisocial personality disorder [F60.2] 08/09/2014  . 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 Left-sided weakness [M62.89] 06/14/2014  . Chronic joint pain [M25.50, G89.29] 06/14/2014    Follow-up Information    Follow up with Dr Kristin [facility]insHeaden [facility]. Go in 1 week.   Why:  Hospital Discharge Follow up   Contact information:   [address] [place] [address] [phone]      Plan Of Care/Follow-up recommendations:  Other:  f/u with Dr. Omelia BlackwaterHeaden  Is patient on multiple antipsychotic therapies at discharge:  No   Has Patient had three or more failed trials of antipsychotic monotherapy by history:  No  Recommended Plan for Multiple Antipsychotic Therapies: NA    Jimmy FootmanHernandez-Gonzalez,  [name] Jaskulski 08/10/2014, 9:34 AM

## 2014-08-10 NOTE — Progress Notes (Signed)
D: Patient denies SI/HI/AVH. Patient affect is and mood are anxious.  Patient mood is labile.  Patient did attend evening group. Patient visible on the milieu. No distress noted. A: Support and encouragement offered. Scheduled medications given to pt. Q 15 min checks continued for patient safety. R: Patient receptive. Patient remains safe on the unit.
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
## 2014-08-10 NOTE — Progress Notes (Signed)
Patient discharged home with girlfriend. Instructions reviewed. Patient verbalized understanding of meds and follow up care. Denies SI/HI. Belongings returned. Patient provided seven-day supply of meds. Prescriptions faxed to his pharmacy by physician.

## 2014-08-10 NOTE — Progress Notes (Signed)
Recreation Therapy Notes  INPATIENT RECREATION TR PLAN  Patient Details Name: [name] [name] MRN: [id number] DOB: 01/18/1966 Today's Date: 08/10/2014  Rec Therapy Plan Is patient appropriate for Therapeutic Recreation?: Yes Treatment times per week: At least once a week TR Treatment/Interventions: 1:1 session, Group participation (Comment) (Appropriate participation in daily recreation therapy tx)  Discharge Criteria Pt will be discharged from therapy if:: Discharged Treatment plan/goals/alternatives discussed and agreed upon by:: Patient/family  Discharge Summary Short term goals set: See Care Plan Short term goals met: Complete Progress toward goals comments: One-to-one attended One-to-one attended: Stress management and coping skills Reason goals not met: N/A Therapeutic equipment acquired: None Reason patient discharged from therapy: Discharge from hospital Pt/family agrees with progress & goals achieved: Yes Date patient discharged from therapy: 08/10/14   Leonette Monarch, LRT/CTRS 08/10/2014, 11:53 AM

## 2014-08-18 ENCOUNTER — Ambulatory Visit: Payer: Self-pay

## 2014-08-23 ENCOUNTER — Encounter (HOSPITAL_COMMUNITY): Payer: Self-pay | Admitting: *Deleted

## 2014-08-23 ENCOUNTER — Emergency Department (HOSPITAL_COMMUNITY)
Admission: EM | Admit: 2014-08-23 | Discharge: 2014-08-23 | Disposition: A | Discharge status: Home / Self Care | Payer: Self-pay | Attending: Emergency Medicine | Admitting: Emergency Medicine

## 2014-08-23 DIAGNOSIS — Z8673 Personal history of transient ischemic attack (TIA), and cerebral infarction without residual deficits: Secondary | ICD-10-CM | POA: Insufficient documentation

## 2014-08-23 DIAGNOSIS — K088 Other specified disorders of teeth and supporting structures: Secondary | ICD-10-CM | POA: Insufficient documentation

## 2014-08-23 DIAGNOSIS — I252 Old myocardial infarction: Secondary | ICD-10-CM | POA: Insufficient documentation

## 2014-08-23 DIAGNOSIS — M1990 Unspecified osteoarthritis, unspecified site: Secondary | ICD-10-CM | POA: Insufficient documentation

## 2014-08-23 DIAGNOSIS — Z79899 Other long term (current) drug therapy: Secondary | ICD-10-CM | POA: Insufficient documentation

## 2014-08-23 DIAGNOSIS — K0889 Other specified disorders of teeth and supporting structures: Secondary | ICD-10-CM

## 2014-08-23 DIAGNOSIS — F319 Bipolar disorder, unspecified: Secondary | ICD-10-CM | POA: Insufficient documentation

## 2014-08-23 DIAGNOSIS — Z720 Tobacco use: Secondary | ICD-10-CM | POA: Insufficient documentation

## 2014-08-23 MED ORDER — OXYCODONE-ACETAMINOPHEN 5-325 MG PO TABS
1.0000 | ORAL_TABLET | Freq: Once | ORAL | Status: AC
  Administered 2014-08-23: 1 via ORAL
  Filled 2014-08-23: qty 1

## 2014-08-23 MED ORDER — PENICILLIN V POTASSIUM 500 MG PO TABS: 500.0000 mg | ORAL_TABLET | Freq: Four times a day (QID) | ORAL | Status: AC

## 2014-08-23 NOTE — ED Notes (Signed)
Pt requesting pain medicine.  PA notified. 

## 2014-08-23 NOTE — ED Notes (Signed)
C/O LEFT UPPER DENTAL PAIN, STATES IS SEVERE. HAS APPOINTMENT WITH A DENTIST ON Friday.

## 2014-08-23 NOTE — Discharge Instructions (Signed)
Return to the emergency room with worsening of symptoms, new symptoms or with symptoms that are concerning, especially fevers, chest pain, shortness of breath, unable to open mouth fully, drooling, facial or tongue swelling. Please take all of your antibiotics until finished!   You may develop abdominal discomfort or diarrhea from the antibiotic.  You may help offset this with probiotics which you can buy or get in yogurt. Do not eat  or take the probiotics until 2 hours after your antibiotic.  Ibuprofen 400mg  (2 tablets 200mg ) every 5-6 hours for 3-5 days. Follow up with dentist as scheduled in 2 days. Read below information and follow recommendations.  Dental Pain A tooth ache may be caused by cavities (tooth decay). Cavities expose the nerve of the tooth to air and hot or cold temperatures. It may come from an infection or abscess (also called a boil or furuncle) around your tooth. It is also often caused by dental caries (tooth decay). This causes the pain you are having. DIAGNOSIS  Your caregiver can diagnose this problem by exam. TREATMENT   If caused by an infection, it may be treated with medications which kill germs (antibiotics) and pain medications as prescribed by your caregiver. Take medications as directed.  Only take over-the-counter or prescription medicines for pain, discomfort, or fever as directed by your caregiver.  Whether the tooth ache today is caused by infection or dental disease, you should see your dentist as soon as possible for further care. SEEK MEDICAL CARE IF: The exam and treatment you received today has been provided on an emergency basis only. This is not a substitute for complete medical or dental care. If your problem worsens or new problems (symptoms) appear, and you are unable to meet with your dentist, call or return to this location. SEEK IMMEDIATE MEDICAL CARE IF:   You have a fever.  You develop redness and swelling of your face, jaw, or neck.  You  are unable to open your mouth.  You have severe pain uncontrolled by pain medicine. MAKE SURE YOU:   Understand these instructions.  Will watch your condition.  Will get help right away if you are not doing well or get worse. Document Released: 03/03/2005 Document Revised: 05/26/2011 Document Reviewed: 10/20/2007 [facility] Patient Information 2015 [employer] [place]. This information is not intended to replace advice given to you by your health care provider. Make sure you discuss any questions you have with your health care provider.

## 2014-08-23 NOTE — ED Provider Notes (Signed)
CSN: [id number]     Arrival date & time 08/23/14  1258 History  This chart was scribed for non-physician practitioner, Oswaldo ConroyVictoria Adalay Azucena, PA-C working with Samuel JesterKathleen McManus, DO by Placido SouLogan Joldersma, ED scribe. This patient was seen in room TR05C/TR05C and the patient's care was started at 2:14 PM.    Chief Complaint  Patient presents with  . Dental Pain    The history is provided by the patient. No language interpreter was used.    HPI Comments: [name] CaddyRobert K Finnie is a 49 y.o. male who presents to the Emergency Department complaining of worsening, moderate mouth pain with onset 5 days ago. He notes symptoms worsen when speaking, swallowing, eating and drinking. Pt has been taking Tylenol and Midol with no relief of symptoms. He additionally notes having a dental appointment this Friday. He denies any history of DM. Pt denies fever, chills, CP, SOB, facial swelling, and tongue swelling.  Past Medical History  Diagnosis Date  . Back pain   . MI (myocardial infarction)     at age 49  . Arthritis   . Bipolar disorder   . TIA (transient ischemic attack)   . Tobacco abuse   . Polysubstance abuse    Past Surgical History  Procedure Laterality Date  . Foot surgery     Family History  Problem Relation Age of Onset  . Heart failure Mother   . Hypertension Mother   . Stroke Mother    History  Substance Use Topics  . Smoking status: Current Every Day Smoker    Types: Cigarettes  . Smokeless tobacco: Not on file  . Alcohol Use: No    Review of Systems  Constitutional: Negative for fever and chills.  HENT: Positive for dental problem.   Respiratory: Negative for chest tightness and shortness of breath.   Cardiovascular: Negative for chest pain and palpitations.      Allergies  Vicodin  Home Medications   Prior to Admission medications   Medication Sig Start Date End Date Taking? Authorizing Provider  chlordiazePOXIDE (LIBRIUM) 25 MG capsule Take 1 capsule (25 mg total) by mouth 4  (four) times daily. 08/10/14   Jimmy FootmanAndrea Hernandez-Gonzalez, MD  chlordiazePOXIDE (LIBRIUM) 25 MG capsule Take 1 capsule (25 mg total) by mouth 3 (three) times daily. 08/10/14   Jimmy FootmanAndrea Hernandez-Gonzalez, MD  gabapentin (NEURONTIN) 400 MG capsule Take 1 capsule (400 mg total) by mouth 3 (three) times daily. 08/10/14   Jimmy FootmanAndrea Hernandez-Gonzalez, MD  meloxicam (MOBIC) 15 MG tablet Take 1 tablet (15 mg total) by mouth daily. 08/10/14   Jimmy FootmanAndrea Hernandez-Gonzalez, MD  nicotine (NICODERM CQ - DOSED IN MG/24 HOURS) 21 mg/24hr patch Place 1 patch (21 mg total) onto the skin daily. 08/10/14   Jimmy FootmanAndrea Hernandez-Gonzalez, MD  penicillin v potassium (VEETID) 500 MG tablet Take 1 tablet (500 mg total) by mouth 4 (four) times daily. 08/23/14 08/30/14  Oswaldo ConroyVictoria Shakeem Stern, PA-C   BP 125/79 mmHg  Pulse 85  Temp(Src) 98 F (36.7 C) (Oral)  Resp 18  Ht 6\' 2"  (1.88 m)  Wt 220 lb (99.791 kg)  BMI 28.23 kg/m2  SpO2 98% Physical Exam  Constitutional: He appears well-developed and well-nourished. No distress.  HENT:  Head: Normocephalic and atraumatic.  Mouth/Throat: Oropharynx is clear and moist. No oropharyngeal exudate.  No trismus or uvula deviation No lip, tongue, facial swelling. No swelling under tongue or tenderness. Patient handling secretions. No drooling. Patient with poor dentition. Patient with tenderness to left and right upper back molar with mild erythema in  this gingiva but no drainable abscess.   Eyes: Conjunctivae are normal. Right eye exhibits no discharge. Left eye exhibits no discharge.  Neck: Normal range of motion. Neck supple.  No neck masses or tenderness.  Cardiovascular: Normal rate and regular rhythm.   Pulmonary/Chest: Effort normal and breath sounds normal. No respiratory distress. He has no wheezes.  Abdominal: Soft. He exhibits no distension. There is no tenderness.  Lymphadenopathy:    He has no cervical adenopathy.  Neurological: He is alert. Coordination normal.  Skin: Skin is warm  and dry. He is not diaphoretic.  Nursing note and vitals reviewed.   ED Course  Procedures  DIAGNOSTIC STUDIES: Oxygen Saturation is 97% on RA, normal by my interpretation.    COORDINATION OF CARE: 2:17 PM Discussed treatment plan with pt at bedside including pain medication and pt agreed to plan.  Labs Review Labs Reviewed - No data to display  Imaging Review No results found.   EKG Interpretation None      MDM   Final diagnoses:  Pain, dental   Patient with toothache.  No gross abscess.  No history of diabetes. Exam unconcerning for Ludwig's angina or spread of infection.  Will treat with penicillin and NSAIDS.  Pain managed in ED. Urged patient to follow-up with dentist with appointment in 2 days.    Discussed return precautions with patient. Discussed all results and patient verbalizes understanding and agrees with plan.  I personally performed the services described in this documentation, which was scribed in my presence. The recorded information has been reviewed and is accurate.   Oswaldo Conroy, PA-C 08/23/14 2044  Samuel Jester, DO 08/24/14 1556

## 2014-08-23 NOTE — ED Notes (Signed)
Pt reports severe left upper dental pain, has appt on Friday to have tooth removed. Airway intact.

## 2014-09-06 DIAGNOSIS — F419 Anxiety disorder, unspecified: Secondary | ICD-10-CM | POA: Insufficient documentation

## 2014-09-06 DIAGNOSIS — F988 Other specified behavioral and emotional disorders with onset usually occurring in childhood and adolescence: Secondary | ICD-10-CM | POA: Insufficient documentation

## 2014-09-17 ENCOUNTER — Emergency Department (HOSPITAL_COMMUNITY)
Admission: EM | Admit: 2014-09-17 | Discharge status: Absent without leave | Payer: MEDICAID | Source: Home / Self Care | Attending: Emergency Medicine | Admitting: Emergency Medicine

## 2014-09-17 ENCOUNTER — Encounter (HOSPITAL_COMMUNITY): Payer: Self-pay

## 2014-09-17 NOTE — ED Notes (Signed)
Bed: [id number] Expected date:  Expected time:  Means of arrival:  Comments: [age] M rectal bleed/psych

## 2014-09-17 NOTE — ED Notes (Signed)
Pt c/o rectal bleeding x 3031yrs.  No change in symptoms recently.

## 2015-06-16 DIAGNOSIS — F3170 Bipolar disorder, currently in remission, most recent episode unspecified: Secondary | ICD-10-CM | POA: Insufficient documentation

## 2016-10-13 ENCOUNTER — Emergency Department
Admission: EM | Admit: 2016-10-13 | Discharge: 2016-10-13 | Discharge status: Against Medical Advice | Payer: Self-pay | Attending: Emergency Medicine | Admitting: Emergency Medicine

## 2016-10-13 ENCOUNTER — Encounter: Payer: Self-pay | Admitting: Emergency Medicine

## 2016-10-13 DIAGNOSIS — F17210 Nicotine dependence, cigarettes, uncomplicated: Secondary | ICD-10-CM | POA: Insufficient documentation

## 2016-10-13 DIAGNOSIS — Z950 Presence of cardiac pacemaker: Secondary | ICD-10-CM | POA: Insufficient documentation

## 2016-10-13 DIAGNOSIS — I252 Old myocardial infarction: Secondary | ICD-10-CM | POA: Insufficient documentation

## 2016-10-13 DIAGNOSIS — Z8673 Personal history of transient ischemic attack (TIA), and cerebral infarction without residual deficits: Secondary | ICD-10-CM | POA: Insufficient documentation

## 2016-10-13 DIAGNOSIS — F319 Bipolar disorder, unspecified: Secondary | ICD-10-CM | POA: Insufficient documentation

## 2016-10-13 DIAGNOSIS — F1110 Opioid abuse, uncomplicated: Secondary | ICD-10-CM | POA: Insufficient documentation

## 2016-10-13 DIAGNOSIS — F1990 Other psychoactive substance use, unspecified, uncomplicated: Secondary | ICD-10-CM | POA: Insufficient documentation

## 2016-10-13 DIAGNOSIS — F1210 Cannabis abuse, uncomplicated: Secondary | ICD-10-CM | POA: Insufficient documentation

## 2016-10-13 NOTE — ED Triage Notes (Signed)
Pt presents to ED from home ambulatory c/o heroin addiction. Last use was 5 hours ago. Pt is requesting some additional services for help. Denies SI, HI. Pt states he "is sick and tired of this".

## 2016-10-13 NOTE — ED Notes (Addendum)
Pt states that he is angry that he had to wait for 2 hrs "when [he] is trying to get help". Pt states, "I'm trying to detox from heroin, the greatest thing on the planet but they are sending me to [facility] which I can literally walk across the stress and 'cop' it, that's where I buy it but apparently since I don't have insurance they can't help me". Pt then goes on to state that because he doesn't have any insurance he isn't getting any help. Pt then gets up and is visualized walking out of the ED without difficulty. MD notified pt eloped.

## 2016-10-13 NOTE — ED Provider Notes (Signed)
[facility] Emergency Department Provider Note  Time seen: 7:48 PM  I have reviewed the triage vital signs and the nursing notes.   HISTORY  Chief Complaint Drug / Alcohol Assessment    HPI [name] [name] is a 51 y.o. male with a past medical history of bipolar, anxiety, substance abuse, presents to the emergency department for heroin detox. According to the patient he has been using heroin (snorting) for the past 9 months on a daily basis. His last use was approximate 4-5 hours ago. Patient denies alcohol use. Denies any other drugs.Patient states he has lost his house, his job due to heroin use. He wants to get clean. No medical complaints today. No SI or HI.  Past Medical History:  Diagnosis Date  . Arthritis   . Back pain   . Bipolar disorder (HCC)   . MI (myocardial infarction) (HCC)    at age 51  . Polysubstance abuse   . TIA (transient ischemic attack)   . Tobacco abuse     Patient Active Problem List   Diagnosis Date Noted  . 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 Cannabis use disorder, severe, dependence (HCC) 08/09/2014  . Opioid use disorder, severe, dependence (HCC) 08/09/2014  . Stimulant use disorder (cocaine use disorder) 08/09/2014  . Tobacco use disorder 08/09/2014  . Antisocial personality disorder 08/09/2014  . Left-sided weakness 06/14/2014  . Chronic joint pain 06/14/2014    Past Surgical History:  Procedure Laterality Date  . FOOT SURGERY    . PACEMAKER PLACEMENT      Prior to Admission medications   Medication Sig Start Date End Date Taking? Authorizing Provider  chlordiazePOXIDE (LIBRIUM) 25 MG capsule Take 1 capsule (25 mg total) by mouth 4 (four) times daily. 08/10/14   Jimmy FootmanHernandez-Gonzalez, Andrea, MD  chlordiazePOXIDE (LIBRIUM) 25 MG capsule Take 1 capsule (25 mg total) by mouth 3 (three) times daily.  08/10/14   Jimmy FootmanHernandez-Gonzalez, Andrea, MD  gabapentin (NEURONTIN) 400 MG capsule Take 1 capsule (400 mg total) by mouth 3 (three) times daily. 08/10/14   Jimmy FootmanHernandez-Gonzalez, Andrea, MD  meloxicam (MOBIC) 15 MG tablet Take 1 tablet (15 mg total) by mouth daily. 08/10/14   Jimmy FootmanHernandez-Gonzalez, Andrea, MD  nicotine (NICODERM CQ - DOSED IN MG/24 HOURS) 21 mg/24hr patch Place 1 patch (21 mg total) onto the skin daily. 08/10/14   Jimmy FootmanHernandez-Gonzalez, Andrea, MD    Allergies  Allergen Reactions  . Vicodin [Hydrocodone-Acetaminophen] Nausea And Vomiting    Family History  Problem Relation Age of Onset  . Heart failure Mother   . Hypertension Mother   . Stroke Mother     Social History Social History  Substance Use Topics  . Smoking status: Current Every Day Smoker    Types: Cigarettes  . Smokeless tobacco: Not on file  . Alcohol use No    Review of Systems Constitutional: Negative for fever. Cardiovascular: Negative for chest pain. Respiratory: Negative for shortness of breath. Gastrointestinal: Negative for abdominal pain Neurological: Negative for headache All other ROS negative  ____________________________________________   PHYSICAL EXAM:  VITAL SIGNS: ED Triage Vitals  Enc Vitals Group     BP 10/13/16 1906 (!) 159/88     Pulse Rate 10/13/16 1906 79     Resp 10/13/16 1906 19     Temp 10/13/16 1906 99 F (37.2 C)     Temp Source 10/13/16 1906 Oral     SpO2 10/13/16  1906 100 %     Weight 10/13/16 1907 195 lb (88.5 kg)     Height 10/13/16 1907 5\' 9"  (1.753 m)     Head Circumference --      Peak Flow --      Pain Score --      Pain Loc --      Pain Edu? --      Excl. in GC? --     Constitutional: Alert and oriented. Well appearing and in no distress. Eyes: Normal exam ENT   Head: Normocephalic and atraumatic.   Mouth/Throat: Mucous membranes are moist. Cardiovascular: Normal rate, regular rhythm. No murmur Respiratory: Normal respiratory effort without  tachypnea nor retractions. Breath sounds are clear  Gastrointestinal: Soft and nontender. No distention.   Musculoskeletal: Nontender with normal range of motion in all extremities. No tract marks seen. Neurologic:  Normal speech and language. No gross focal neurologic deficits  Skin:  Skin is warm, dry and intact.  Psychiatric: Mood and affect are normal.   ____________________________________________   INITIAL IMPRESSION / ASSESSMENT AND PLAN / ED COURSE  Pertinent labs & imaging results that were available during my care of the patient were reviewed by me and considered in my medical decision making (see chart for details).  Patient was sent to the emergency department for heroin detox. No medical complaints at this time. We will check labs. I discussed with [facility] they will call [facility] for bed availability. Patient agreeable to this plan.   Patient has eloped from the hospital. Stated to the nurse that he did not want to go to [facility].  ____________________________________________   FINAL CLINICAL IMPRESSION(S) / ED DIAGNOSES  Heroin detox    Minna AntisPaduchowski, Shann Lewellyn, MD 10/13/16 1954

## 2016-10-23 DIAGNOSIS — F13930 Sedative, hypnotic or anxiolytic use, unspecified with withdrawal, uncomplicated: Secondary | ICD-10-CM | POA: Insufficient documentation

## 2019-06-01 ENCOUNTER — Emergency Department (HOSPITAL_COMMUNITY)
Admission: EM | Admit: 2019-06-01 | Discharge: 2019-06-01 | Disposition: A | Discharge status: Home / Self Care | Payer: Self-pay | Attending: Emergency Medicine | Admitting: Emergency Medicine

## 2019-06-01 ENCOUNTER — Emergency Department (HOSPITAL_COMMUNITY): Payer: Self-pay

## 2019-06-01 DIAGNOSIS — Y9389 Activity, other specified: Secondary | ICD-10-CM | POA: Insufficient documentation

## 2019-06-01 DIAGNOSIS — Y998 Other external cause status: Secondary | ICD-10-CM | POA: Diagnosis not present

## 2019-06-01 DIAGNOSIS — F410 Panic disorder [episodic paroxysmal anxiety] without agoraphobia: Secondary | ICD-10-CM | POA: Diagnosis not present

## 2019-06-01 DIAGNOSIS — S0990XA Unspecified injury of head, initial encounter: Secondary | ICD-10-CM | POA: Diagnosis present

## 2019-06-01 DIAGNOSIS — F17210 Nicotine dependence, cigarettes, uncomplicated: Secondary | ICD-10-CM | POA: Diagnosis not present

## 2019-06-01 DIAGNOSIS — R4189 Other symptoms and signs involving cognitive functions and awareness: Secondary | ICD-10-CM | POA: Insufficient documentation

## 2019-06-01 DIAGNOSIS — F1210 Cannabis abuse, uncomplicated: Secondary | ICD-10-CM | POA: Insufficient documentation

## 2019-06-01 DIAGNOSIS — Z8673 Personal history of transient ischemic attack (TIA), and cerebral infarction without residual deficits: Secondary | ICD-10-CM | POA: Insufficient documentation

## 2019-06-01 DIAGNOSIS — Z950 Presence of cardiac pacemaker: Secondary | ICD-10-CM | POA: Insufficient documentation

## 2019-06-01 DIAGNOSIS — I252 Old myocardial infarction: Secondary | ICD-10-CM | POA: Insufficient documentation

## 2019-06-01 DIAGNOSIS — F1410 Cocaine abuse, uncomplicated: Secondary | ICD-10-CM | POA: Diagnosis not present

## 2019-06-01 DIAGNOSIS — Y92410 Unspecified street and highway as the place of occurrence of the external cause: Secondary | ICD-10-CM | POA: Diagnosis not present

## 2019-06-01 DIAGNOSIS — F1510 Other stimulant abuse, uncomplicated: Secondary | ICD-10-CM | POA: Insufficient documentation

## 2019-06-01 NOTE — ED Notes (Signed)
Pt again refusing workman comp urine drug test. ED PA made aware.

## 2019-06-01 NOTE — ED Notes (Signed)
Patient Alert and oriented to baseline. Stable and ambulatory to baseline. Patient verbalized understanding of the discharge instructions.  Patient belongings were taken by the patient.   

## 2019-06-01 NOTE — Discharge Instructions (Addendum)
Return here as needed. °
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
## 2019-06-01 NOTE — ED Notes (Signed)
Pt refuse the urine drug test  For employer
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
## 2020-05-10 ENCOUNTER — Emergency Department (HOSPITAL_COMMUNITY): Payer: Self-pay

## 2020-05-10 ENCOUNTER — Other Ambulatory Visit: Payer: Self-pay

## 2020-05-10 ENCOUNTER — Observation Stay (HOSPITAL_COMMUNITY)
Admission: EM | Admit: 2020-05-10 | Discharge: 2020-05-11 | Disposition: A | Discharge status: Home / Self Care | Payer: Self-pay | Attending: Cardiovascular Disease | Admitting: Cardiovascular Disease

## 2020-05-10 DIAGNOSIS — R072 Precordial pain: Principal | ICD-10-CM | POA: Insufficient documentation

## 2020-05-10 DIAGNOSIS — R079 Chest pain, unspecified: Secondary | ICD-10-CM

## 2020-05-10 DIAGNOSIS — F17210 Nicotine dependence, cigarettes, uncomplicated: Secondary | ICD-10-CM | POA: Insufficient documentation

## 2020-05-10 DIAGNOSIS — Z950 Presence of cardiac pacemaker: Secondary | ICD-10-CM | POA: Insufficient documentation

## 2020-05-10 DIAGNOSIS — R0600 Dyspnea, unspecified: Secondary | ICD-10-CM

## 2020-05-10 DIAGNOSIS — Z720 Tobacco use: Secondary | ICD-10-CM

## 2020-05-10 DIAGNOSIS — R0609 Other forms of dyspnea: Secondary | ICD-10-CM | POA: Insufficient documentation

## 2020-05-10 LAB — BASIC METABOLIC PANEL
Anion gap: 9 (ref 5–15)
BUN: 13 mg/dL (ref 6–20)
CO2: 27 mmol/L (ref 22–32)
Calcium: 9 mg/dL (ref 8.9–10.3)
Chloride: 102 mmol/L (ref 98–111)
Creatinine, Ser: 1.02 mg/dL (ref 0.61–1.24)
GFR, Estimated: >60 mL/min (ref >60)
Glucose, Bld: 120 mg/dL — ABNORMAL HIGH (ref 70–99)
Potassium: 4.2 mmol/L (ref 3.5–5.1)
Sodium: 138 mmol/L (ref 135–145)

## 2020-05-10 LAB — TROPONIN I (HIGH SENSITIVITY)
Troponin I (High Sensitivity): 3 ng/L (ref <18)
Troponin I (High Sensitivity): 3 ng/L (ref <18)

## 2020-05-10 LAB — CBC
HCT: 46.6 % (ref 39.0–52.0)
Hemoglobin: 15.7 g/dL (ref 13.0–17.0)
MCH: 29.8 pg (ref 26.0–34.0)
MCHC: 33.7 g/dL (ref 30.0–36.0)
MCV: 88.4 fL (ref 80.0–100.0)
Platelets: 208 10*3/uL (ref 150–400)
RBC: 5.27 MIL/uL (ref 4.22–5.81)
RDW: 14 % (ref 11.5–15.5)
WBC: 7.7 10*3/uL (ref 4.0–10.5)
nRBC: 0 % (ref 0.0–0.2)

## 2020-05-10 LAB — HIV ANTIBODY (ROUTINE TESTING W REFLEX): HIV Screen 4th Generation wRfx: NONREACTIVE

## 2020-05-10 LAB — RAPID URINE DRUG SCREEN, HOSP PERFORMED
Amphetamines: NOT DETECTED
Barbiturates: NOT DETECTED
Benzodiazepines: POSITIVE — AB
Cocaine: NOT DETECTED
Opiates: NOT DETECTED
Tetrahydrocannabinol: POSITIVE — AB

## 2020-05-10 MED ORDER — NICOTINE 21 MG/24HR TD PT24
21.0000 mg | MEDICATED_PATCH | Freq: Every day | TRANSDERMAL | Status: DC
  Administered 2020-05-10: 21 mg via TRANSDERMAL
  Filled 2020-05-10: qty 1

## 2020-05-10 MED ORDER — ASPIRIN 81 MG PO CHEW
324.0000 mg | CHEWABLE_TABLET | ORAL | Status: AC
  Administered 2020-05-10: 324 mg via ORAL
  Filled 2020-05-10: qty 4

## 2020-05-10 MED ORDER — ASPIRIN 300 MG RE SUPP: 300.0000 mg | RECTAL | Status: AC

## 2020-05-10 MED ORDER — NITROGLYCERIN 0.4 MG SL SUBL: 0.4000 mg | SUBLINGUAL_TABLET | SUBLINGUAL | Status: DC | PRN

## 2020-05-10 MED ORDER — ASPIRIN EC 81 MG PO TBEC: 81.0000 mg | DELAYED_RELEASE_TABLET | Freq: Every day | ORAL | Status: DC

## 2020-05-10 MED ORDER — NICOTINE 21 MG/24HR TD PT24: 21.0000 mg | MEDICATED_PATCH | Freq: Every day | TRANSDERMAL | Status: DC

## 2020-05-10 MED ORDER — METOPROLOL TARTRATE 25 MG PO TABS: 50.0000 mg | ORAL_TABLET | Freq: Once | ORAL | Status: DC

## 2020-05-10 MED ORDER — ACETAMINOPHEN 325 MG PO TABS: 650.0000 mg | ORAL_TABLET | ORAL | Status: DC | PRN

## 2020-05-10 MED ORDER — OXYCODONE HCL 5 MG PO TABS
10.0000 mg | ORAL_TABLET | Freq: Three times a day (TID) | ORAL | Status: DC | PRN
  Administered 2020-05-10 (×2): 10 mg via ORAL
  Filled 2020-05-10 (×2): qty 2

## 2020-05-10 MED ORDER — CLONAZEPAM 0.5 MG PO TABS
1.0000 mg | ORAL_TABLET | Freq: Four times a day (QID) | ORAL | Status: DC | PRN
  Administered 2020-05-10 (×2): 1 mg via ORAL
  Filled 2020-05-10 (×2): qty 2

## 2020-05-10 MED ORDER — ONDANSETRON HCL 4 MG/2ML IJ SOLN: 4.0000 mg | Freq: Four times a day (QID) | INTRAMUSCULAR | Status: DC | PRN

## 2020-05-10 MED ORDER — ENOXAPARIN SODIUM 40 MG/0.4ML ~~LOC~~ SOLN
40.0000 mg | SUBCUTANEOUS | Status: DC
Start: 2020-05-10 — End: 2020-05-11
  Administered 2020-05-10: 40 mg via SUBCUTANEOUS
  Filled 2020-05-10: qty 0.4

## 2020-05-10 NOTE — H&P (Signed)
Cardiology Admission History and Physical:   Patient ID: [name] [name] MRN: [id number]; DOB: [birth year]   Admission date: 05/10/2020  PCP:  [facility]  Cardiologist:  New to [facility]; follows at [facility] Advanced Practice Provider:  No care team member to display Electrophysiologist:  None   Chief Complaint:  Chest pain  Patient Profile:   [name] [name] is a 55 y.o. male with a PMH of bipolar disorder with prior psychiatric admission in 2018 for SI, tobacco abuse and polysubstance abuse,  who is being seen today for the evaluation of chest pain at the request of Dr. Myrtis Ser.  History of Present Illness:   Mr. [name] [name] was in his usual state of health until a couple weeks ago when he began experiencing intermittent chest pain and SOB. He reports he has been under a significant amount of stress recently but did not want to elaborate. He did volunteer that he is facing a 17 year sentence. He reports for the past week he has been experiencing several episodes of chest pain radiating to his arms which often last for 74min-1 hour before resolving spontaneously. These are sometimes associated with diaphoresis and SOB. He also reports DOE over the past 2 weeks. He states he has had some orthopnea but no PND or LE edema. He has chronic pain in his legs for which he takes oxycodone and is asking for medications. He reports occasional dizziness but no syncope. He reports daily marijuana use and states he smokes it as much as he can. He continues to smoke cigarettes as well, 1ppd, but has no interest in quitting. He tried chantix in the past but had terrible nightmares. He was seen by his PCP/cardiology PA, Aris Lot, earlier today for his chest pain complaints. He was given SL nitro in the office with resolution of arm pain and was recommended to present to the ED for further evaluation.   He denies prior cardiac history and reports no  personal history of HTN, HLD, or DM type 2. He has a strong family history and reports mother, father, and grandfather all died from MI's in their 67s. He had an echocardiogram in 2016 which showed EF 50-55%, G1DD, no RWMA, and no significant valvular abnormalities. Appears he had a NST in 2013 to evaluate chest pain which was without ischemia.   ED course: VSS. Labs notable for electrolytes wnl, Cr 1.02, CBC wnl, HsTrop 3. EKG with sinus rhythm with rate 74 bpm, no STE/D, no TWI; overall appears improved from EKG 05/2019 which showed inferior/anterolateral T wave abnormalities. CXR without acute findings. Cardiology asked to evaluate.   Past Medical History:  Diagnosis Date  . Arthritis   . Back pain   . Bipolar disorder (HCC)   . MI (myocardial infarction) (HCC)    at age 38  . Polysubstance abuse   . TIA (transient ischemic attack)   . Tobacco abuse     Past Surgical History:  Procedure Laterality Date  . FOOT SURGERY    . PACEMAKER PLACEMENT       Home Medications:  Prior to Admission medications   Medication Sig Start Date End Date Taking? Authorizing Provider  chlordiazePOXIDE (LIBRIUM) 25 MG capsule Take 1 capsule (25 mg total) by mouth 4 (four) times daily. 08/10/14   Jimmy Footman, MD  chlordiazePOXIDE (LIBRIUM) 25 MG capsule Take 1 capsule (25 mg total) by mouth 3 (three) times daily. 08/10/14   Jimmy Footman, MD  fluticasone (FLONASE) 50 MCG/ACT nasal spray Place 2 sprays into both nostrils 2 (two) times daily as needed. 12/21/19   [provider]  gabapentin (NEURONTIN) 400 MG capsule Take 1 capsule (400 mg total) by mouth 3 (three) times daily. 08/10/14   Jimmy Footman, MD  meloxicam (MOBIC) 15 MG tablet Take 1 tablet (15 mg total) by mouth daily. 08/10/14   Jimmy Footman, MD  nicotine (NICODERM CQ - DOSED IN MG/24 HOURS) 21 mg/24hr patch Place 1 patch (21 mg total) onto the skin daily. 08/10/14   Jimmy Footman, MD    Inpatient Medications: Scheduled Meds:  Continuous Infusions:  PRN Meds:   Allergies:    Allergies  Allergen Reactions  . Vicodin [Hydrocodone-Acetaminophen] Nausea And Vomiting    Social History:   Social History   Socioeconomic History  . Marital status: Single    Spouse name: Not on file  . Number of children: Not on file  . Years of education: Not on file  . Highest education level: Not on file  Occupational History  . Not on file  Tobacco Use  . Smoking status: Current Every Day Smoker    Types: Cigarettes  . Smokeless tobacco: Not on file  Substance and Sexual Activity  . Alcohol use: No  . Drug use: Yes    Types: Cocaine, Marijuana, Methamphetamines  . Sexual activity: Not on file  Other Topics Concern  . Not on file  Social History Narrative  . Not on file   Social Determinants of Health   Financial Resource Strain: Not on file  Food Insecurity: Not on file  Transportation Needs: Not on file  Physical Activity: Not on file  Stress: Not on file  Social Connections: Not on file  Intimate Partner Violence: Not on file    Family History:    Family History  Problem Relation Age of Onset  . Heart failure Mother   . Hypertension Mother   . Stroke Mother      ROS:  Please see the history of present illness.   All other ROS reviewed and negative.     Physical Exam/Data:   Vitals:   05/10/20 1153 05/10/20 1413 05/10/20 1515 05/10/20 1530  BP: 113/85 117/85 110/80 119/84  Pulse: 77 70 68 79  Resp: 20 12 16 15   Temp: 97.9 F (36.6 C)     TempSrc: Oral     SpO2: 96% 100% 98% 100%   No intake or output data in the 24 hours ending 05/10/20 1636 Last 3 Weights 06/01/2019 10/13/2016 09/17/2014  Weight (lbs) 215 lb 195 lb 80 lb  Weight (kg) 97.523 kg 88.451 kg 36.288 kg  Some encounter information is confidential and restricted. Go to Review Flowsheets activity to see all data.     There is no height or weight on file to calculate  BMI.  General:  Well nourished, well developed, in no acute distress HEENT: sclera anicteric Neck: no JVD Vascular: No carotid bruits; distal pulses 2+ bilaterally  Cardiac:  normal S1, S2; RRR; no murmurs, rubs, or gallops Lungs:  clear to auscultation bilaterally, no wheezing, rhonchi or rales  Abd: soft, nontender, no hepatomegaly  Ext: no edema Musculoskeletal:  No deformities, BUE and BLE strength normal and equal Skin: warm and dry  Neuro:  CNs 2-12 intact, no focal abnormalities noted Psych:  Normal affect   EKG:  The EKG was personally reviewed and demonstrates:  Sinus rhythm with rate 74 bpm, no STE/D, no TWI; overall appears improved  from EKG 05/2019 which showed inferior/anterolateral T wave abnormalities Telemetry:  Telemetry was personally reviewed and demonstrates:  Sinus rhythm  Relevant CV Studies: Echocardiogram 2016: - Left ventricle: The cavity size was normal. Wall thickness was  normal. Systolic function was normal. The estimated ejection  fraction was in the range of 50% to 55%. Wall motion was normal;  there were no regional wall motion abnormalities. Doppler  parameters are consistent with abnormal left ventricular  relaxation (grade 1 diastolic dysfunction).  NST 2013: INDICATION:   Chest pain, angina.   The patient was brought to the exercise stress lab and placed in Lexiscan  protocol. Baseline blood pressure was 110/77 and pulse of 82. EKG normal  sinus rhythm with LVH and nonspecific findings. He received 0.4 mg of  Lexiscan over a few seconds and then was injected with 30.41 mCi of  Myoview.  Resting dose was 14.25 mCi. He was taken to the Myoview camera for rest  and  stress images. Grayscale, color and SPECT images suggest adequate uptake  and  wash-out of Myoview with no significant stress induced defects. Ejection  fraction of 59%.     Laboratory Data:  High Sensitivity Troponin:   Recent Labs  Lab 05/10/20 1211  TROPONINIHS  3     Chemistry Recent Labs  Lab 05/10/20 1211  NA 138  K 4.2  CL 102  CO2 27  GLUCOSE 120*  BUN 13  CREATININE 1.02  CALCIUM 9.0  GFRNONAA >60  ANIONGAP 9    No results for input(s): PROT, ALBUMIN, AST, ALT, ALKPHOS, BILITOT in the last 168 hours. Hematology Recent Labs  Lab 05/10/20 1211  WBC 7.7  RBC 5.27  HGB 15.7  HCT 46.6  MCV 88.4  MCH 29.8  MCHC 33.7  RDW 14.0  PLT 208   BNPNo results for input(s): BNP, PROBNP in the last 168 hours.  DDimer No results for input(s): DDIMER in the last 168 hours.   Radiology/Studies:  DG Chest 2 View  Result Date: 05/10/2020 CLINICAL DATA:  Chest pain EXAM: CHEST - 2 VIEW COMPARISON:  March 17, 21. FINDINGS: The heart size and mediastinal contours are within normal limits. Both lungs are clear. No visible pleural effusions or pneumothorax. No acute osseous abnormality. IMPRESSION: No active cardiopulmonary disease. Electronically Signed   By: Feliberto HartsFrederick S Jones MD   On: 05/10/2020 13:17     Assessment and Plan:   1. Chest pain/DOE in patient with no known CAD: patient presented with chest pain. No clear exertional component to his chest pain, though he has been experiencing DOE for the past 2 weeks.  Sent from PCP office after symptoms resolved with SL nitro. EKG on arrival here is non-ischemic and actually improved from 05/2019 which showed nearly diffuse T wave abnormalities. HsTrop was 3, repeat ordered but not completed yet. Echo in 2016 showed EF 50-55%, G1DD, no RWMA, and no valvular abnormalities. Appears his last ischemic evaluation was a NST in 2013 which was without ischemia.  - Will follow-up 2nd trop - Will plan for a coronary CTA if HsTrop negative - Will update an echocardiogram to evaluate LV function, wall motion, and valvular function  - Will check a FLP and A1C for risk stratification  2. Tobacco abuse: smokes 1ppd. Not interested in quitting. Intolerant to chantix due to nightmares - Continue to encourage  cessation - Will order nicotine patch while admitted.   3. Polysubstance abuse: smokes marijuana daily - Will check Utox - Continue to encourage cessation/ abstinence  4. Bipolar disorder: has had prior psychiatric admissions. Currently takes prn clonazepam.  - Continue home medications with hold parameters for sedation or RR<12  5. Chronic pain: he has chronic LE pain for which he takes oxycodone prn. - Continue home medications with hold parameters for sedation or RR<12  Risk Assessment/Risk Scores:     HEAR Score (for undifferentiated chest pain):  HEAR Score: 4  New York Heart Association (NYHA) Functional Class NYHA Class II         Severity of Illness: The appropriate patient status for this patient is OBSERVATION. Observation status is judged to be reasonable and necessary in order to provide the required intensity of service to ensure the patient's safety. The patient's presenting symptoms, physical exam findings, and initial radiographic and laboratory data in the context of their medical condition is felt to place them at decreased risk for further clinical deterioration. Furthermore, it is anticipated that the patient will be medically stable for discharge from the hospital within 2 midnights of admission. The following factors support the patient status of observation.   " The patient's presenting symptoms include chest pain and DOE. " The physical exam findings include benign cardiopulmonary exam. " The initial radiographic and laboratory data are negative HsTrop and non-ischemic EKG.     For questions or updates, please contact [facility] Please consult [email or web address] for contact info under     Signed, Beatriz Stallion, PA-C  05/10/2020 4:36 PM

## 2020-05-10 NOTE — ED Provider Notes (Cosign Needed)
  Physical Exam  BP 117/85   Pulse 70   Temp 97.9 F (36.6 C) (Oral)   Resp 12   SpO2 100%   Physical Exam Vitals and nursing note reviewed.  Constitutional:      General: He is not in acute distress.    Appearance: Normal appearance. He is well-developed and well-nourished. He is not ill-appearing.  HENT:     Head: Normocephalic and atraumatic.     Right Ear: External ear normal.     Left Ear: External ear normal.     Nose: Nose normal.  Eyes:     General: No scleral icterus.       Right eye: No discharge.        Left eye: No discharge.     Conjunctiva/sclera: Conjunctivae normal.  Cardiovascular:     Rate and Rhythm: Normal rate and regular rhythm.  Pulmonary:     Effort: Pulmonary effort is normal. No respiratory distress.  Musculoskeletal:        General: No edema.     Cervical back: Neck supple.  Skin:    General: Skin is warm and dry.     Findings: No rash.  Neurological:     General: No focal deficit present.     Mental Status: He is alert and oriented to person, place, and time.  Psychiatric:        Mood and Affect: Mood and affect and mood normal.        Behavior: Behavior normal.     ED Course/Procedures     Procedures None  MDM  Assumed care from [facility] PA-C at 1500. Please refer to his note for full H&P and initial MDM. Plan at time of sign out included dispo per Cardiology recommendations.  On exam, patient resting comfortably and in no acute distress. Cardiology evaluated patient in ED and will admit to their service for further cardiac workup and evaluation. No acute events while under my care. Patient in stable condition at time of admission.    Tonia Brooms, MD 05/11/20 [phone]

## 2020-05-10 NOTE — ED Notes (Signed)
Patient refused COVID test.

## 2020-05-10 NOTE — ED Triage Notes (Signed)
Pt sent over by MD for an abnormal ekg , pt is c/o chest pain  , no son or nausea , EKG from offices shows PAc's

## 2020-05-10 NOTE — ED Provider Notes (Signed)
[facility] EMERGENCY DEPARTMENT Provider Note   CSN: [id number] Arrival date & time: 05/10/20  1149     History No chief complaint on file.   [name] [name] is a 55 y.o. male.  Patient with history of polysubstance abuse presents the emergency department today for evaluation of chest pain.  Patient states that he was at [facility] and was sent to the emergency department for cardiology evaluation given chest pain.  Patient describes pain in the left chest with radiation to the left neck and left arm, intermittently over the past week.  Symptoms occur at rest and are not exertional.  No associated vomiting or diaphoresis.  Patient states that he had symptoms while at his doctor's office today.  He was given nitroglycerin which resolved his symptoms.  Currently asymptomatic suffer headache from the nitroglycerin.  Patient denies fever or chills, cough or shortness of breath.  He denies previous stenting or stress test.  He denies history of hypertension, hypercholesterolemia, diabetes.  Patient is an everyday smoker and has a very strong family history of cardiac disease in first-degree relatives including parents, brother and sister.        Past Medical History:  Diagnosis Date  . Arthritis   . Back pain   . Bipolar disorder (HCC)   . MI (myocardial infarction) (HCC)    at age 89  . Polysubstance abuse   . TIA (transient ischemic attack)   . Tobacco abuse     Patient Active Problem List   Diagnosis Date Noted  . 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 Cannabis use disorder, severe, dependence (HCC) 08/09/2014  . Opioid use disorder, severe, dependence (HCC) 08/09/2014  . Stimulant use disorder (cocaine use disorder) 08/09/2014  . Tobacco use disorder 08/09/2014  . Antisocial personality disorder (HCC) 08/09/2014  . Left-sided weakness  06/14/2014  . Chronic joint pain 06/14/2014    Past Surgical History:  Procedure Laterality Date  . FOOT SURGERY    . PACEMAKER PLACEMENT         Family History  Problem Relation Age of Onset  . Heart failure Mother   . Hypertension Mother   . Stroke Mother     Social History   Tobacco Use  . Smoking status: Current Every Day Smoker    Types: Cigarettes  Substance Use Topics  . Alcohol use: No  . Drug use: Yes    Types: Cocaine, Marijuana, Methamphetamines    Home Medications Prior to Admission medications   Medication Sig Start Date End Date Taking? Authorizing Provider  chlordiazePOXIDE (LIBRIUM) 25 MG capsule Take 1 capsule (25 mg total) by mouth 4 (four) times daily. 08/10/14   Jimmy Footman, MD  chlordiazePOXIDE (LIBRIUM) 25 MG capsule Take 1 capsule (25 mg total) by mouth 3 (three) times daily. 08/10/14   Jimmy Footman, MD  gabapentin (NEURONTIN) 400 MG capsule Take 1 capsule (400 mg total) by mouth 3 (three) times daily. 08/10/14   Jimmy Footman, MD  meloxicam (MOBIC) 15 MG tablet Take 1 tablet (15 mg total) by mouth daily. 08/10/14   Jimmy Footman, MD  nicotine (NICODERM CQ - DOSED IN MG/24 HOURS) 21 mg/24hr patch Place 1 patch (21 mg total) onto the skin daily. 08/10/14   Jimmy Footman, MD    Allergies    Vicodin [hydrocodone-acetaminophen]  Review of Systems   Review of Systems  Constitutional: Negative for diaphoresis and fever.  Eyes: Negative for redness.  Respiratory: Negative for cough and shortness of breath.   Cardiovascular: Positive for chest pain. Negative for palpitations and leg swelling.  Gastrointestinal: Negative for abdominal pain, nausea and vomiting.  Genitourinary: Negative for dysuria.  Musculoskeletal: Negative for back pain and neck pain.  Skin: Negative for rash.  Neurological: Negative for syncope and light-headedness.  Psychiatric/Behavioral: The patient is not  nervous/anxious.     Physical Exam Updated Vital Signs BP 113/85 (BP Location: Left Arm)   Pulse 77   Temp 97.9 F (36.6 C) (Oral)   Resp 20   SpO2 96%   Physical Exam Vitals and nursing note reviewed.  Constitutional:      Appearance: He is well-developed and well-nourished. He is not diaphoretic.  HENT:     Head: Normocephalic and atraumatic.     Mouth/Throat:     Mouth: Mucous membranes are normal. Mucous membranes are not dry.  Eyes:     Conjunctiva/sclera: Conjunctivae normal.  Neck:     Vascular: Normal carotid pulses. No carotid bruit or JVD.     Trachea: Trachea normal. No tracheal deviation.  Cardiovascular:     Rate and Rhythm: Normal rate and regular rhythm.     Pulses: Intact distal pulses. No decreased pulses.     Heart sounds: Normal heart sounds, S1 normal and S2 normal. Heart sounds not distant. No murmur heard.   Pulmonary:     Effort: Pulmonary effort is normal. No respiratory distress.     Breath sounds: Normal breath sounds. No wheezing.  Chest:     Chest wall: No tenderness.  Abdominal:     General: Bowel sounds are normal. Aorta is normal.     Palpations: Abdomen is soft.     Tenderness: There is no abdominal tenderness. There is no guarding or rebound.  Musculoskeletal:        General: No edema.     Cervical back: Normal range of motion and neck supple. No muscular tenderness.  Skin:    General: Skin is warm and dry.     Coloration: Skin is not pale.     Nails: There is no cyanosis.  Neurological:     Mental Status: He is alert.  Psychiatric:        Mood and Affect: Mood and affect normal.     ED Results / Procedures / Treatments   Labs (all labs ordered are listed, but only abnormal results are displayed) Labs Reviewed  BASIC METABOLIC PANEL - Abnormal; Notable for the following components:      Result Value   Glucose, Bld 120 (*)    All other components within normal limits  CBC  TROPONIN I (HIGH SENSITIVITY)  TROPONIN I (HIGH  SENSITIVITY)    EKG None  Radiology DG Chest 2 View  Result Date: 05/10/2020 CLINICAL DATA:  Chest pain EXAM: CHEST - 2 VIEW COMPARISON:  March 17, 21. FINDINGS: The heart size and mediastinal contours are within normal limits. Both lungs are clear. No visible pleural effusions or pneumothorax. No acute osseous abnormality. IMPRESSION: No active cardiopulmonary disease. Electronically Signed   By: Feliberto Harts MD   On: 05/10/2020 13:17    Procedures Procedures   Medications Ordered in ED Medications - No data to display  ED Course  I have reviewed the triage vital signs and the nursing notes.  Pertinent labs & imaging results that were available during my care of the patient were reviewed by me and considered in my medical decision  making (see chart for details).  Patient seen and examined. Work-up initiated. EKG reviewed, normal.   Vital signs reviewed and are as follows: BP 113/85 (BP Location: Left Arm)   Pulse 77   Temp 97.9 F (36.6 C) (Oral)   Resp 20   SpO2 96%   2:26 PM First trop nml. Discussed with Trish of cardiology, consult requested and they will see. Awaiting 2nd trop.   3:04 PM Signout to Dr. Mellody Dance at shift change. Dispo per cardiology consult. Awaiting 2nd trop. Pt updated on plan, family at bedside, currently pain free and comfortable.     MDM Rules/Calculators/A&P                          Pending completion of work-up.   Final Clinical Impression(s) / ED Diagnoses Final diagnoses:  Precordial pain    Rx / DC Orders ED Discharge Orders    None       Renne Crigler, PA-C 05/10/20 1505    Sabino Donovan, MD 05/17/20 1149

## 2020-05-11 LAB — LIPID PANEL
Cholesterol: 166 mg/dL (ref 0–200)
HDL: 31 mg/dL — ABNORMAL LOW (ref >40)
LDL Cholesterol: 113 mg/dL — ABNORMAL HIGH (ref 0–99)
Total CHOL/HDL Ratio: 5.4 RATIO
Triglycerides: 112 mg/dL (ref <150)
VLDL: 22 mg/dL (ref 0–40)

## 2020-05-11 LAB — HEMOGLOBIN A1C
Hgb A1c MFr Bld: 5.1 % (ref 4.8–5.6)
Mean Plasma Glucose: 99.67 mg/dL

## 2020-05-11 LAB — BASIC METABOLIC PANEL
Anion gap: 6 (ref 5–15)
BUN: 16 mg/dL (ref 6–20)
CO2: 30 mmol/L (ref 22–32)
Calcium: 8.8 mg/dL — ABNORMAL LOW (ref 8.9–10.3)
Chloride: 103 mmol/L (ref 98–111)
Creatinine, Ser: 1.13 mg/dL (ref 0.61–1.24)
GFR, Estimated: >60 mL/min (ref >60)
Glucose, Bld: 94 mg/dL (ref 70–99)
Potassium: 4.6 mmol/L (ref 3.5–5.1)
Sodium: 139 mmol/L (ref 135–145)

## 2020-05-11 NOTE — ED Notes (Addendum)
Pt used call bell to contact nurse to come to room. Upon entering pt expressed "I want to leave this loud [facility] hospital, take this thing out of my arm so I can leave." Pt asked to wait momentarily. Charge nurse notified and admitting MD paged.

## 2020-05-11 NOTE — ED Notes (Signed)
Patient approached bridge desk, yelling for someone in charge, patient reports he is upset because he felt like no one was checking on him all night, reports he "has been asking for a benadryl and something for sinus congestion for hours, my heart monitor has not been on and I have a serious heart problem going on, I could be dead and no one would know, the nurse has been playing pokemon all night and only saw me when I would walk to the bathroom". Patient alert and oriented x 4, ambulated with steady gait out of department with wife. This RN witnessed [place], Charity fundraiser entering patient's room multiple times while dropping other patients off in holding area.

## 2020-05-11 NOTE — ED Notes (Addendum)
Pt verbally abusive with staff, states that he does not want to hear monitors and no one has been in his room to check on him, explained to pt that staff has been in to check on him,including this RN, pt yelling at staff and name calling. Pt then went to charge desk and asked to speak to charge RN, when I came to speak to the pt again pt stated "You again, your an idiot, I don't want to talk to you" pt let AMA, admitting aware. Advised pt of risk and benefits of leaving AMA.

## 2020-05-11 NOTE — ED Notes (Signed)
Admitting MD called and was notified that patient left AMA.

## 2020-05-11 NOTE — ED Notes (Addendum)
Pt in hospital bed with wife, asking for medication to help him sleep.

## 2020-05-11 NOTE — ED Notes (Signed)
Pt dressed upon returning to room, pt notified that MD has been paged that he wants to leave and that he is advised to stay and receive care. Attempts to deescalate situation failed as patient yelled, "I don't want to hear any of that shit"  Pt reported "no one has been in here to check on me all night, you haven't been able to do anything for me." Pt became increasingly verbally aggressive, cursing at staff. This RN removed pt's IV and pt directed towards exit.

## 2020-06-21 ENCOUNTER — Emergency Department (HOSPITAL_COMMUNITY)
Admission: EM | Admit: 2020-06-21 | Discharge: 2020-06-21 | Disposition: A | Discharge status: Home / Self Care | Payer: Self-pay | Attending: Emergency Medicine | Admitting: Emergency Medicine

## 2020-06-21 ENCOUNTER — Emergency Department (HOSPITAL_COMMUNITY): Payer: Self-pay

## 2020-06-21 ENCOUNTER — Other Ambulatory Visit: Payer: Self-pay

## 2020-06-21 DIAGNOSIS — I2510 Atherosclerotic heart disease of native coronary artery without angina pectoris: Secondary | ICD-10-CM | POA: Insufficient documentation

## 2020-06-21 DIAGNOSIS — F17210 Nicotine dependence, cigarettes, uncomplicated: Secondary | ICD-10-CM | POA: Insufficient documentation

## 2020-06-21 DIAGNOSIS — Z8673 Personal history of transient ischemic attack (TIA), and cerebral infarction without residual deficits: Secondary | ICD-10-CM | POA: Insufficient documentation

## 2020-06-21 DIAGNOSIS — Z950 Presence of cardiac pacemaker: Secondary | ICD-10-CM | POA: Insufficient documentation

## 2020-06-21 DIAGNOSIS — R079 Chest pain, unspecified: Secondary | ICD-10-CM | POA: Insufficient documentation

## 2020-06-21 DIAGNOSIS — I509 Heart failure, unspecified: Secondary | ICD-10-CM | POA: Insufficient documentation

## 2020-06-21 DIAGNOSIS — R0602 Shortness of breath: Secondary | ICD-10-CM | POA: Insufficient documentation

## 2020-06-21 LAB — BASIC METABOLIC PANEL
Anion gap: 7 (ref 5–15)
BUN: 14 mg/dL (ref 6–20)
CO2: 31 mmol/L (ref 22–32)
Calcium: 9.1 mg/dL (ref 8.9–10.3)
Chloride: 101 mmol/L (ref 98–111)
Creatinine, Ser: 1.1 mg/dL (ref 0.61–1.24)
GFR, Estimated: >60 mL/min (ref >60)
Glucose, Bld: 86 mg/dL (ref 70–99)
Potassium: 4 mmol/L (ref 3.5–5.1)
Sodium: 139 mmol/L (ref 135–145)

## 2020-06-21 LAB — CBC
HCT: 46.6 % (ref 39.0–52.0)
Hemoglobin: 15.4 g/dL (ref 13.0–17.0)
MCH: 29.6 pg (ref 26.0–34.0)
MCHC: 33 g/dL (ref 30.0–36.0)
MCV: 89.4 fL (ref 80.0–100.0)
Platelets: 420 10*3/uL — ABNORMAL HIGH (ref 150–400)
RBC: 5.21 MIL/uL (ref 4.22–5.81)
RDW: 12.6 % (ref 11.5–15.5)
WBC: 7.6 10*3/uL (ref 4.0–10.5)
nRBC: 0 % (ref 0.0–0.2)

## 2020-06-21 LAB — TROPONIN I (HIGH SENSITIVITY)
Troponin I (High Sensitivity): 3 ng/L (ref <18)
Troponin I (High Sensitivity): 2 ng/L (ref <18)

## 2020-06-21 LAB — BRAIN NATRIURETIC PEPTIDE: B Natriuretic Peptide: 9.4 pg/mL (ref 0.0–100.0)

## 2020-06-21 MED ORDER — ACETAMINOPHEN 500 MG PO TABS
1000.0000 mg | ORAL_TABLET | Freq: Once | ORAL | Status: DC
  Filled 2020-06-21: qty 2

## 2020-06-21 NOTE — ED Provider Notes (Signed)
[facility] EMERGENCY DEPARTMENT Provider Note   CSN: [id number] Arrival date & time: 06/21/20  1614     History Chief Complaint  Patient presents with  . Chest Pain    [name] [name] is a 55 y.o. male.  HPI Patient is a 88 YOM with a history of CHF and CAD presenting with 1 week of subjective SOB and 1 day of CP. Patient states that he is having center of chest pain going to his left shoulder. He denies fevers or chills, nausea or vomitting, syncope or abdominal pain. He has been compliant on his medical therapy at home. Patient states that he was recently able to complete a backpacking trip but was not as active as he had prior been.   Past Medical History:  Diagnosis Date  . Arthritis   . Back pain   . Bipolar disorder (HCC)   . MI (myocardial infarction) (HCC)    at age 59  . Polysubstance abuse   . TIA (transient ischemic attack)   . Tobacco abuse     Patient Active Problem List   Diagnosis Date Noted  . Precordial pain 05/10/2020  . Exertional dyspnea   . 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 Cannabis use disorder, severe, dependence (HCC) 08/09/2014  . Opioid use disorder, severe, dependence (HCC) 08/09/2014  . Stimulant use disorder (cocaine use disorder) 08/09/2014  . Tobacco abuse 08/09/2014  . Antisocial personality disorder (HCC) 08/09/2014  . Left-sided weakness 06/14/2014  . Chronic joint pain 06/14/2014    Past Surgical History:  Procedure Laterality Date  . FOOT SURGERY    . PACEMAKER PLACEMENT         Family History  Problem Relation Age of Onset  . Heart failure Mother   . Hypertension Mother   . Stroke Mother     Social History   Tobacco Use  . Smoking status: Current Every Day Smoker    Types: Cigarettes  Substance Use Topics  . Alcohol use: No  . Drug use: Yes    Types: Cocaine, Marijuana,  Methamphetamines    Home Medications Prior to Admission medications   Medication Sig Start Date End Date Taking? Authorizing Provider  chlordiazePOXIDE (LIBRIUM) 25 MG capsule Take 1 capsule (25 mg total) by mouth 4 (four) times daily. Patient not taking: Reported on 05/10/2020 08/10/14   Jimmy Footman, MD  chlordiazePOXIDE (LIBRIUM) 25 MG capsule Take 1 capsule (25 mg total) by mouth 3 (three) times daily. Patient not taking: Reported on 05/10/2020 08/10/14   Jimmy Footman, MD  clonazePAM (KLONOPIN) 1 MG tablet Take 1 mg by mouth 4 (four) times daily.    [provider]  diphenhydrAMINE (BENADRYL) 25 MG tablet Take 25 mg by mouth in the morning and at bedtime.    [provider]  fluticasone (FLONASE) 50 MCG/ACT nasal spray Place 2 sprays into both nostrils 2 (two) times daily as needed for allergies or rhinitis. 12/21/19   [provider]  gabapentin (NEURONTIN) 400 MG capsule Take 1 capsule (400 mg total) by mouth 3 (three) times daily. Patient not taking: Reported on 05/10/2020 08/10/14   Jimmy Footman, MD  meloxicam (MOBIC) 15 MG tablet Take 1 tablet (15 mg total) by mouth daily. Patient not taking: Reported on 05/10/2020 08/10/14   Jimmy Footman, MD  nicotine (NICODERM CQ - DOSED IN MG/24 HOURS) 21 mg/24hr patch Place 1 patch (21 mg total) onto  the skin daily. Patient not taking: Reported on 05/10/2020 08/10/14   Jimmy Footman, MD  Oxycodone HCl 10 MG TABS Take 10 mg by mouth 3 (three) times daily as needed (for pain).    [provider]    Allergies    Vicodin [hydrocodone-acetaminophen]  Review of Systems   Review of Systems  Constitutional: Negative for chills and fever.  HENT: Negative for ear pain and sore throat.   Eyes: Negative for pain and visual disturbance.  Respiratory: Positive for shortness of breath. Negative for cough.   Cardiovascular: Positive for chest pain. Negative for  palpitations.  Gastrointestinal: Negative for abdominal pain and vomiting.  Genitourinary: Negative for dysuria and hematuria.  Musculoskeletal: Negative for arthralgias and back pain.  Skin: Negative for color change and rash.  Neurological: Negative for seizures and syncope.  All other systems reviewed and are negative.   Physical Exam Updated Vital Signs BP 110/63 (BP Location: Right Arm)   Pulse 78   Temp 98.1 F (36.7 C)   Resp 14   SpO2 97%   Physical Exam Vitals and nursing note reviewed.  Constitutional:      Appearance: He is well-developed.  HENT:     Head: Normocephalic and atraumatic.  Eyes:     Conjunctiva/sclera: Conjunctivae normal.  Cardiovascular:     Rate and Rhythm: Normal rate and regular rhythm.     Heart sounds: No murmur heard.   Pulmonary:     Effort: Pulmonary effort is normal. No respiratory distress.     Breath sounds: Normal breath sounds.  Abdominal:     Palpations: Abdomen is soft.     Tenderness: There is no abdominal tenderness.  Musculoskeletal:     Cervical back: Neck supple.  Skin:    General: Skin is warm and dry.  Neurological:     Mental Status: He is alert.     ED Results / Procedures / Treatments   Labs (all labs ordered are listed, but only abnormal results are displayed) Labs Reviewed  CBC - Abnormal; Notable for the following components:      Result Value   Platelets 420 (*)    All other components within normal limits  BASIC METABOLIC PANEL  TROPONIN I (HIGH SENSITIVITY)  TROPONIN I (HIGH SENSITIVITY)    EKG None  Radiology DG Chest 2 View  Result Date: 06/21/2020 CLINICAL DATA:  55 year old male with chest pain. EXAM: CHEST - 2 VIEW COMPARISON:  Chest radiograph dated 05/10/2020. FINDINGS: No focal consolidation, pleural effusion, pneumothorax. Mild interstitial prominence involving the mid to lower lung field, likely chronic. The cardiac silhouette is within limits. No acute osseous pathology. IMPRESSION: No  active cardiopulmonary disease. Electronically Signed   By: Elgie Collard M.D.   On: 06/21/2020 17:18   ED Course  I have reviewed the triage vital signs and the nursing notes.  Pertinent labs & imaging results that were available during my care of the patient were reviewed by me and considered in my medical decision making (see chart for details).    MDM Rules/Calculators/A&P                          Medical Decision Making:  [name] [name] is a 55 y.o. male who presented to the ED today with chest pain.  Patient's pain started 1 day ago.  Based on patient's comorbidities, patient falls outside HEART pathway.  On my initial exam, the pt was in no acute distress.  Vital signs notable for no acute abnormalities. Reviewed and confirmed nursing documentation for past medical history, family history, social history.   Initial Assessment:  With the patient's presentation of left-sided chest pain, most likely diagnose is musculoskeletal chest pain versus ACS. Other diagnoses were considered including (but not limited to) pulmonary embolism, community-acquired pneumonia, aortic dissection, pneumothorax, underlying bony abnormality, anemia. These are considered less likely due to history of present illness and physical exam findings.  In particular, concerning pulmonary embolism: Patient is PERC positive and the they deny malignancy, hormone usage, history of DVT,  or calf tenderness leading to a low risk Wells score.  Initial Plan: 1. Evaluate for ACS with delta troponin and EKG evaluated as below 2. Evaluate for dissection, bony abnormality, or pneumonia with chest x-ray and screening laboratory evaluation including CBC, BMP 3. Further evaluation for pulmonary embolism not indicated at this time based on patient's PERC and Wells score.    Initial Study Results:  EKG was reviewed independently and under the supervision of the attending provider. Rate, rhythm, axis, intervals all examined and  without medically relevant abnormality. ST segments without concerns for elevations. Examination for cardiogenic sources of syncope completed without delta waves indicative of WPW, without Brugada wave forms, without evidence of HOCM including lack of sharp Q waves or obvious hypertrophy, without evidence of prolonged QT syndrome, and no epsilon waves indicative of arhythmogenic right ventricular dysplasia  Laboratory . Delta troponin demonstrated stability . CBC and BMP without obvious metabolic or inflammatory abnormalities requiring further evaluation  Radiology DG Chest 2 View  Final Result      Final Assessment and Plan:  Patient's objective evaluation without etiology of his symptoms at this time. Given the reassuring exam, discussed inpatient v outpatient evaluation options. Patient requesting discharge at this time as he feels he has been here too long already.   Disposition: Based on the above findings, I believe patient is stable for discharge.   Patient/family educated about specific return precautions for given chief complaint and symptoms.  Patient/family educated about follow-up with PCP.  Patient/family expressed understanding of return precautions and need for follow-up. Patient spoken to regarding all imaging and laboratory results and appropriate follow up for these results. All education provided in verbal form with additional information in written form. Time was allowed for answering of patient questions. Patient discharged.   Emergency Department Medication Summary: Medications - No data to display   Final Clinical Impression(s) / ED Diagnoses Final diagnoses:  None    Rx / DC Orders ED Discharge Orders    None       Glyn Ade, MD 06/22/20 [id number]    Derwood Kaplan, MD 06/23/20 1623

## 2020-06-21 NOTE — ED Notes (Signed)
Pt took monitor off and turned off machine. Frustrated; in pain, wants something to eat and drink. Threatening to leave. RN aware

## 2020-06-21 NOTE — ED Triage Notes (Signed)
Pt reports l sided chest pain with radiation down arm, headache, shob, and blurry vision in L eye x 2 days. Hx MI ten years ago.

## 2020-06-21 NOTE — Discharge Instructions (Addendum)
You were seen today for chest pain. Your workup today does not have an etiology for your pain. It is important for you to continue working with your PCP and cardiologist to find the cause for your pain. Please return for any changes in your symptoms or if unable to complete follow up.

## 2020-06-21 NOTE — ED Notes (Signed)
Pt called out over call bell and states, "take this damn thing out or I am going to take it out myself and you'll have to clean up the blood." Went into see patient. Pt had taken monitor off, had clothing on, and said, "whatever I need to do to get the hell out of here, we need to do it." Pt's PIV removed, catheter intact. Attempted to go over discharge instructions with pt, pt states, "ya'll can't tell me anything, that doctor said there wasn't anything wrong with my heart, fuck yall". Pt ambulatory to lobby. Discharge instructions given to pt's wife.

## 2020-07-01 ENCOUNTER — Inpatient Hospital Stay (HOSPITAL_COMMUNITY)
Admission: EM | Admit: 2020-07-01 | Discharge: 2020-07-03 | DRG: 124 | Disposition: A | Discharge status: Home / Self Care | Payer: Self-pay | Attending: Internal Medicine | Admitting: Internal Medicine

## 2020-07-01 ENCOUNTER — Emergency Department (HOSPITAL_COMMUNITY): Payer: Self-pay

## 2020-07-01 ENCOUNTER — Encounter (HOSPITAL_COMMUNITY): Payer: Self-pay | Admitting: *Deleted

## 2020-07-01 ENCOUNTER — Other Ambulatory Visit: Payer: Self-pay

## 2020-07-01 DIAGNOSIS — R0602 Shortness of breath: Secondary | ICD-10-CM

## 2020-07-01 DIAGNOSIS — H538 Other visual disturbances: Principal | ICD-10-CM | POA: Diagnosis present

## 2020-07-01 DIAGNOSIS — F419 Anxiety disorder, unspecified: Secondary | ICD-10-CM | POA: Diagnosis present

## 2020-07-01 DIAGNOSIS — F17210 Nicotine dependence, cigarettes, uncomplicated: Secondary | ICD-10-CM | POA: Diagnosis present

## 2020-07-01 DIAGNOSIS — I639 Cerebral infarction, unspecified: Secondary | ICD-10-CM

## 2020-07-01 DIAGNOSIS — J189 Pneumonia, unspecified organism: Secondary | ICD-10-CM | POA: Diagnosis present

## 2020-07-01 DIAGNOSIS — R531 Weakness: Secondary | ICD-10-CM | POA: Diagnosis present

## 2020-07-01 DIAGNOSIS — Z8249 Family history of ischemic heart disease and other diseases of the circulatory system: Secondary | ICD-10-CM

## 2020-07-01 DIAGNOSIS — I252 Old myocardial infarction: Secondary | ICD-10-CM

## 2020-07-01 DIAGNOSIS — Z823 Family history of stroke: Secondary | ICD-10-CM

## 2020-07-01 DIAGNOSIS — F319 Bipolar disorder, unspecified: Secondary | ICD-10-CM | POA: Diagnosis present

## 2020-07-01 DIAGNOSIS — R29810 Facial weakness: Secondary | ICD-10-CM | POA: Diagnosis present

## 2020-07-01 DIAGNOSIS — R519 Headache, unspecified: Secondary | ICD-10-CM | POA: Diagnosis present

## 2020-07-01 DIAGNOSIS — G8929 Other chronic pain: Secondary | ICD-10-CM | POA: Diagnosis present

## 2020-07-01 DIAGNOSIS — Z20822 Contact with and (suspected) exposure to covid-19: Secondary | ICD-10-CM | POA: Diagnosis present

## 2020-07-01 DIAGNOSIS — Z720 Tobacco use: Secondary | ICD-10-CM | POA: Diagnosis present

## 2020-07-01 LAB — URINALYSIS, ROUTINE W REFLEX MICROSCOPIC
Bacteria, UA: NONE SEEN
Bilirubin Urine: NEGATIVE
Glucose, UA: NEGATIVE mg/dL
Ketones, ur: NEGATIVE mg/dL
Leukocytes,Ua: NEGATIVE
Nitrite: NEGATIVE
Protein, ur: NEGATIVE mg/dL
Specific Gravity, Urine: 1.003 — ABNORMAL LOW (ref 1.005–1.030)
pH: 6 (ref 5.0–8.0)

## 2020-07-01 LAB — RAPID URINE DRUG SCREEN, HOSP PERFORMED
Amphetamines: NOT DETECTED
Barbiturates: NOT DETECTED
Benzodiazepines: NOT DETECTED
Cocaine: NOT DETECTED
Opiates: NOT DETECTED
Tetrahydrocannabinol: POSITIVE — AB

## 2020-07-01 LAB — COMPREHENSIVE METABOLIC PANEL
ALT: 25 U/L (ref 0–44)
AST: 22 U/L (ref 15–41)
Albumin: 4.3 g/dL (ref 3.5–5.0)
Alkaline Phosphatase: 79 U/L (ref 38–126)
Anion gap: 7 (ref 5–15)
BUN: 17 mg/dL (ref 6–20)
CO2: 27 mmol/L (ref 22–32)
Calcium: 9 mg/dL (ref 8.9–10.3)
Chloride: 100 mmol/L (ref 98–111)
Creatinine, Ser: 1.06 mg/dL (ref 0.61–1.24)
GFR, Estimated: >60 mL/min (ref >60)
Glucose, Bld: 96 mg/dL (ref 70–99)
Potassium: 4 mmol/L (ref 3.5–5.1)
Sodium: 134 mmol/L — ABNORMAL LOW (ref 135–145)
Total Bilirubin: 0.4 mg/dL (ref 0.3–1.2)
Total Protein: 7.4 g/dL (ref 6.5–8.1)

## 2020-07-01 LAB — CBC
HCT: 46.1 % (ref 39.0–52.0)
Hemoglobin: 14.9 g/dL (ref 13.0–17.0)
MCH: 29.1 pg (ref 26.0–34.0)
MCHC: 32.3 g/dL (ref 30.0–36.0)
MCV: 90 fL (ref 80.0–100.0)
Platelets: 343 K/uL (ref 150–400)
RBC: 5.12 MIL/uL (ref 4.22–5.81)
RDW: 13.2 % (ref 11.5–15.5)
WBC: 11.2 K/uL — ABNORMAL HIGH (ref 4.0–10.5)
nRBC: 0 % (ref 0.0–0.2)

## 2020-07-01 LAB — SEDIMENTATION RATE: Sed Rate: 3 mm/hr (ref 0–16)

## 2020-07-01 LAB — RESP PANEL BY RT-PCR (FLU A&B, COVID) ARPGX2
Influenza A by PCR: NEGATIVE
Influenza B by PCR: NEGATIVE
SARS Coronavirus 2 by RT PCR: NEGATIVE

## 2020-07-01 LAB — DIFFERENTIAL
Abs Immature Granulocytes: 0.1 K/uL — ABNORMAL HIGH (ref 0.00–0.07)
Basophils Absolute: 0.1 K/uL (ref 0.0–0.1)
Basophils Relative: 1 %
Eosinophils Absolute: 0.3 K/uL (ref 0.0–0.5)
Eosinophils Relative: 3 %
Immature Granulocytes: 1 %
Lymphocytes Relative: 22 %
Lymphs Abs: 2.4 K/uL (ref 0.7–4.0)
Monocytes Absolute: 1.2 K/uL — ABNORMAL HIGH (ref 0.1–1.0)
Monocytes Relative: 10 %
Neutro Abs: 7.2 K/uL (ref 1.7–7.7)
Neutrophils Relative %: 63 %

## 2020-07-01 LAB — C-REACTIVE PROTEIN: CRP: 0.5 mg/dL (ref <1.0)

## 2020-07-01 LAB — TROPONIN I (HIGH SENSITIVITY)
Troponin I (High Sensitivity): <2 ng/L
Troponin I (High Sensitivity): 2 ng/L (ref <18)

## 2020-07-01 LAB — ETHANOL: Alcohol, Ethyl (B): <10 mg/dL (ref <10)

## 2020-07-01 LAB — PROTIME-INR
INR: 1 (ref 0.8–1.2)
Prothrombin Time: 13 seconds (ref 11.4–15.2)

## 2020-07-01 LAB — APTT: aPTT: 29 seconds (ref 24–36)

## 2020-07-01 MED ORDER — CARIPRAZINE HCL 1.5 MG PO CAPS
3.0000 mg | ORAL_CAPSULE | Freq: Every day | ORAL | Status: DC
  Administered 2020-07-02 – 2020-07-03 (×2): 3 mg via ORAL
  Filled 2020-07-01 (×2): qty 2

## 2020-07-01 MED ORDER — FLUTICASONE PROPIONATE 50 MCG/ACT NA SUSP
2.0000 | Freq: Two times a day (BID) | NASAL | Status: DC | PRN
  Filled 2020-07-01: qty 16

## 2020-07-01 MED ORDER — SODIUM CHLORIDE 0.9 % IV SOLN
500.0000 mg | INTRAVENOUS | Status: DC
  Filled 2020-07-01: qty 500

## 2020-07-01 MED ORDER — SODIUM CHLORIDE 0.9 % IV SOLN
2.0000 g | INTRAVENOUS | Status: DC
  Administered 2020-07-02: 2 g via INTRAVENOUS
  Filled 2020-07-01 (×2): qty 20

## 2020-07-01 MED ORDER — SODIUM CHLORIDE 0.9 % IV SOLN: 2.0000 g | INTRAVENOUS | Status: DC

## 2020-07-01 MED ORDER — ENOXAPARIN SODIUM 40 MG/0.4ML ~~LOC~~ SOLN
40.0000 mg | Freq: Every day | SUBCUTANEOUS | Status: DC
  Administered 2020-07-01 – 2020-07-02 (×2): 40 mg via SUBCUTANEOUS
  Filled 2020-07-01 (×2): qty 0.4

## 2020-07-01 MED ORDER — METOCLOPRAMIDE HCL 5 MG/ML IJ SOLN
10.0000 mg | Freq: Once | INTRAMUSCULAR | Status: AC
  Administered 2020-07-01: 10 mg via INTRAVENOUS
  Filled 2020-07-01: qty 2

## 2020-07-01 MED ORDER — METOCLOPRAMIDE HCL 5 MG/ML IJ SOLN: 10.0000 mg | Freq: Once | INTRAMUSCULAR | Status: DC | PRN

## 2020-07-01 MED ORDER — GUAIFENESIN-DM 100-10 MG/5ML PO SYRP: 10.0000 mL | ORAL_SOLUTION | ORAL | Status: DC | PRN

## 2020-07-01 MED ORDER — MORPHINE SULFATE (PF) 2 MG/ML IV SOLN: 2.0000 mg | Freq: Once | INTRAVENOUS | Status: DC

## 2020-07-01 MED ORDER — KETOROLAC TROMETHAMINE 15 MG/ML IJ SOLN
15.0000 mg | Freq: Once | INTRAMUSCULAR | Status: AC
  Administered 2020-07-01: 15 mg via INTRAVENOUS
  Filled 2020-07-01: qty 1

## 2020-07-01 MED ORDER — SENNOSIDES-DOCUSATE SODIUM 8.6-50 MG PO TABS: 1.0000 | ORAL_TABLET | Freq: Every evening | ORAL | Status: DC | PRN

## 2020-07-01 MED ORDER — ACETAMINOPHEN 160 MG/5ML PO SOLN: 650.0000 mg | ORAL | Status: DC | PRN

## 2020-07-01 MED ORDER — STROKE: EARLY STAGES OF RECOVERY BOOK
Freq: Once | Status: AC
Start: 2020-07-01 — End: 2020-07-01
  Filled 2020-07-01: qty 1

## 2020-07-01 MED ORDER — SODIUM CHLORIDE 0.9 % IV SOLN: 1.0000 g | INTRAVENOUS | Status: DC

## 2020-07-01 MED ORDER — KETOROLAC TROMETHAMINE 15 MG/ML IJ SOLN
15.0000 mg | Freq: Once | INTRAMUSCULAR | Status: AC | PRN
  Administered 2020-07-02: 15 mg via INTRAVENOUS
  Filled 2020-07-01: qty 1

## 2020-07-01 MED ORDER — OXYCODONE HCL 5 MG PO TABS
10.0000 mg | ORAL_TABLET | Freq: Three times a day (TID) | ORAL | Status: DC | PRN
  Administered 2020-07-02 – 2020-07-03 (×4): 10 mg via ORAL
  Filled 2020-07-01 (×4): qty 2

## 2020-07-01 MED ORDER — CLONAZEPAM 1 MG PO TABS
1.0000 mg | ORAL_TABLET | Freq: Two times a day (BID) | ORAL | Status: DC | PRN
  Administered 2020-07-01 – 2020-07-02 (×2): 1 mg via ORAL
  Filled 2020-07-01 (×2): qty 1

## 2020-07-01 MED ORDER — IOHEXOL 350 MG/ML SOLN
150.0000 mL | Freq: Once | INTRAVENOUS | Status: AC | PRN
  Administered 2020-07-01: 150 mL via INTRAVENOUS

## 2020-07-01 MED ORDER — ACETAMINOPHEN 325 MG PO TABS: 650.0000 mg | ORAL_TABLET | ORAL | Status: DC | PRN

## 2020-07-01 MED ORDER — SODIUM CHLORIDE 0.9 % IV BOLUS
1000.0000 mL | Freq: Once | INTRAVENOUS | Status: AC
  Administered 2020-07-01: 1000 mL via INTRAVENOUS

## 2020-07-01 MED ORDER — FENTANYL CITRATE (PF) 100 MCG/2ML IJ SOLN
50.0000 ug | Freq: Once | INTRAMUSCULAR | Status: AC
  Administered 2020-07-01: 50 ug via INTRAVENOUS
  Filled 2020-07-01: qty 2

## 2020-07-01 MED ORDER — DIPHENHYDRAMINE HCL 50 MG/ML IJ SOLN
25.0000 mg | Freq: Once | INTRAMUSCULAR | Status: AC | PRN
  Administered 2020-07-02: 25 mg via INTRAVENOUS
  Filled 2020-07-01: qty 1

## 2020-07-01 MED ORDER — OXYCODONE HCL 5 MG PO TABS
10.0000 mg | ORAL_TABLET | Freq: Once | ORAL | Status: AC
  Administered 2020-07-01: 10 mg via ORAL
  Filled 2020-07-01: qty 2

## 2020-07-01 MED ORDER — ASPIRIN 81 MG PO CHEW
81.0000 mg | CHEWABLE_TABLET | Freq: Every day | ORAL | Status: DC
  Administered 2020-07-02 – 2020-07-03 (×2): 81 mg via ORAL
  Filled 2020-07-01 (×2): qty 1

## 2020-07-01 MED ORDER — ACETAMINOPHEN 650 MG RE SUPP: 650.0000 mg | RECTAL | Status: DC | PRN

## 2020-07-01 MED ORDER — DEXAMETHASONE SODIUM PHOSPHATE 10 MG/ML IJ SOLN
10.0000 mg | Freq: Once | INTRAMUSCULAR | Status: AC
  Administered 2020-07-01: 10 mg via INTRAVENOUS
  Filled 2020-07-01: qty 1

## 2020-07-01 MED ORDER — TETRACAINE HCL 0.5 % OP SOLN
2.0000 [drp] | Freq: Once | OPHTHALMIC | Status: AC
  Administered 2020-07-01: 2 [drp] via OPHTHALMIC
  Filled 2020-07-01: qty 4

## 2020-07-01 MED ORDER — FLUORESCEIN SODIUM 1 MG OP STRP: 1.0000 | ORAL_STRIP | Freq: Once | OPHTHALMIC | Status: DC

## 2020-07-01 NOTE — ED Notes (Signed)
Report to Gabby, RN.

## 2020-07-01 NOTE — ED Notes (Signed)
Pt continues to yell and cuss at staff. Informed pt that [facility] was on the way to take him to [facility] right now.

## 2020-07-01 NOTE — ED Notes (Signed)
Patient came to desk asking for food. Patient asked to go back to room. Patient yelling at staff.

## 2020-07-01 NOTE — ED Triage Notes (Signed)
Pt with HA x 2 weeks and left eye blurry vision for past 2 days. CP earlier today.

## 2020-07-01 NOTE — ED Notes (Addendum)
Beeped and Called CODE STROKE @ 1302 Cancel Code STROKE

## 2020-07-01 NOTE — ED Notes (Addendum)
Patient called out asking for food and medication. Patient made aware that staff was in an emergency at this time. Patient states that was not his problem. " I hope who ever it is fucking chokes". Charge made aware.

## 2020-07-01 NOTE — ED Notes (Signed)
Patient called out asking for medication. Patient yelling at staff that he has been here all day. Patient states " I need my fucking meds". I been asking since 545pm. Made Charge aware of patient's behavior.

## 2020-07-01 NOTE — H&P (Addendum)
History and Physical    [name] [name]:[id number] DOB: 1965-07-16 DOA: 07/01/2020  PCP: [facility] [place]   Patient coming from: Home  I have personally briefly reviewed patient's old medical records in [place]  Chief Complaint: Headache, Blurry vision left eye  HPI: [name] [name] is a 55 y.o. male with medical history significant for amphetamine, cannabis, opiate use, depression, tobacco use, bipolar disorder. Patient presented to the ED with complaints of left-sided headache of 2 weeks duration, headaches were initially intermittent, but today his headache is constant.  He reports his headache starts and resolves spontaneously.  He reports associated nausea, coldness of his feet, abdominal upset and now left eye vision change with the headaches.  He reports a history of headache related to a sinus infection which improved with Benadryl.  With his headache this time has not resolved with Benadryl ibuprofen, Tylenol, home oxycodone. Reports blurry vision involving his left eye over the past 2 days, but these occur with the headaches.  At the time of my evaluation, after dexamethasone, fentanyl, ketorolac, Reglan given in the ED, his headache has improved/is less intense, but not resolved, and his vision also has improved.  He reports poor grip strength in his left hand in the past 2 weeks.  Denies abnormal sensation/numbness.  He denies change in speech , he has not noticed any facial asymmetry until he was in the ED.  Reports some dizziness also. Patient also reports difficulty breathing with activity over the past week, and a cough.    No history of stroke.  He reports a history of myocardial infarction.  Smokes half a pack of cigarettes daily.  ED Course: Blood pressure systolic 81-191, otherwise stable vitals.  WBC 11.2.  Troponin less than 2.  UDS positive for cannabis.   Patient was in the ED for chest pain 4/7 which he said had persisted with back pain, CTA chest was  done -no PE, patchy airspace opacities infectious or inflammatory process.    Head CT, CT head and neck without acute abnormality. EDP talked to neurologist Dr. Milas Gain, recommended admission to [facility] for MRI tonight.  Review of Systems: As per HPI all other systems reviewed and negative.  Past Medical History:  Diagnosis Date  . Arthritis   . Back pain   . Bipolar disorder ([place])   . MI (myocardial infarction) ([place])    at age 83  . Polysubstance abuse ([place])   . TIA (transient ischemic attack)   . Tobacco abuse     Past Surgical History:  Procedure Laterality Date  . FOOT SURGERY    . PACEMAKER PLACEMENT       reports that he has been smoking cigarettes. He has never used smokeless tobacco. He reports current drug use. Drugs: Cocaine, Marijuana, and Methamphetamines. He reports that he does not drink alcohol.  Allergies  Allergen Reactions  . Vicodin [Hydrocodone-Acetaminophen] Nausea And Vomiting    Family History  Problem Relation Age of Onset  . Heart failure Mother   . Hypertension Mother   . Stroke Mother     Prior to Admission medications   Medication Sig Start Date End Date Taking? Authorizing Provider  albuterol (VENTOLIN HFA) 108 (90 Base) MCG/ACT inhaler Inhale into the lungs. 06/14/20 06/14/21 Yes [provider]  chlordiazePOXIDE (LIBRIUM) 25 MG capsule Take 1 capsule (25 mg total) by mouth 4 (four) times daily. Patient not taking: Reported on 05/10/2020 08/10/14   Hildred Priest, MD  chlordiazePOXIDE (LIBRIUM) 25 MG  capsule Take 1 capsule (25 mg total) by mouth 3 (three) times daily. Patient not taking: Reported on 05/10/2020 08/10/14   Hildred Priest, MD  clonazePAM (KLONOPIN) 1 MG tablet Take 1 mg by mouth 4 (four) times daily.    [provider]  diphenhydrAMINE (BENADRYL) 25 MG tablet Take 25 mg by mouth in the morning and at bedtime.    [provider]  fluticasone (FLONASE) 50 MCG/ACT nasal spray Place 2  sprays into both nostrils 2 (two) times daily as needed for allergies or rhinitis. 12/21/19   [provider]  gabapentin (NEURONTIN) 400 MG capsule Take 1 capsule (400 mg total) by mouth 3 (three) times daily. Patient not taking: Reported on 05/10/2020 08/10/14   Hildred Priest, MD  meloxicam (MOBIC) 15 MG tablet Take 1 tablet (15 mg total) by mouth daily. Patient not taking: Reported on 05/10/2020 08/10/14   Hildred Priest, MD  nicotine (NICODERM CQ - DOSED IN MG/24 HOURS) 21 mg/24hr patch Place 1 patch (21 mg total) onto the skin daily. Patient not taking: Reported on 05/10/2020 08/10/14   Hildred Priest, MD  Oxycodone HCl 10 MG TABS Take 10 mg by mouth 3 (three) times daily as needed (for pain).    [provider]    Physical Exam: Vitals:   07/01/20 1159 07/01/20 1345 07/01/20 1400 07/01/20 1415  BP:  104/68 103/62 103/73  Pulse: 64 (!) 49 (!) 46 64  Resp:  (!) 23 16 19   SpO2: 100% 93% 97% 96%  Weight:      Height:        Constitutional: NAD, calm, comfortable Vitals:   07/01/20 1159 07/01/20 1345 07/01/20 1400 07/01/20 1415  BP:  104/68 103/62 103/73  Pulse: 64 (!) 49 (!) 46 64  Resp:  (!) 23 16 19   SpO2: 100% 93% 97% 96%  Weight:      Height:       Eyes: PERRL, lids and conjunctivae normal ENMT: Mucous membranes are moist.  Neck: normal, supple, no masses, no thyromegaly Respiratory: clear to auscultation bilaterally, no wheezing, no crackles. Normal respiratory effort. No accessory muscle use.  Cardiovascular: Regular rate and rhythm, no murmurs / rubs / gallops. No extremity edema. 2+ pedal pulses.  Abdomen: no tenderness, no masses palpated. No hepatosplenomegaly. Bowel sounds positive.  Musculoskeletal: no clubbing / cyanosis. No joint deformity upper and lower extremities. Good ROM, no contractures. Normal muscle tone.  Skin: no rashes, lesions, ulcers. No induration Neurologic:  Neurological:     Mental Status: She  is alert.     GCS: GCS eye subscore is 4. GCS verbal subscore is 5. GCS motor subscore is 6.     Comments: Mental Status:  Alert, oriented, thought content appropriate, able to give a coherent history. Speech fluent without evidence of aphasia. Able to follow 2 step commands without difficulty.  Cranial Nerves:  II:  Peripheral visual fields grossly normal, pupils equal, round, reactive to light.  Able to finger count with left eye. III,IV, VI: ptosis not present, extra-ocular motions intact bilaterally  V,VII: smile assymmetric, slight flattening of left nasolabial fold, eyebrows raise symmetric VIII: hearing grossly normal to voice  X: uvula elevates symmetrically  XI: bilateral shoulder shrug symmetric and strong XII: midline tongue extension without fassiculations Motor:  Normal tone.  5/5 strength bilateral upper extremities.  4+ /5 strength in lower extremities bilaterally  Sensory: Sensation intact to light touch in all extremities.  CV: distal pulses palpable throughout    Psychiatric: Normal judgment  and insight. Alert and oriented x 3. Normal mood.   Labs on Admission: I have personally reviewed following labs and imaging studies  CBC: Recent Labs  Lab 07/01/20 1317  WBC 11.2*  NEUTROABS 7.2  HGB 14.9  HCT 46.1  MCV 90.0  PLT 973   Basic Metabolic Panel: Recent Labs  Lab 07/01/20 1317  NA 134*  K 4.0  CL 100  CO2 27  GLUCOSE 96  BUN 17  CREATININE 1.06  CALCIUM 9.0   GFR: Estimated Creatinine Clearance: 92.1 mL/min (by C-G formula based on SCr of 1.06 mg/dL). Liver Function Tests: Recent Labs  Lab 07/01/20 1317  AST 22  ALT 25  ALKPHOS 79  BILITOT 0.4  PROT 7.4  ALBUMIN 4.3   Coagulation Profile: Recent Labs  Lab 07/01/20 1317  INR 1.0   Urine analysis:    Component Value Date/Time   COLORURINE COLORLESS (A) 07/01/2020 1357   APPEARANCEUR CLEAR 07/01/2020 1357   APPEARANCEUR Clear 06/21/2014 1737   LABSPEC 1.003 (L) 07/01/2020 1357    LABSPEC 1.021 06/21/2014 1737   PHURINE 6.0 07/01/2020 1357   GLUCOSEU NEGATIVE 07/01/2020 1357   GLUCOSEU Negative 06/21/2014 1737   HGBUR SMALL (A) 07/01/2020 1357   BILIRUBINUR NEGATIVE 07/01/2020 1357   BILIRUBINUR Negative 06/21/2014 1737   KETONESUR NEGATIVE 07/01/2020 1357   PROTEINUR NEGATIVE 07/01/2020 1357   UROBILINOGEN 0.2 06/14/2014 1413   NITRITE NEGATIVE 07/01/2020 1357   LEUKOCYTESUR NEGATIVE 07/01/2020 1357   LEUKOCYTESUR Negative 06/21/2014 1737    Radiological Exams on Admission: CT Angio Head W or Wo Contrast  Result Date: 07/01/2020 CLINICAL DATA:  Neuro deficit, acute, stroke suspected. Additional history provided: Patient reports headache for 2 weeks and left eye blurry vision for the past 2 days, mid chest pain, pain between shoulder blades. EXAM: CT ANGIOGRAPHY HEAD AND NECK TECHNIQUE: Multidetector CT imaging of the head and neck was performed using the standard protocol during bolus administration of intravenous contrast. Multiplanar CT image reconstructions and MIPs were obtained to evaluate the vascular anatomy. Carotid stenosis measurements (when applicable) are obtained utilizing NASCET criteria, using the distal internal carotid diameter as the denominator. CONTRAST:  121m OMNIPAQUE IOHEXOL 350 MG/ML SOLN COMPARISON:  Noncontrast head CT 07/01/2020. Brain MRI 06/15/2014. CT angiogram head/neck 06/14/2014. FINDINGS: CTA NECK FINDINGS Aortic arch: Standard aortic branching. Atherosclerotic plaque within the visualized aortic arch and proximal major branch vessels of the neck. No hemodynamically significant innominate or proximal subclavian artery stenosis. Right carotid system: CCA and ICA patent within the neck without stenosis. No significant atherosclerotic disease. Left carotid system: CCA and ICA patent within the neck without stenosis. Minimal atherosclerotic plaque within the carotid bifurcation. Vertebral arteries: Codominant and patent within the neck without  stenosis Skeleton: No acute bony abnormality or aggressive osseous lesion. Cervical spondylosis. Other neck: No neck mass or cervical lymphadenopathy. Upper chest: Reported separately ground-glass opacity within the right lung apex. Paraseptal emphysema. Review of the MIP images confirms the above findings CTA HEAD FINDINGS Anterior circulation: The intracranial internal carotid arteries are patent. The M1 middle cerebral arteries are patent. No M2 proximal branch occlusion. Atherosclerotic irregularity of the M2 and more distal middle cerebral artery branches bilaterally. Most notably, there is a severe stenosis within a distal M2 right middle cerebral artery vessel (series 11, image 16). The anterior cerebral arteries are patent. No intracranial aneurysm is identified. Posterior circulation: The intracranial vertebral arteries are patent. The basilar artery is patent. The posterior cerebral arteries are patent. Posterior communicating arteries are  hypoplastic or absent bilaterally. Venous sinuses: Within the limitations of contrast timing, no convincing thrombus. Anatomic variants: As described Review of the MIP images confirms the above findings IMPRESSION: CTA neck: 1. The common carotid, internal carotid and vertebral arteries are patent within the neck without stenosis. Small atherosclerotic disease within the left carotid bifurcation. 2.  Aortic Atherosclerosis (ICD10-I70.0). 3. Focus of ground-glass opacity within the imaged right lung apex. Please refer to concurrently performed and separately reported CT angiogram of the chest for further description. CTA head: 1. No intracranial large vessel occlusion or proximal high-grade arterial stenosis. 2. Atherosclerotic irregularity of the M2 and more distal middle cerebral artery branches bilaterally. 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 Electronically Signed   By: Kellie Simmering DO   On: 07/01/2020 15:00   CT HEAD  WO CONTRAST  Result Date: 07/01/2020 CLINICAL DATA:  Suspected stroke. Headache for 2 weeks. LEFT eye blurry vision for 2 days. Chest pain earlier. EXAM: CT HEAD WITHOUT CONTRAST TECHNIQUE: Contiguous axial images were obtained from the base of the skull through the vertex without intravenous contrast. COMPARISON:  06/01/2019 FINDINGS: Brain: No evidence of acute infarction, hemorrhage, hydrocephalus, extra-axial collection or mass lesion/mass effect. Vascular: No hyperdense vessel or unexpected calcification. Skull: Normal. Negative for fracture or focal lesion. Sinuses/Orbits: No acute finding. Other: None. IMPRESSION: Negative exam. Electronically Signed   By: Nolon Nations M.D.   On: 07/01/2020 13:26   CT Angio Neck W and/or Wo Contrast  Result Date: 07/01/2020 CLINICAL DATA:  Neuro deficit, acute, stroke suspected. Additional history provided: Patient reports headache for 2 weeks and left eye blurry vision for the past 2 days, mid chest pain, pain between shoulder blades. EXAM: CT ANGIOGRAPHY HEAD AND NECK TECHNIQUE: Multidetector CT imaging of the head and neck was performed using the standard protocol during bolus administration of intravenous contrast. Multiplanar CT image reconstructions and MIPs were obtained to evaluate the vascular anatomy. Carotid stenosis measurements (when applicable) are obtained utilizing NASCET criteria, using the distal internal carotid diameter as the denominator. CONTRAST:  120m OMNIPAQUE IOHEXOL 350 MG/ML SOLN COMPARISON:  Noncontrast head CT 07/01/2020. Brain MRI 06/15/2014. CT angiogram head/neck 06/14/2014. FINDINGS: CTA NECK FINDINGS Aortic arch: Standard aortic branching. Atherosclerotic plaque within the visualized aortic arch and proximal major branch vessels of the neck. No hemodynamically significant innominate or proximal subclavian artery stenosis. Right carotid system: CCA and ICA patent within the neck without stenosis. No significant atherosclerotic  disease. Left carotid system: CCA and ICA patent within the neck without stenosis. Minimal atherosclerotic plaque within the carotid bifurcation. Vertebral arteries: Codominant and patent within the neck without stenosis Skeleton: No acute bony abnormality or aggressive osseous lesion. Cervical spondylosis. Other neck: No neck mass or cervical lymphadenopathy. Upper chest: Reported separately ground-glass opacity within the right lung apex. Paraseptal emphysema. Review of the MIP images confirms the above findings CTA HEAD FINDINGS Anterior circulation: The intracranial internal carotid arteries are patent. The M1 middle cerebral arteries are patent. No M2 proximal branch occlusion. Atherosclerotic irregularity of the M2 and more distal middle cerebral artery branches bilaterally. Most notably, there is a severe stenosis within a distal M2 right middle cerebral artery vessel (series 11, image 16). The anterior cerebral arteries are patent. No intracranial aneurysm is identified. Posterior circulation: The intracranial vertebral arteries are patent. The basilar artery is patent. The posterior cerebral arteries are patent. Posterior communicating arteries are hypoplastic or absent bilaterally. Venous sinuses: Within the limitations of  contrast timing, no convincing thrombus. Anatomic variants: As described Review of the MIP images confirms the above findings IMPRESSION: CTA neck: 1. The common carotid, internal carotid and vertebral arteries are patent within the neck without stenosis. Small atherosclerotic disease within the left carotid bifurcation. 2.  Aortic Atherosclerosis (ICD10-I70.0). 3. Focus of ground-glass opacity within the imaged right lung apex. Please refer to concurrently performed and separately reported CT angiogram of the chest for further description. CTA head: 1. No intracranial large vessel occlusion or proximal high-grade arterial stenosis. 2. Atherosclerotic irregularity of the M2 and more  distal middle cerebral artery branches bilaterally. 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 Electronically Signed   By: Kellie Simmering DO   On: 07/01/2020 15:00    EKG: Independently reviewed.  Sinus rhythm rate 60.  QTc 389.  No significant change from prior.  Assessment/Plan Principal Problem:   Blurry vision, left eye Active Problems:   Tobacco abuse  Blurred vision left eye, headache- subjective reduced left hand grip strenght, also slight left facial droop.  At this time he reports his vision has improved.  Head CT, CTA head and neck, without acute abnormality, patent vessels without stenosis. - EDP talked to Dr. Milas Gain, neurologist, recommended admission to [facility] for MRI.  Neurology has been reconsulted on patient's arrival to [facility]. -MRI brain without contrast -Echocardiogram -PT, OT evaluation -Continue Aspirin 81 mg daily -Lipid panel, hemoglobin A1c in a.m. -Benadryl, Toradol and Reglan X 1 PRN for headache - Obtain ESR, CRP, if brain MRI negative, will need to consider temporal arteritis as a differential.   Pneumonia-reports cough and dyspnea with exertion, among multiple other complaints.  Afebrile, WBC 11.2.  Rules out for sepsis.  CTA chest-patchy airspace opacities consistent with infectious/inflammatory process. -Will treat as pneumonia with ceftriaxone and azithromycin -Mucolytics as needed -Follow-up chest CT in 3 to 6 months.  Substance use disorder, tobacco abuse-smokes half a pack of cigarettes daily.  History of amphetamine, cannabis, opiate use.  UDS positive for cannabis.  Chronic pain, -Resume home oxycodone as needed  Bipolar disorder, anxiety -Resume home Cariprazine, clonazepam 1 mg - he reports he takes twice daily as needed   DVT prophylaxis: Lovenox Code Status: Full code Family Communication: None at bedside Disposition Plan: ~ 1 - 2 days Consults called: Neurology Admission status:  Obs,  Tele   Bethena Roys MD [facility]  07/01/2020, 9:43 PM

## 2020-07-01 NOTE — ED Notes (Signed)
Pt was given a meal at this time and was explained that it was very hot to let it cool before eating still rude to staff requesting pain meds for his head at this time rn aware Tylon Kemmerling

## 2020-07-01 NOTE — ED Notes (Signed)
Patient called stating that since he was not going to get any medication that someone needed to come take this damn port (IV) out my arm. Made patient aware that [facility] was here to transport him to [facility].

## 2020-07-01 NOTE — ED Provider Notes (Signed)
[facility] EMERGENCY DEPARTMENT Provider Note   CSN: [id number] Arrival date & time: 07/01/20  1154     History Chief Complaint  Patient presents with  . Headache    [name] [name] is a 55 y.o. male.  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  Patient reports that he has been having a headache for the last 2 weeks with left-sided blurry vision that he is not entirely sure when it started.  Patient's headache is left-sided, described as throbbing and severe pain.  Pain is also behind his left eye.  He has blurry vision through his left eye.    Patient also report chest pain.  He was seen in the ER 10 days ago for chest pain.  He reports that he continues to have intermittent episodes of left-sided chest pain radiating to the back.  He has known history of CAD.  Past Medical History:  Diagnosis Date  . Arthritis   . Back pain   . Bipolar disorder (HCC)   . MI (myocardial infarction) (HCC)    at age 12  . Polysubstance abuse (HCC)   . TIA (transient ischemic attack)   . Tobacco abuse     Patient Active Problem List   Diagnosis Date Noted  . Blurry vision, left eye 07/01/2020  . Precordial pain 05/10/2020  . Exertional dyspnea   . 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 Cannabis use disorder, severe, dependence (HCC) 08/09/2014  . Opioid use disorder, severe, dependence (HCC) 08/09/2014  . Stimulant use disorder (cocaine use disorder) 08/09/2014  . Tobacco abuse 08/09/2014  . Antisocial personality disorder (HCC) 08/09/2014  . Left-sided weakness 06/14/2014  . Chronic joint pain 06/14/2014    Past Surgical History:  Procedure Laterality Date  . FOOT SURGERY    . PACEMAKER PLACEMENT         Family History   Problem Relation Age of Onset  . Heart failure Mother   . Hypertension Mother   . Stroke Mother     Social History   Tobacco Use  . Smoking status: Current Every Day Smoker    Types: Cigarettes  . Smokeless tobacco: Never Used  Substance Use Topics  . Alcohol use: No  . Drug use: Yes    Types: Cocaine, Marijuana, Methamphetamines    Home Medications Prior to Admission medications   Medication Sig Start Date End Date Taking? Authorizing Provider  albuterol (VENTOLIN HFA) 108 (90 Base) MCG/ACT inhaler Inhale into the lungs. 06/14/20 06/14/21 Yes [provider]  chlordiazePOXIDE (LIBRIUM) 25 MG capsule Take 1 capsule (25 mg total) by mouth 4 (four) times daily. Patient not taking: Reported on 05/10/2020 08/10/14   Jimmy Footman, MD  chlordiazePOXIDE (LIBRIUM) 25 MG capsule Take 1 capsule (25 mg total) by mouth 3 (three) times daily. Patient not taking: Reported on 05/10/2020 08/10/14   Jimmy Footman, MD  clonazePAM (KLONOPIN) 1 MG tablet Take 1 mg by mouth 4 (four) times daily.    [provider]  diphenhydrAMINE (BENADRYL) 25 MG tablet Take 25 mg by mouth in the morning and at bedtime.    [provider]  fluticasone (FLONASE) 50 MCG/ACT nasal spray Place 2 sprays into both nostrils 2 (two) times daily as needed for allergies or rhinitis. 12/21/19  [provider]  gabapentin (NEURONTIN) 400 MG capsule Take 1 capsule (400 mg total) by mouth 3 (three) times daily. Patient not taking: Reported on 05/10/2020 08/10/14   Jimmy Footman, MD  meloxicam (MOBIC) 15 MG tablet Take 1 tablet (15 mg total) by mouth daily. Patient not taking: Reported on 05/10/2020 08/10/14   Jimmy Footman, MD  nicotine (NICODERM CQ - DOSED IN MG/24 HOURS) 21 mg/24hr patch Place 1 patch (21 mg total) onto the skin daily. Patient not taking: Reported on 05/10/2020 08/10/14   Jimmy Footman, MD  Oxycodone HCl 10 MG TABS Take 10  mg by mouth 3 (three) times daily as needed (for pain).    [provider]    Allergies    Vicodin [hydrocodone-acetaminophen]  Review of Systems   Review of Systems  Constitutional: Positive for activity change.  Eyes: Positive for visual disturbance. Negative for photophobia and redness.  Cardiovascular: Positive for chest pain.  Neurological: Positive for weakness, numbness and headaches.  All other systems reviewed and are negative.   Physical Exam Updated Vital Signs BP 103/73   Pulse 64   Resp 19   Ht 6\' 1"  (1.854 m)   Wt 90.7 kg   SpO2 96%   BMI 26.39 kg/m   Physical Exam Vitals reviewed.  Constitutional:      Appearance: He is well-developed.  HENT:     Head: Normocephalic and atraumatic.  Eyes:     General: Visual field deficit present.     Pupils: Pupils are equal, round, and reactive to light.     Comments: No nystagmus, no ophthalmoplegia. No RAPD and no photophobia. Sclerae is clear  Ocular pressures were 22, 23 and 27 Fluorescein dye under woods lamp exam did not reveal any corneal abrasion  Neck:     Vascular: No JVD.  Cardiovascular:     Rate and Rhythm: Normal rate and regular rhythm.  Pulmonary:     Effort: Pulmonary effort is normal. No respiratory distress.     Breath sounds: Normal breath sounds. No wheezing.  Abdominal:     General: Bowel sounds are normal. There is no distension.     Palpations: Abdomen is soft.     Tenderness: There is no abdominal tenderness. There is no guarding or rebound.  Musculoskeletal:     Cervical back: Normal range of motion and neck supple.  Skin:    General: Skin is warm and dry.  Neurological:     Mental Status: He is alert and oriented to person, place, and time.     GCS: GCS eye subscore is 4. GCS verbal subscore is 5. GCS motor subscore is 6.     Cranial Nerves: No cranial nerve deficit, dysarthria or facial asymmetry.     Sensory: Sensory deficit present.     Motor: Weakness present.      Coordination: Coordination normal.     Comments: Patient has a drift over the left upper extremity.  He has hemibody sensory deficits over the left side which includes the face and upper and lower extremity.     ED Results / Procedures / Treatments   Labs (all labs ordered are listed, but only abnormal results are displayed) Labs Reviewed  CBC - Abnormal; Notable for the following components:      Result Value   WBC 11.2 (*)    All other components within normal limits  DIFFERENTIAL - Abnormal; Notable for the following components:   Monocytes Absolute 1.2 (*)    Abs Immature  Granulocytes 0.10 (*)    All other components within normal limits  COMPREHENSIVE METABOLIC PANEL - Abnormal; Notable for the following components:   Sodium 134 (*)    All other components within normal limits  RAPID URINE DRUG SCREEN, HOSP PERFORMED - Abnormal; Notable for the following components:   Tetrahydrocannabinol POSITIVE (*)    All other components within normal limits  URINALYSIS, ROUTINE W REFLEX MICROSCOPIC - Abnormal; Notable for the following components:   Color, Urine COLORLESS (*)    Specific Gravity, Urine 1.003 (*)    Hgb urine dipstick SMALL (*)    All other components within normal limits  RESP PANEL BY RT-PCR (FLU A&B, COVID) ARPGX2  ETHANOL  PROTIME-INR  APTT  TROPONIN I (HIGH SENSITIVITY)  TROPONIN I (HIGH SENSITIVITY)    EKG EKG Interpretation  Date/Time:  Sunday July 01 2020 12:01:35 EDT Ventricular Rate:  60 PR Interval:  162 QRS Duration: 82 QT Interval:  389 QTC Calculation: 389 R Axis:   86 Text Interpretation: Sinus rhythm ST elev, probable normal early repol pattern No acute changes No significant change since last tracing Confirmed by Derwood Kaplan ([id number]) on 07/01/2020 12:04:22 PM   Radiology CT Angio Head W or Wo Contrast  Result Date: 07/01/2020 CLINICAL DATA:  Neuro deficit, acute, stroke suspected. Additional history provided: Patient reports headache  for 2 weeks and left eye blurry vision for the past 2 days, mid chest pain, pain between shoulder blades. EXAM: CT ANGIOGRAPHY HEAD AND NECK TECHNIQUE: Multidetector CT imaging of the head and neck was performed using the standard protocol during bolus administration of intravenous contrast. Multiplanar CT image reconstructions and MIPs were obtained to evaluate the vascular anatomy. Carotid stenosis measurements (when applicable) are obtained utilizing NASCET criteria, using the distal internal carotid diameter as the denominator. CONTRAST:  OMNIPAQUE IOHEXOL 350 MG/ML SOLN COMPARISON:  Noncontrast head CT 07/01/2020. Brain MRI 06/15/2014. CT angiogram head/neck 06/14/2014. FINDINGS: CTA NECK FINDINGS Aortic arch: Standard aortic branching. Atherosclerotic plaque within the visualized aortic arch and proximal major branch vessels of the neck. No hemodynamically significant innominate or proximal subclavian artery stenosis. Right carotid system: CCA and ICA patent within the neck without stenosis. No significant atherosclerotic disease. Left carotid system: CCA and ICA patent within the neck without stenosis. Minimal atherosclerotic plaque within the carotid bifurcation. Vertebral arteries: Codominant and patent within the neck without stenosis Skeleton: No acute bony abnormality or aggressive osseous lesion. Cervical spondylosis. Other neck: No neck mass or cervical lymphadenopathy. Upper chest: Reported separately ground-glass opacity within the right lung apex. Paraseptal emphysema. Review of the MIP images confirms the above findings CTA HEAD FINDINGS Anterior circulation: The intracranial internal carotid arteries are patent. The M1 middle cerebral arteries are patent. No M2 proximal branch occlusion. Atherosclerotic irregularity of the M2 and more distal middle cerebral artery branches bilaterally. Most notably, there is a severe stenosis within a distal M2 right middle cerebral artery vessel (series 11,  image 16). The anterior cerebral arteries are patent. No intracranial aneurysm is identified. Posterior circulation: The intracranial vertebral arteries are patent. The basilar artery is patent. The posterior cerebral arteries are patent. Posterior communicating arteries are hypoplastic or absent bilaterally. Venous sinuses: Within the limitations of contrast timing, no convincing thrombus. Anatomic variants: As described Review of the MIP images confirms the above findings IMPRESSION: CTA neck: 1. The common carotid, internal carotid and vertebral arteries are patent within the neck without stenosis. Small atherosclerotic disease within the left carotid bifurcation. 2.  Aortic Atherosclerosis (  ICD10-I70.0). 3. Focus of ground-glass opacity within the imaged right lung apex. Please refer to concurrently performed and separately reported CT angiogram of the chest for further description. CTA head: 1. No intracranial large vessel occlusion or proximal high-grade arterial stenosis. 2. Atherosclerotic irregularity of the M2 and more distal middle cerebral artery branches bilaterally. 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 Electronically Signed   By: Jackey Loge DO   On: 07/01/2020 15:00   CT HEAD WO CONTRAST  Result Date: 07/01/2020 CLINICAL DATA:  Suspected stroke. Headache for 2 weeks. LEFT eye blurry vision for 2 days. Chest pain earlier. EXAM: CT HEAD WITHOUT CONTRAST TECHNIQUE: Contiguous axial images were obtained from the base of the skull through the vertex without intravenous contrast. COMPARISON:  06/01/2019 FINDINGS: Brain: No evidence of acute infarction, hemorrhage, hydrocephalus, extra-axial collection or mass lesion/mass effect. Vascular: No hyperdense vessel or unexpected calcification. Skull: Normal. Negative for fracture or focal lesion. Sinuses/Orbits: No acute finding. Other: None. IMPRESSION: Negative exam. Electronically Signed   By: Norva Pavlov M.D.   On: 07/01/2020 13:26   CT Angio Neck W and/or Wo Contrast  Result Date: 07/01/2020 CLINICAL DATA:  Neuro deficit, acute, stroke suspected. Additional history provided: Patient reports headache for 2 weeks and left eye blurry vision for the past 2 days, mid chest pain, pain between shoulder blades. EXAM: CT ANGIOGRAPHY HEAD AND NECK TECHNIQUE: Multidetector CT imaging of the head and neck was performed using the standard protocol during bolus administration of intravenous contrast. Multiplanar CT image reconstructions and MIPs were obtained to evaluate the vascular anatomy. Carotid stenosis measurements (when applicable) are obtained utilizing NASCET criteria, using the distal internal carotid diameter as the denominator. CONTRAST:  OMNIPAQUE IOHEXOL 350 MG/ML SOLN COMPARISON:  Noncontrast head CT 07/01/2020. Brain MRI 06/15/2014. CT angiogram head/neck 06/14/2014. FINDINGS: CTA NECK FINDINGS Aortic arch: Standard aortic branching. Atherosclerotic plaque within the visualized aortic arch and proximal major branch vessels of the neck. No hemodynamically significant innominate or proximal subclavian artery stenosis. Right carotid system: CCA and ICA patent within the neck without stenosis. No significant atherosclerotic disease. Left carotid system: CCA and ICA patent within the neck without stenosis. Minimal atherosclerotic plaque within the carotid bifurcation. Vertebral arteries: Codominant and patent within the neck without stenosis Skeleton: No acute bony abnormality or aggressive osseous lesion. Cervical spondylosis. Other neck: No neck mass or cervical lymphadenopathy. Upper chest: Reported separately ground-glass opacity within the right lung apex. Paraseptal emphysema. Review of the MIP images confirms the above findings CTA HEAD FINDINGS Anterior circulation: The intracranial internal carotid arteries are patent. The M1 middle cerebral arteries are patent. No M2 proximal branch  occlusion. Atherosclerotic irregularity of the M2 and more distal middle cerebral artery branches bilaterally. Most notably, there is a severe stenosis within a distal M2 right middle cerebral artery vessel (series 11, image 16). The anterior cerebral arteries are patent. No intracranial aneurysm is identified. Posterior circulation: The intracranial vertebral arteries are patent. The basilar artery is patent. The posterior cerebral arteries are patent. Posterior communicating arteries are hypoplastic or absent bilaterally. Venous sinuses: Within the limitations of contrast timing, no convincing thrombus. Anatomic variants: As described Review of the MIP images confirms the above findings IMPRESSION: CTA neck: 1. The common carotid, internal carotid and vertebral arteries are patent within the neck without stenosis. Small atherosclerotic disease within the left carotid bifurcation. 2.  Aortic Atherosclerosis (ICD10-I70.0). 3. Focus of ground-glass opacity within the imaged right  lung apex. Please refer to concurrently performed and separately reported CT angiogram of the chest for further description. CTA head: 1. No intracranial large vessel occlusion or proximal high-grade arterial stenosis. 2. Atherosclerotic irregularity of the M2 and more distal middle cerebral artery branches bilaterally. 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 Electronically Signed   By: Jackey LogeKyle  Golden DO   On: 07/01/2020 15:00   CT Angio Chest Aorta w/CM &/OR wo/CM  Result Date: 07/01/2020 CLINICAL DATA:  Aortic disease, nontraumatic. Headache for 2 weeks. LEFT eye blurry vision for 2 days. Mid chest pain. EXAM: CT ANGIOGRAPHY CHEST WITH CONTRAST TECHNIQUE: Multidetector CT imaging of the chest was performed using the standard protocol during bolus administration of intravenous contrast. Multiplanar CT image reconstructions and MIPs were obtained to evaluate the vascular anatomy. CONTRAST:  150mL  OMNIPAQUE IOHEXOL 350 MG/ML SOLN COMPARISON:  CT angio neck today, CT chest on 10/16/2013 FINDINGS: Cardiovascular: Heart size is normal. No significant coronary artery calcification. No pericardial effusion. Thoracic aorta is well opacified by contrast bolus. There is no aneurysm or dissection. The pulmonary arteries are normal in appearance accounting for contrast bolus timing. Mediastinum/Nodes: The visualized portion of the thyroid gland has a normal appearance. No significant mediastinal, hilar, or axillary adenopathy. The esophagus is unremarkable. Lungs/Pleura: Crescentic ground-glass opacity is identified in the RIGHT lung apex, 2.4 x 1.1 centimeters, as seen on CT of the neck. Similar opacity is identified at the LEFT lung apex, measuring 1.2 x 1.0 centimeters. There are similar patchy areas of airspace filling opacities within the LOWER lobes and central UPPER lobes. No pleural effusion or consolidation. Paraseptal emphysematous changes are identified in the UPPER lobes. Upper Abdomen: Small circumscribed lesions in the liver measure 1.1 centimeters and smaller. Findings are favored to represent benign cysts or hemangiomas. Gallbladder is present and only partially imaged. Partially imaged intrarenal calculus in the LEFT kidney is 9 millimeters. Musculoskeletal: No chest wall abnormality. No acute osseous findings. Numerous Schmorl's nodes in the midthoracic levels. Review of the MIP images confirms the above findings. IMPRESSION: 1. Technically adequate exam showing no pulmonary embolus. 2. Patchy areas of airspace filling opacities in the lungs, consistent with infectious/inflammatory process. Recommend follow-up CT in 3-6 months. 3. Small hepatic lesions, favored to represent benign cysts or hemangiomas. 4. Partially imaged intrarenal calculus in the LEFT kidney. 5. Aortic Atherosclerosis (ICD10-I70.0) and Emphysema (ICD10-J43.9). Electronically Signed   By: Norva PavlovElizabeth  Brown M.D.   On: 07/01/2020 15:34     Procedures .Critical Care Performed by: Derwood KaplanNanavati, Rhona Fusilier, MD Authorized by: Derwood KaplanNanavati, Roey Coopman, MD   Critical care provider statement:    Critical care time (minutes):  36   Critical care was time spent personally by me on the following activities:  Discussions with consultants, evaluation of patient's response to treatment, examination of patient, ordering and performing treatments and interventions, ordering and review of laboratory studies, ordering and review of radiographic studies, pulse oximetry, re-evaluation of patient's condition, obtaining history from patient or surrogate and review of old charts     Medications Ordered in ED Medications  fluorescein ophthalmic strip 1 strip (has no administration in time range)  metoCLOPramide (REGLAN) injection 10 mg (10 mg Intravenous Given 07/01/20 1327)  fentaNYL (SUBLIMAZE) injection 50 mcg (50 mcg Intravenous Given 07/01/20 1327)  sodium chloride 0.9 % bolus 1,000 mL (0 mLs Intravenous Stopped 07/01/20 1532)  iohexol (OMNIPAQUE) 350 MG/ML injection 150 mL (150 mLs Intravenous Contrast Given 07/01/20 1424)  tetracaine (PONTOCAINE)  0.5 % ophthalmic solution 2 drop (2 drops Left Eye Given 07/01/20 1532)  ketorolac (TORADOL) 15 MG/ML injection 15 mg (15 mg Intravenous Given 07/01/20 1532)  dexamethasone (DECADRON) injection 10 mg (10 mg Intravenous Given 07/01/20 1532)    ED Course  I have reviewed the triage vital signs and the nursing notes.  Pertinent labs & imaging results that were available during my care of the patient were reviewed by me and considered in my medical decision making (see chart for details).    MDM Rules/Calculators/A&P                          55 year old comes in with chief complaint of headache and blurry vision.  Patient has history of CAD, bipolar disorder.  He was recently seen in the ER for chest pain and had delta troponin which were negative and discharged with outpatient follow-up.  His primary complaint  is headache and left-sided blurry vision.  It is unclear when he was last normal, but patient cannot with certainty say that he was last normal a week ago.  The headaches have been present for more than a week and the visual disturbance started later on.  On exam patient has visual field deficit and also blurry vision through the left eye along with left upper extremity drift and subjective sensory deficits to the left hemibody.  Concerns for stroke.  Other possibilities considered include intracranial bleed, aneurysm, migraines, glaucoma.  In the setting of patient complaining of intermittent chest pain, even dissection is in the differential.  We will initiate the work-up with CT head and CT angiogram.  3:53 PM Discussed case with Dr. Ezzie Dural, neurology.  He would prefer that the patient be transferred to [facility] given his deficits.  He has reviewed CT angiogram with me.  Medicine to admit.  3:53 PM Patient's ocular exam revealed slightly elevated pressure without any corneal abrasion.  He continues to have left upper extremity weakness.  His headache is a lot better after the numbing of the eye. Continue with the plan for admission.  Final Clinical Impression(s) / ED Diagnoses Final diagnoses:  Acute stroke due to ischemia [facility])    Rx / DC Orders ED Discharge Orders    None       Derwood Kaplan, MD 07/01/20 1554

## 2020-07-01 NOTE — ED Notes (Signed)
Pt very abusive to staff at this time wanting food pt was explained that the reason for his wait for food was because of a very sick pt that his nurse and the Dr. Estell Harpin and several more nurses were in with the pt was very rude and disrespectful about the other pt. And staff as well Charmayne Odell

## 2020-07-02 ENCOUNTER — Observation Stay (HOSPITAL_COMMUNITY): Payer: Self-pay

## 2020-07-02 DIAGNOSIS — R531 Weakness: Secondary | ICD-10-CM

## 2020-07-02 DIAGNOSIS — I6389 Other cerebral infarction: Secondary | ICD-10-CM

## 2020-07-02 DIAGNOSIS — R29818 Other symptoms and signs involving the nervous system: Secondary | ICD-10-CM

## 2020-07-02 LAB — HEMOGLOBIN A1C
Hgb A1c MFr Bld: 5.4 % (ref 4.8–5.6)
Mean Plasma Glucose: 108 mg/dL

## 2020-07-02 LAB — ECHOCARDIOGRAM COMPLETE
AR max vel: 3.16 cm2
AV Area VTI: 3.14 cm2
AV Area mean vel: 2.9 cm2
AV Mean grad: 6 mmHg
AV Peak grad: 12.3 mmHg
Ao pk vel: 1.75 m/s
Area-P 1/2: 3.6 cm2
Height: 73 in
S' Lateral: 2.7 cm
Weight: 3072.33 oz

## 2020-07-02 LAB — LIPID PANEL
Cholesterol: 192 mg/dL (ref 0–200)
HDL: 33 mg/dL — ABNORMAL LOW (ref >40)
LDL Cholesterol: 144 mg/dL — ABNORMAL HIGH (ref 0–99)
Total CHOL/HDL Ratio: 5.8 RATIO
Triglycerides: 76 mg/dL (ref <150)
VLDL: 15 mg/dL (ref 0–40)

## 2020-07-02 MED ORDER — CLOPIDOGREL BISULFATE 75 MG PO TABS: 75.0000 mg | ORAL_TABLET | Freq: Every day | ORAL | Status: DC

## 2020-07-02 MED ORDER — DIPHENHYDRAMINE HCL 25 MG PO CAPS
25.0000 mg | ORAL_CAPSULE | Freq: Three times a day (TID) | ORAL | Status: DC | PRN
  Administered 2020-07-03: 25 mg via ORAL
  Filled 2020-07-02: qty 1

## 2020-07-02 MED ORDER — CLOPIDOGREL BISULFATE 75 MG PO TABS
75.0000 mg | ORAL_TABLET | Freq: Every day | ORAL | Status: DC
  Administered 2020-07-02 – 2020-07-03 (×2): 75 mg via ORAL
  Filled 2020-07-02 (×2): qty 1

## 2020-07-02 MED ORDER — ROSUVASTATIN CALCIUM 20 MG PO TABS
20.0000 mg | ORAL_TABLET | Freq: Every day | ORAL | Status: DC
  Administered 2020-07-02 – 2020-07-03 (×2): 20 mg via ORAL
  Filled 2020-07-02 (×2): qty 1

## 2020-07-02 MED ORDER — SODIUM CHLORIDE 0.9 % IV BOLUS
1000.0000 mL | Freq: Once | INTRAVENOUS | Status: AC
  Administered 2020-07-02: 1000 mL via INTRAVENOUS

## 2020-07-02 MED ORDER — GADOBUTROL 1 MMOL/ML IV SOLN
8.0000 mL | Freq: Once | INTRAVENOUS | Status: AC | PRN
  Administered 2020-07-02: 8 mL via INTRAVENOUS

## 2020-07-02 MED ORDER — LEVOFLOXACIN 500 MG PO TABS
750.0000 mg | ORAL_TABLET | Freq: Every day | ORAL | Status: DC
  Administered 2020-07-02 – 2020-07-03 (×2): 750 mg via ORAL
  Filled 2020-07-02 (×2): qty 2

## 2020-07-02 MED ORDER — CLONAZEPAM 1 MG PO TABS
1.0000 mg | ORAL_TABLET | Freq: Four times a day (QID) | ORAL | Status: DC | PRN
  Administered 2020-07-02 – 2020-07-03 (×4): 1 mg via ORAL
  Filled 2020-07-02 (×4): qty 1

## 2020-07-02 NOTE — Consult Note (Signed)
NEUROLOGY CONSULTATION NOTE   Date of service: July 02, 2020 Patient Name: [name] [name] MRN:  [id number] DOB:  1965/05/15 Reason for consult: L sided weakness and paresthesias, L blurry vision, headaches x1-2 wks _ _ _   _ __   _ __ _ _  __ __   _ __   __ _  History of Present Illness   [name] [name] is a 55 y.o. male with PMH significant for  has a past medical history of Arthritis, Back pain, Bipolar disorder (HCC), MI (myocardial infarction) (HCC), Polysubstance abuse (HCC), TIA (transient ischemic attack), and Tobacco abuse.   55 yo man with hx CAD, bipolar disorder who presented to [facility] ER c/o headaches, visual disturbance, and L-sided weakness and paresthesias. The headache preceded the other sx (began a few weeks ago) and the other deficits developed over the past week. He describes the headache as throbbing maximal behind L eye. L eye vision is blurry which is new but he does not endorse field cuts or loss of peripheral vision. No pain on eye movement.  Patient has never had sx before and there was no trigger that he could identify.  Specifically he denies ever having focal neurologic deficits such as decreased vision, pain with eye movement, dizziness, focal weakness, focal numbness prior to the current episode.  Patient also report chest pain.  He was seen in the ER 10 days ago for chest pain.  He reports that he continues to have intermittent episodes of left-sided chest pain radiating to the back.  He has known history of CAD.   In [facility] ED:  CTH: NAICP  CTA neck:  1. The common carotid, internal carotid and vertebral arteries are patent within the neck without stenosis. Small atherosclerotic disease within the left carotid bifurcation. 2.  Aortic Atherosclerosis (ICD10-I70.0). 3. Focus of ground-glass opacity within the imaged right lung apex. Please refer to concurrently performed and separately reported CT angiogram of the chest for further description.  CTA  head:  1. No intracranial large vessel occlusion or proximal high-grade arterial stenosis. 2. Atherosclerotic irregularity of the M2 and more distal middle cerebral artery branches bilaterally. 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  CNS imaging personally reviewed.  Note: CTA chest performed to further evaluate abnl finding in R lung apex showed:  1. Technically adequate exam showing no pulmonary embolus. 2. Patchy areas of airspace filling opacities in the lungs, consistent with infectious/inflammatory process. Recommend follow-up CT in 3-6 months. 3. Small hepatic lesions, favored to represent benign cysts or hemangiomas. 4. Partially imaged intrarenal calculus in the LEFT kidney. 5. Aortic Atherosclerosis (ICD10-I70.0) and Emphysema (ICD10-J43.9).  MRI brain without contrast performed on arrival to [facility] showed no abnormalities, and specifically did not show e/o infarct.    ROS   10 point review of systems was performed and was negative except as described in HPI.  Past History   Past Medical History:  Diagnosis Date  . Arthritis   . Back pain   . Bipolar disorder (HCC)   . MI (myocardial infarction) (HCC)    at age 87  . Polysubstance abuse (HCC)   . TIA (transient ischemic attack)   . Tobacco abuse    Past Surgical History:  Procedure Laterality Date  . FOOT SURGERY    . PACEMAKER PLACEMENT     Family History  Problem Relation Age of Onset  . Heart failure Mother   . Hypertension Mother   .  Stroke Mother    Social History   Socioeconomic History  . Marital status: Single    Spouse name: Not on file  . Number of children: Not on file  . Years of education: Not on file  . Highest education level: Not on file  Occupational History  . Not on file  Tobacco Use  . Smoking status: Current Every Day Smoker    Types: Cigarettes  . Smokeless tobacco: Never Used  Substance and Sexual Activity  . Alcohol use: No   . Drug use: Yes    Types: Cocaine, Marijuana, Methamphetamines  . Sexual activity: Not on file  Other Topics Concern  . Not on file  Social History Narrative  . Not on file   Social Determinants of Health   Financial Resource Strain: Not on file  Food Insecurity: Not on file  Transportation Needs: Not on file  Physical Activity: Not on file  Stress: Not on file  Social Connections: Not on file   Allergies  Allergen Reactions  . Vicodin [Hydrocodone-Acetaminophen] Nausea And Vomiting    Medications   Medications Prior to Admission  Medication Sig Dispense Refill Last Dose  . albuterol (VENTOLIN HFA) 108 (90 Base) MCG/ACT inhaler Inhale into the lungs.     Marland Kitchen aspirin 81 MG EC tablet Take 81 mg by mouth daily.   07/01/2020 at Unknown time  . cariprazine (VRAYLAR) capsule Take 3 mg by mouth daily.   07/01/2020 at Unknown time  . clonazePAM (KLONOPIN) 1 MG tablet Take 1 mg by mouth 4 (four) times daily.   07/01/2020 at Unknown time  . diphenhydrAMINE (BENADRYL) 25 MG tablet Take 25 mg by mouth in the morning and at bedtime.   06/30/2020 at Unknown time  . fluticasone (FLONASE) 50 MCG/ACT nasal spray Place 2 sprays into both nostrils 2 (two) times daily as needed for allergies or rhinitis.   07/01/2020 at Unknown time  . Oxycodone HCl 10 MG TABS Take 10 mg by mouth 3 (three) times daily as needed (for pain).   Past Week at Unknown time  . chlordiazePOXIDE (LIBRIUM) 25 MG capsule Take 1 capsule (25 mg total) by mouth 4 (four) times daily. (Patient not taking: No sig reported) 8 capsule 0   . chlordiazePOXIDE (LIBRIUM) 25 MG capsule Take 1 capsule (25 mg total) by mouth 3 (three) times daily. (Patient not taking: Reported on 05/10/2020) 6 capsule 0   . gabapentin (NEURONTIN) 400 MG capsule Take 1 capsule (400 mg total) by mouth 3 (three) times daily. (Patient not taking: Reported on 05/10/2020) 21 capsule 0   . meloxicam (MOBIC) 15 MG tablet Take 1 tablet (15 mg total) by mouth daily. (Patient  not taking: Reported on 05/10/2020) 7 tablet 0   . nicotine (NICODERM CQ - DOSED IN MG/24 HOURS) 21 mg/24hr patch Place 1 patch (21 mg total) onto the skin daily. (Patient not taking: Reported on 05/10/2020) 28 patch 0      Vitals   Vitals:   07/01/20 2134 07/01/20 2339 07/02/20 0144 07/02/20 0300  BP: 112/65 114/64 103/66 96/68  Pulse: 72 80 70 78  Resp: 20 20 18 18   Temp:  98.9 F (37.2 C) 99.1 F (37.3 C) 99.6 F (37.6 C)  TempSrc:  Oral Oral Oral  SpO2: 96% 97% 95% 97%  Weight: 87.1 kg     Height:         Body mass index is 25.33 kg/m.  Physical Exam    Physical Exam Gen: A&Ox4, NAD HEENT:  Atraumatic, normocephalic; oropharynx clear, tongue without atrophy or fasciculations. Resp: CTAB, normal work of breathing CV: RRR, extremities appear well-perfused. Abd: soft/NT/ND Extrem: Nml bulk; no cyanosis, clubbing, or edema.  Neuro: *MS: A&O x4. Follows multi-step commands.  *Speech: no dysarthria or aphasia, able to name and repeat. *CN:    I: Deferred   II,III: PERRLA, VFF by confrontation on R, somewhat impaired on L states vision out of that eye is "blurry," optic discs not visualized 2/2 pupillary constriction. No APD.   III,IV,VI: EOMI w/o nystagmus, no ptosis   V: Sensation impaired to LT in V2 on L.   VII: Eyelid closure was full.  L NLF flattening   VIII: Hearing intact to voice   IX,X: Voice normal, palate elevates symmetrically    XI: SCM/trap 5/5 bilat   XII: Tongue protrudes midline, no atrophy or fasciculations  *Motor:   Normal bulk.  No tremor, rigidity or bradykinesia. No pronator drift.   Strength: Dlt Bic Tri WE WrF FgS Gr HF KnF KnE PlF DoF    Left 4 5 4  4+ 5 5 4  4+ 4+ 4+ 5 5    Right 5 5 5 5 5 5 5  4* 5 5 5 5   * R HF weak 2/2 recent injury *Sensory: Impaired to PP LUE and LLE. *Coordination:  FNF with dysmetria bilat *Reflexes:  2+ brisk and symmetric throughout without clonus; toes down-going bilat *Gait: deferred   Labs   CBC:  Recent  Labs  Lab 07/01/20 1317  WBC 11.2*  NEUTROABS 7.2  HGB 14.9  HCT 46.1  MCV 90.0  PLT 343    Basic Metabolic Panel:  Lab Results  Component Value Date   NA 134 (L) 07/01/2020   K 4.0 07/01/2020   CO2 27 07/01/2020   GLUCOSE 96 07/01/2020   BUN 17 07/01/2020   CREATININE 1.06 07/01/2020   CALCIUM 9.0 07/01/2020   GFRNONAA >60 07/01/2020   GFRAA >60 08/07/2014   Lipid Panel:  Lab Results  Component Value Date   LDLCALC 144 (H) 07/02/2020   HgbA1c:  Lab Results  Component Value Date   HGBA1C 5.1 05/11/2020   Urine Drug Screen:     Component Value Date/Time   LABOPIA NONE DETECTED 07/01/2020 1357   COCAINSCRNUR NONE DETECTED 07/01/2020 1357   COCAINSCRNUR POSITIVE (A) 08/07/2014 2152   LABBENZ NONE DETECTED 07/01/2020 1357   AMPHETMU NONE DETECTED 07/01/2020 1357   THCU POSITIVE (A) 07/01/2020 1357   LABBARB NONE DETECTED 07/01/2020 1357    Alcohol Level     Component Value Date/Time   ETH <10 07/01/2020 1317     Impression   55 yo man with hx CAD, bipolar disorder admitted for 2 wk history of new L periorbital headaches with migrainous features, new blurred vision in L eye, and weakness and paresthesias in face/arm/leg. His exam findings are consistent and localize to an intracranial abnormality. His brain MRI was normal and showed no stroke. However the study was limited bc contrast was not used. I would like to repeat contrasted sequences of his brain and also do MRI orbits.   Recommendations   Obtain additional MRI brain sequences with contrast (I will order) MRI orbits Please request ophtho consult in AM If above unrevealing consider LP  Will continue to follow  ______________________________________________________________________   Thank you for the opportunity to take part in the care of this patient. If you have any further questions, please contact the neurology consultation attending.  Signed,  Bing Neighborsolleen Talha Iser, MD  [facility] [phone]  If 7pm- 7am, please page neurology on call as listed in AMION.

## 2020-07-02 NOTE — Evaluation (Signed)
Occupational Therapy Evaluation Patient Details Name: [name] [name] MRN: [id number] DOB: [birth year] Today's Date: 07/02/2020    History of Present Illness 55 y.o. male presented to [facility] ER 07/01/20 c/o headaches, visual disturbance, and L-sided weakness and paresthesias. CT head and MRI brain no acute changes; CTA head severe focal stenosis MCA branch; repeat MRI with contrast pending. +pna    PMH significant for Arthritis, Back pain, Bipolar disorder, MI, Polysubstance abuse, TIA, and Tobacco abuse.   Clinical Impression   Pt PTA: Pt living with s.o at home, 24/7 assist as needed. Pt reports independence with ADL annd mobility at home. pt currently,  pt and s/o report that pt is independent in hospital, walking around without AD, has walked to nurses's station and back earlier today. Pt performing bed mobilty with modified independence, but deferred walking at this time. Material for S/S of stroke discussed.  Vision, intact. Pt does not require continued OT skilled services. OT signing off.     Follow Up Recommendations  No OT follow up    Equipment Recommendations  None recommended by OT    Recommendations for Other Services       Precautions / Restrictions Precautions Precautions: None Restrictions Weight Bearing Restrictions: No      Mobility Bed Mobility Overal bed mobility: Independent                  Transfers                 General transfer comment: pt denied need    Balance Overall balance assessment: Modified Independent                                         ADL either performed or assessed with clinical judgement   ADL Overall ADL's : Modified independent                                       General ADL Comments: pt and s/o report that pt is independent, walking around with AD, has walked to nurses's station and back earlier today. Pt performing bed mobilty with modified independence, but deferred  walking at this time. S/S of stroke discussed.     Vision Baseline Vision/History: Wears glasses Wears Glasses: Reading only Patient Visual Report: No change from baseline Vision Assessment?: Yes Eye Alignment: Within Functional Limits Ocular Range of Motion: Within Functional Limits Alignment/Gaze Preference: Within Defined Limits Tracking/Visual Pursuits: Able to track stimulus in all quads without difficulty     Perception     Praxis      Pertinent Vitals/Pain Pain Assessment: Faces Faces Pain Scale: Hurts a little bit Pain Location: L arm to chest Pain Descriptors / Indicators: Discomfort;Tingling Pain Intervention(s): Monitored during session;Repositioned     Hand Dominance Right   Extremity/Trunk Assessment Upper Extremity Assessment Upper Extremity Assessment: Overall WFL for tasks assessed;LUE deficits/detail LUE Deficits / Details: AROM, WFLs; 5/5 MMT; pt with numbness and tingling in LUE, but sensation intact. LUE Sensation: WNL   Lower Extremity Assessment Lower Extremity Assessment: Overall WFL for tasks assessed   Cervical / Trunk Assessment Cervical / Trunk Assessment: Normal   Communication Communication Communication: No difficulties   Cognition Arousal/Alertness: Awake/alert Behavior During Therapy: WFL for tasks assessed/performed Overall Cognitive Status: Within Functional Limits for tasks assessed  General Comments: Discussion of s/s of a CVA "BEFAST." Pt and s/o in room. A/O x4   General Comments  S/o in room    Exercises     Shoulder Instructions      Home Living Family/patient expects to be discharged to:: Private residence Living Arrangements: Spouse/significant other Available Help at Discharge: Family;Available 24 hours/day Type of Home: House             Bathroom Shower/Tub: Tub/shower unit;Walk-in shower   Bathroom Toilet: Standard                Prior  Functioning/Environment Level of Independence: Independent                 OT Problem List:        OT Treatment/Interventions:      OT Goals(Current goals can be found in the care plan section) Acute Rehab OT Goals Patient Stated Goal: to go home soon OT Goal Formulation: All assessment and education complete, DC therapy Potential to Achieve Goals: Good  OT Frequency:     Barriers to D/C:            Co-evaluation              AM-PAC OT "6 Clicks" Daily Activity     Outcome Measure Help from another person eating meals?: None Help from another person taking care of personal grooming?: None Help from another person toileting, which includes using toliet, bedpan, or urinal?: None Help from another person bathing (including washing, rinsing, drying)?: None Help from another person to put on and taking off regular upper body clothing?: None Help from another person to put on and taking off regular lower body clothing?: None 6 Click Score: 24   End of Session Nurse Communication: Mobility status  Activity Tolerance: Patient tolerated treatment well Patient left: in bed;with call bell/phone within reach;with family/visitor present  OT Visit Diagnosis: Muscle weakness (generalized) (M62.81)                Time: 1287-8676 OT Time Calculation (min): 10 min Charges:  OT General Charges $OT Visit: 1 Visit OT Evaluation $OT Eval Low Complexity: 1 Low Flora Lipps, OTR/L Acute Rehabilitation Services Pager: [phone] Office: [phone]   Angelette Ganus C 07/02/2020, 2:44 PM

## 2020-07-02 NOTE — Progress Notes (Signed)
Patient is full strength 5/5 throughout and reports his blurry vision as well as left-sided weakness as well as headaches are intermittent.  He reports he has spells of these events 2-3 times a day.  Imaging to date has been negative.  Notably MRI technician discussed with me that MRI orbits would include all MRI brain with contrast imaging and requested to cancel MRI brain ordered due to redundancy.  However on completion of MRI brain there do seem to be some missing data and I will discuss with neuroradiology tomorrow whether it may be useful to repeat a full MRI brain with contrast.  Additionally we will start EEG with long-term monitoring overnight with an attempt to capture some spells for characterization.  Do not feel that these events are TIAs given the very stereotyped nature and recurrence of the same spells over a very long period of time.  Do not recommend Plavix at this time  Brooke Dare MD-PhD [facility] [phone]   Available 7 AM to 7 PM, outside these hours please contact Neurologist on call listed on AMION

## 2020-07-02 NOTE — Progress Notes (Signed)
  Echocardiogram 2D Echocardiogram has been performed.  [name] [name] F 07/02/2020, 11:26 AM

## 2020-07-02 NOTE — Progress Notes (Addendum)
PROGRESS NOTE                                                                                                                                                                                                             Patient Demographics:    [name] [name], is a 55 y.o. male, DOB - 03-08-1966, ZOX:[id number]  Admit date - 07/01/2020   Admitting Physician Ejiroghene Wendall Stade, MD  Outpatient Primary MD for the patient is [facility]  LOS - 0  Chief Complaint  Patient presents with  . Headache       Brief Narrative (HPI from H&P)   [name] [name] is a 55 y.o. male with medical history significant for amphetamine, cannabis, opiate use, depression, tobacco use, bipolar disorder, presented to the hospital with a 2-week history of left-sided headache, intermittent left-sided blurry vision along with some left arm weakness tingling and numbness.  He initially presented to [facility] and was then transferred to [facility] for further work-up.   Subjective:    [name] [name] today has, No headache, No chest pain, No abdominal pain - No Nausea, No new weakness tingling or numbness, No Cough - SOB. Vison is back to normal.   Assessment  & Plan :   1.  Left-sided headaches with left-sided intermittent blurry vision and left-sided tingling and numbness mostly in the upper extremity. 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  Echo is pending.  Neurology to follow.  Since his symptoms have resolved we will have him follow-up with ophthalmology outpatient if needed.  2.  History of bipolar disorder.  Continue Vraylar.  3.  Chronic pain and anxiety.   Home medications continued.  4.  Polysubstance abuse.  Counseled to quit all including smoking.  5.  Nonspecific CT scan changes.  4 days of Levaquin thereafter outpatient follow-up with PCP.  Request PCP to recheck CT scan in 3 months.   Condition - Fair  Family Communication  :  Patient to update himself  Code Status :  Full  Consults  :  Neuro  Procedures  :  MRI - Orbits -   MRI - non acute  CTA Head and Neck - CTA neck: 1. The common carotid, internal carotid and vertebral arteries are patent within the neck without stenosis. Small atherosclerotic disease within the left carotid bifurcation. 2.  Aortic Atherosclerosis (ICD10-I70.0). 3. Focus of ground-glass opacity within the imaged right lung apex. Please refer to concurrently performed and separately reported CT angiogram of the chest for further description. CTA head: 1. No intracranial large vessel occlusion or proximal high-grade arterial stenosis. 2. Atherosclerotic irregularity of the M2 and more distal middle cerebral artery branches bilaterally. 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  CTA lungs - 1. Technically adequate exam showing no pulmonary embolus. 2. Patchy areas of airspace filling opacities in the lungs, consistent with infectious/inflammatory process. Recommend follow-up CT in 3-6 months. 3. Small hepatic lesions, favored to represent benign cysts or hemangiomas. 4. Partially imaged intrarenal calculus in the LEFT kidney. 5. Aortic Atherosclerosis (ICD10-I70.0) and Emphysema (ICD10-J43.9).  PUD Prophylaxis :   Disposition Plan  :    Status is: Observation  Dispo: The patient is from: Home              Anticipated d/c is to: Home              Patient currently is not medically stable to d/c.   Difficult to place patient No  DVT Prophylaxis  :  Lovenox    Lab Results  Component Value Date   PLT 343 07/01/2020    Diet :  Diet Order            Diet Heart  Room service appropriate? Yes; Fluid consistency: Thin  Diet effective now                  Inpatient Medications Scheduled Meds: . aspirin  81 mg Oral Daily  . cariprazine  3 mg Oral Daily  . enoxaparin (LOVENOX) injection  40 mg Subcutaneous QHS  . levofloxacin  750 mg Oral Daily  . rosuvastatin  20 mg Oral Daily   Continuous Infusions: PRN Meds:.acetaminophen **OR** acetaminophen (TYLENOL) oral liquid 160 mg/5 mL **OR** acetaminophen, clonazePAM, diphenhydrAMINE, fluticasone, guaiFENesin-dextromethorphan, metoCLOPramide (REGLAN) injection, oxyCODONE, senna-docusate  Antibiotics  :   Anti-infectives (From admission, onward)   Start     Dose/Rate Route Frequency Ordered Stop   07/02/20 1845  cefTRIAXone (ROCEPHIN) 2 g in sodium chloride 0.9 % 100 mL IVPB  Status:  Discontinued        2 g 200 mL/hr over 30 Minutes Intravenous Every 24 hours 07/01/20 1853 07/01/20 1858   07/02/20 1215  levofloxacin (LEVAQUIN) tablet 750 mg        750 mg Oral Daily 07/02/20 1125 07/06/20 0959   07/01/20 1858  cefTRIAXone (ROCEPHIN) 2 g in sodium chloride 0.9 % 100 mL IVPB  Status:  Discontinued        2 g 200 mL/hr over 30 Minutes Intravenous Every 24 hours 07/01/20 1858 07/02/20 1125   07/01/20 1845  cefTRIAXone (ROCEPHIN) 1 g in sodium chloride 0.9 % 100 mL IVPB  Status:  Discontinued        1 g 200 mL/hr over 30 Minutes Intravenous Every 24 hours 07/01/20 1842 07/01/20 1853   07/01/20 1845  azithromycin (ZITHROMAX) 500 mg in sodium chloride 0.9 % 250 mL IVPB  Status:  Discontinued        500 mg 250 mL/hr over 60 Minutes Intravenous Every 24 hours  07/01/20 1842 07/02/20 1125          Objective:   Vitals:   07/01/20 2339 07/02/20 0144 07/02/20 0300 07/02/20 0942  BP: 114/64 103/66 96/68 116/81  Pulse: 80 70 78 87  Resp: 20 18 18 15   Temp: 98.9 F (37.2 C) 99.1 F (37.3 C) 99.6 F (37.6 C) 98.1 F (36.7 C)  TempSrc: Oral Oral Oral Oral  SpO2: 97% 95% 97% 99%  Weight:      Height:         SpO2: 99 %  Wt Readings from Last 3 Encounters:  07/01/20 87.1 kg  06/01/19 97.5 kg  10/13/16 88.5 kg     Intake/Output Summary (Last 24 hours) at 07/02/2020 1132 Last data filed at 07/02/2020 07/04/2020 Gross per 24 hour  Intake 1100 ml  Output 330 ml  Net 770 ml     Physical Exam  Awake Alert, No new F.N deficits, Normal affect Summers.AT,PERRAL Supple Neck,No JVD, No cervical lymphadenopathy appriciated.  Symmetrical Chest wall movement, Good air movement bilaterally, CTAB RRR,No Gallops,Rubs or new Murmurs, No Parasternal Heave +ve B.Sounds, Abd Soft, No tenderness, No organomegaly appriciated, No rebound - guarding or rigidity. No Cyanosis, Clubbing or edema, No new Rash or bruise       Data Review:   Recent Labs  Lab 07/01/20 1317  WBC 11.2*  HGB 14.9  HCT 46.1  PLT 343  MCV 90.0  MCH 29.1  MCHC 32.3  RDW 13.2  LYMPHSABS 2.4  MONOABS 1.2*  EOSABS 0.3  BASOSABS 0.1    Recent Labs  Lab 07/01/20 1317 07/01/20 2159  NA 134*  --   K 4.0  --   CL 100  --   CO2 27  --   GLUCOSE 96  --   BUN 17  --   CREATININE 1.06  --   CALCIUM 9.0  --   AST 22  --   ALT 25  --   ALKPHOS 79  --   BILITOT 0.4  --   ALBUMIN 4.3  --   CRP  --  0.5  INR 1.0  --     Recent Labs  Lab 07/01/20 1416 07/01/20 2159  CRP  --  0.5  SARSCOV2NAA NEGATIVE  --     ------------------------------------------------------------------------------------------------------------------ Recent Labs    07/02/20 0358  CHOL 192  HDL 33*  LDLCALC 144*  TRIG 76  CHOLHDL 5.8    Lab Results  Component Value Date   HGBA1C 5.1 05/11/2020   ------------------------------------------------------------------------------------------------------------------ No results for input(s): TSH, T4TOTAL, T3FREE, THYROIDAB in the last 72 hours.  Invalid input(s): FREET3 ------------------------------------------------------------------------------------------------------------------ No  results for input(s): VITAMINB12, FOLATE, FERRITIN, TIBC, IRON, RETICCTPCT in the last 72 hours.  Coagulation profile Recent Labs  Lab 07/01/20 1317  INR 1.0    No results for input(s): DDIMER in the last 72 hours.  Cardiac Enzymes No results for input(s): CKMB, TROPONINI, MYOGLOBIN in the last 168 hours.  Invalid input(s): CK ------------------------------------------------------------------------------------------------------------------    Component Value Date/Time   BNP 9.4 06/21/2020 1844    Micro Results Recent Results (from the past 240 hour(s))  Resp Panel by RT-PCR (Flu A&B, Covid) Nasopharyngeal Swab     Status: None   Collection Time: 07/01/20  2:16 PM   Specimen: Nasopharyngeal Swab; Nasopharyngeal(NP) swabs in vial transport medium  Result Value Ref Range Status   SARS Coronavirus 2 by RT PCR NEGATIVE NEGATIVE Final    Comment: (NOTE) SARS-CoV-2 target nucleic acids are NOT DETECTED.  The SARS-CoV-2 RNA is generally detectable in upper respiratory specimens during the acute phase of infection. The lowest concentration of SARS-CoV-2 viral copies this assay can detect is 138 copies/mL. A negative result does not preclude SARS-Cov-2 infection and should not be used as the sole basis for treatment or other patient management decisions. A negative result may occur with  improper specimen collection/handling, submission of specimen other than nasopharyngeal swab, presence of viral mutation(s) within the areas targeted by this assay, and inadequate number of viral copies(<138 copies/mL). A negative result must be combined with clinical observations, patient history, and epidemiological information. The expected result is Negative.  Fact Sheet for Patients:  BloggerCourse.com  Fact Sheet for Healthcare Providers:  SeriousBroker.it  This test is no t yet approved or cleared by the [place] FDA and  has been  authorized for detection and/or diagnosis of SARS-CoV-2 by FDA under an Emergency Use Authorization (EUA). This EUA will remain  in effect (meaning this test can be used) for the duration of the COVID-19 declaration under Section 564(b)(1) of the Act, 21 U.S.C.section 360bbb-3(b)(1), unless the authorization is terminated  or revoked sooner.       Influenza A by PCR NEGATIVE NEGATIVE Final   Influenza B by PCR NEGATIVE NEGATIVE Final    Comment: (NOTE) The Xpert Xpress SARS-CoV-2/FLU/RSV plus assay is intended as an aid in the diagnosis of influenza from Nasopharyngeal swab specimens and should not be used as a sole basis for treatment. Nasal washings and aspirates are unacceptable for Xpert Xpress SARS-CoV-2/FLU/RSV testing.  Fact Sheet for Patients: BloggerCourse.com  Fact Sheet for Healthcare Providers: SeriousBroker.it  This test is not yet approved or cleared by the [place] FDA and has been authorized for detection and/or diagnosis of SARS-CoV-2 by FDA under an Emergency Use Authorization (EUA). This EUA will remain in effect (meaning this test can be used) for the duration of the COVID-19 declaration under Section 564(b)(1) of the Act, 21 U.S.C. section 360bbb-3(b)(1), unless the authorization is terminated or revoked.  Performed at [facility], [address], [place], [place] [address]     Radiology Reports CT Angio Head W or Wo Contrast  Result Date: 07/01/2020 CLINICAL DATA:  Neuro deficit, acute, stroke suspected. Additional history provided: Patient reports headache for 2 weeks and left eye blurry vision for the past 2 days, mid chest pain, pain between shoulder blades. EXAM: CT ANGIOGRAPHY HEAD AND NECK TECHNIQUE: Multidetector CT imaging of the head and neck was performed using the standard protocol during bolus administration of intravenous contrast. Multiplanar CT image reconstructions and MIPs were obtained  to evaluate the vascular anatomy. Carotid stenosis measurements (when applicable) are obtained utilizing NASCET criteria, using the distal internal carotid diameter as the denominator. CONTRAST:  OMNIPAQUE IOHEXOL 350 MG/ML SOLN COMPARISON:  Noncontrast head CT 07/01/2020. Brain MRI 06/15/2014. CT angiogram head/neck 06/14/2014. FINDINGS: CTA NECK FINDINGS Aortic arch: Standard aortic branching. Atherosclerotic plaque within the visualized aortic arch and proximal major branch vessels of the neck. No hemodynamically significant innominate or proximal subclavian artery stenosis. Right carotid system: CCA and ICA patent within the neck without stenosis. No significant atherosclerotic disease. Left carotid system: CCA and ICA patent within the neck without stenosis. Minimal atherosclerotic plaque within the carotid bifurcation. Vertebral arteries: Codominant and patent within the neck without stenosis Skeleton: No acute bony abnormality or aggressive osseous lesion. Cervical spondylosis. Other neck: No neck mass or cervical lymphadenopathy. Upper chest: Reported separately ground-glass opacity within the right lung apex. Paraseptal emphysema.  Review of the MIP images confirms the above findings CTA HEAD FINDINGS Anterior circulation: The intracranial internal carotid arteries are patent. The M1 middle cerebral arteries are patent. No M2 proximal branch occlusion. Atherosclerotic irregularity of the M2 and more distal middle cerebral artery branches bilaterally. Most notably, there is a severe stenosis within a distal M2 right middle cerebral artery vessel (series 11, image 16). The anterior cerebral arteries are patent. No intracranial aneurysm is identified. Posterior circulation: The intracranial vertebral arteries are patent. The basilar artery is patent. The posterior cerebral arteries are patent. Posterior communicating arteries are hypoplastic or absent bilaterally. Venous sinuses: Within the limitations of  contrast timing, no convincing thrombus. Anatomic variants: As described Review of the MIP images confirms the above findings IMPRESSION: CTA neck: 1. The common carotid, internal carotid and vertebral arteries are patent within the neck without stenosis. Small atherosclerotic disease within the left carotid bifurcation. 2.  Aortic Atherosclerosis (ICD10-I70.0). 3. Focus of ground-glass opacity within the imaged right lung apex. Please refer to concurrently performed and separately reported CT angiogram of the chest for further description. CTA head: 1. No intracranial large vessel occlusion or proximal high-grade arterial stenosis. 2. Atherosclerotic irregularity of the M2 and more distal middle cerebral artery branches bilaterally. 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 Electronically Signed   By: Jackey LogeKyle  Golden DO   On: 07/01/2020 15:00   DG Chest 2 View  Result Date: 06/21/2020 CLINICAL DATA:  55 year old male with chest pain. EXAM: CHEST - 2 VIEW COMPARISON:  Chest radiograph dated 05/10/2020. FINDINGS: No focal consolidation, pleural effusion, pneumothorax. Mild interstitial prominence involving the mid to lower lung field, likely chronic. The cardiac silhouette is within limits. No acute osseous pathology. IMPRESSION: No active cardiopulmonary disease. Electronically Signed   By: Elgie CollardArash  Radparvar M.D.   On: 06/21/2020 17:18   CT HEAD WO CONTRAST  Result Date: 07/01/2020 CLINICAL DATA:  Suspected stroke. Headache for 2 weeks. LEFT eye blurry vision for 2 days. Chest pain earlier. EXAM: CT HEAD WITHOUT CONTRAST TECHNIQUE: Contiguous axial images were obtained from the base of the skull through the vertex without intravenous contrast. COMPARISON:  06/01/2019 FINDINGS: Brain: No evidence of acute infarction, hemorrhage, hydrocephalus, extra-axial collection or mass lesion/mass effect. Vascular: No hyperdense vessel or unexpected calcification. Skull: Normal.  Negative for fracture or focal lesion. Sinuses/Orbits: No acute finding. Other: None. IMPRESSION: Negative exam. Electronically Signed   By: Norva PavlovElizabeth  Brown M.D.   On: 07/01/2020 13:26   CT Angio Neck W and/or Wo Contrast  Result Date: 07/01/2020 CLINICAL DATA:  Neuro deficit, acute, stroke suspected. Additional history provided: Patient reports headache for 2 weeks and left eye blurry vision for the past 2 days, mid chest pain, pain between shoulder blades. EXAM: CT ANGIOGRAPHY HEAD AND NECK TECHNIQUE: Multidetector CT imaging of the head and neck was performed using the standard protocol during bolus administration of intravenous contrast. Multiplanar CT image reconstructions and MIPs were obtained to evaluate the vascular anatomy. Carotid stenosis measurements (when applicable) are obtained utilizing NASCET criteria, using the distal internal carotid diameter as the denominator. CONTRAST:  150mL OMNIPAQUE IOHEXOL 350 MG/ML SOLN COMPARISON:  Noncontrast head CT 07/01/2020. Brain MRI 06/15/2014. CT angiogram head/neck 06/14/2014. FINDINGS: CTA NECK FINDINGS Aortic arch: Standard aortic branching. Atherosclerotic plaque within the visualized aortic arch and proximal major branch vessels of the neck. No hemodynamically significant innominate or proximal subclavian artery stenosis. Right carotid system: CCA and ICA patent within the neck  without stenosis. No significant atherosclerotic disease. Left carotid system: CCA and ICA patent within the neck without stenosis. Minimal atherosclerotic plaque within the carotid bifurcation. Vertebral arteries: Codominant and patent within the neck without stenosis Skeleton: No acute bony abnormality or aggressive osseous lesion. Cervical spondylosis. Other neck: No neck mass or cervical lymphadenopathy. Upper chest: Reported separately ground-glass opacity within the right lung apex. Paraseptal emphysema. Review of the MIP images confirms the above findings CTA HEAD FINDINGS  Anterior circulation: The intracranial internal carotid arteries are patent. The M1 middle cerebral arteries are patent. No M2 proximal branch occlusion. Atherosclerotic irregularity of the M2 and more distal middle cerebral artery branches bilaterally. Most notably, there is a severe stenosis within a distal M2 right middle cerebral artery vessel (series 11, image 16). The anterior cerebral arteries are patent. No intracranial aneurysm is identified. Posterior circulation: The intracranial vertebral arteries are patent. The basilar artery is patent. The posterior cerebral arteries are patent. Posterior communicating arteries are hypoplastic or absent bilaterally. Venous sinuses: Within the limitations of contrast timing, no convincing thrombus. Anatomic variants: As described Review of the MIP images confirms the above findings IMPRESSION: CTA neck: 1. The common carotid, internal carotid and vertebral arteries are patent within the neck without stenosis. Small atherosclerotic disease within the left carotid bifurcation. 2.  Aortic Atherosclerosis (ICD10-I70.0). 3. Focus of ground-glass opacity within the imaged right lung apex. Please refer to concurrently performed and separately reported CT angiogram of the chest for further description. CTA head: 1. No intracranial large vessel occlusion or proximal high-grade arterial stenosis. 2. Atherosclerotic irregularity of the M2 and more distal middle cerebral artery branches bilaterally. 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 Electronically Signed   By: Jackey Loge DO   On: 07/01/2020 15:00   MR BRAIN WO CONTRAST  Result Date: 07/02/2020 CLINICAL DATA:  Initial evaluation for headache, left-sided visual loss, blurry vision. EXAM: MRI HEAD WITHOUT CONTRAST TECHNIQUE: Multiplanar, multiecho pulse sequences of the brain and surrounding structures were obtained without intravenous contrast. COMPARISON:  Prior CTA from  07/01/2020. FINDINGS: Brain: Cerebral volume within normal limits for patient age. No focal parenchymal signal abnormality identified. No abnormal foci of restricted diffusion to suggest acute or subacute ischemia. Gray-white matter differentiation well maintained. No encephalomalacia to suggest chronic infarction. No foci of susceptibility artifact to suggest acute or chronic intracranial hemorrhage. No mass lesion, midline shift or mass effect. No hydrocephalus. No extra-axial fluid collection. Major dural sinuses are grossly patent. Pituitary gland and suprasellar region are normal. Midline structures intact and normal. Vascular: Major intracranial vascular flow voids well maintained and normal in appearance. Skull and upper cervical spine: Craniocervical junction normal. Visualized upper cervical spine within normal limits. Bone marrow signal intensity normal. No scalp soft tissue abnormality. Sinuses/Orbits: Globes and orbital soft tissues within normal limits. Paranasal sinuses are largely clear. No significant mastoid effusion. Inner ear structures grossly normal. Other: None. IMPRESSION: Normal brain MRI. No acute intracranial abnormality or findings to explain patient's symptoms identified. Electronically Signed   By: Rise Mu M.D.   On: 07/02/2020 01:27   CT Angio Chest Aorta w/CM &/OR wo/CM  Result Date: 07/01/2020 CLINICAL DATA:  Aortic disease, nontraumatic. Headache for 2 weeks. LEFT eye blurry vision for 2 days. Mid chest pain. EXAM: CT ANGIOGRAPHY CHEST WITH CONTRAST TECHNIQUE: Multidetector CT imaging of the chest was performed using the standard protocol during bolus administration of intravenous contrast. Multiplanar CT image reconstructions and MIPs were  obtained to evaluate the vascular anatomy. CONTRAST:  OMNIPAQUE IOHEXOL 350 MG/ML SOLN COMPARISON:  CT angio neck today, CT chest on 10/16/2013 FINDINGS: Cardiovascular: Heart size is normal. No significant coronary artery  calcification. No pericardial effusion. Thoracic aorta is well opacified by contrast bolus. There is no aneurysm or dissection. The pulmonary arteries are normal in appearance accounting for contrast bolus timing. Mediastinum/Nodes: The visualized portion of the thyroid gland has a normal appearance. No significant mediastinal, hilar, or axillary adenopathy. The esophagus is unremarkable. Lungs/Pleura: Crescentic ground-glass opacity is identified in the RIGHT lung apex, 2.4 x 1.1 centimeters, as seen on CT of the neck. Similar opacity is identified at the LEFT lung apex, measuring 1.2 x 1.0 centimeters. There are similar patchy areas of airspace filling opacities within the LOWER lobes and central UPPER lobes. No pleural effusion or consolidation. Paraseptal emphysematous changes are identified in the UPPER lobes. Upper Abdomen: Small circumscribed lesions in the liver measure 1.1 centimeters and smaller. Findings are favored to represent benign cysts or hemangiomas. Gallbladder is present and only partially imaged. Partially imaged intrarenal calculus in the LEFT kidney is 9 millimeters. Musculoskeletal: No chest wall abnormality. No acute osseous findings. Numerous Schmorl's nodes in the midthoracic levels. Review of the MIP images confirms the above findings. IMPRESSION: 1. Technically adequate exam showing no pulmonary embolus. 2. Patchy areas of airspace filling opacities in the lungs, consistent with infectious/inflammatory process. Recommend follow-up CT in 3-6 months. 3. Small hepatic lesions, favored to represent benign cysts or hemangiomas. 4. Partially imaged intrarenal calculus in the LEFT kidney. 5. Aortic Atherosclerosis (ICD10-I70.0) and Emphysema (ICD10-J43.9). Electronically Signed   By: Norva Pavlov M.D.   On: 07/01/2020 15:34    Time Spent in minutes  30   Susa Raring M.D on 07/02/2020 at 11:32 AM  To page go to [email or web address]

## 2020-07-02 NOTE — Progress Notes (Signed)
OT Cancellation Note  Patient Details Name: [name] [name] MRN: [id number] DOB: 1965/03/20   Cancelled Treatment:    Reason Eval/Treat Not Completed: Patient declined, no reason specified (Pt needs anxiety medication and refusing MRI at this time until it kicks in. OT to continue to follow for OT eval.)   Flora Lipps, OTR/L Acute Rehabilitation Services Pager: [phone] Office: [phone]  Astor Gentle  C 07/02/2020, 9:28 AM

## 2020-07-02 NOTE — Progress Notes (Signed)
PT Cancellation Note  Patient Details Name: ELAND LAMANTIA MRN: [id number] DOB: 1965/03/21   Cancelled Treatment:    Reason Eval/Treat Not Completed: PT screened, no needs identified, will sign off  Patient reports he has been walking in halls independently without a problem. Does not feel he needs PT evaluation.   PT will sign off.   Jerolyn Center, PT Pager [phone]  Zena Amos 07/02/2020, 2:59 PM

## 2020-07-02 NOTE — Progress Notes (Signed)
LTM EEG hooked up and started 

## 2020-07-03 DIAGNOSIS — R569 Unspecified convulsions: Secondary | ICD-10-CM

## 2020-07-03 DIAGNOSIS — R29898 Other symptoms and signs involving the musculoskeletal system: Secondary | ICD-10-CM

## 2020-07-03 MED ORDER — ROSUVASTATIN CALCIUM 20 MG PO TABS: 20.0000 mg | ORAL_TABLET | Freq: Every day | ORAL | 0 refills | Status: DC

## 2020-07-03 MED ORDER — LEVOFLOXACIN 750 MG PO TABS: 750.0000 mg | ORAL_TABLET | Freq: Every day | ORAL | 0 refills | Status: DC

## 2020-07-03 NOTE — Progress Notes (Signed)
PROGRESS NOTE                                                                                                                                                                                                             Patient Demographics:    [name] [name], is a 55 y.o. male, DOB - 12-23-1965, ZOX:[id number]  Admit date - 07/01/2020   Admitting Physician Ejiroghene Wendall Stade, MD  Outpatient Primary MD for the patient is [facility]  LOS - 0  Chief Complaint  Patient presents with  . Headache       Brief Narrative (HPI from H&P)   [name] [name] is a 55 y.o. male with medical history significant for amphetamine, cannabis, opiate use, depression, tobacco use, bipolar disorder, presented to the hospital with a 2-week history of left-sided headache, intermittent left-sided blurry vision along with some left arm weakness tingling and numbness.  He initially presented to [facility] and was then transferred to [facility] for further work-up.   Subjective:   In bed denies any headache, no chest or abdominal pain, no blurry vision.  Feels fine.   Assessment  & Plan :   1.  Left-sided headaches with left-sided intermittent blurry vision and left-sided tingling and numbness mostly in the upper extremity. 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  He would require outpatient IR neurology follow-up for his MCA stenosis monitoring.  2.  History of bipolar disorder with panic attacks.  Continue Vraylar.  3.  Chronic pain and anxiety.  Home medications continued.  4.  Polysubstance abuse.  Counseled to quit all including smoking.  5.  Nonspecific CT scan changes.  4 days of Levaquin thereafter outpatient follow-up with PCP.  Request PCP  to recheck CT scan in 3 months.   Condition - Fair  Family Communication  :  Patient to update himself  Code Status :  Full  Consults  :  Neuro  Procedures  :    MRI - Orbits -   MRI - non acute  CTA Head and Neck - CTA neck: 1. The common carotid, internal carotid and vertebral arteries are patent within the neck without stenosis. Small atherosclerotic disease within the left carotid bifurcation. 2.  Aortic Atherosclerosis (  ICD10-I70.0). 3. Focus of ground-glass opacity within the imaged right lung apex. Please refer to concurrently performed and separately reported CT angiogram of the chest for further description. CTA head: 1. No intracranial large vessel occlusion or proximal high-grade arterial stenosis. 2. Atherosclerotic irregularity of the M2 and more distal middle cerebral artery branches bilaterally. 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  CTA lungs - 1. Technically adequate exam showing no pulmonary embolus. 2. Patchy areas of airspace filling opacities in the lungs, consistent with infectious/inflammatory process. Recommend follow-up CT in 3-6 months. 3. Small hepatic lesions, favored to represent benign cysts or hemangiomas. 4. Partially imaged intrarenal calculus in the LEFT kidney. 5. Aortic Atherosclerosis (ICD10-I70.0) and Emphysema (ICD10-J43.9).  PUD Prophylaxis :   Disposition Plan  :    Status is: Observation  Dispo: The patient is from: Home              Anticipated d/c is to: Home              Patient currently is not medically stable to d/c.   Difficult to place patient No  DVT Prophylaxis  :  Lovenox    Lab Results  Component Value Date   PLT 343 07/01/2020    Diet :  Diet Order            Diet Heart Room service appropriate? Yes; Fluid consistency: Thin  Diet effective now                  Inpatient Medications Scheduled Meds: . aspirin  81 mg Oral Daily  . cariprazine  3 mg Oral Daily  .  clopidogrel  75 mg Oral Daily  . enoxaparin (LOVENOX) injection  40 mg Subcutaneous QHS  . levofloxacin  750 mg Oral Daily  . rosuvastatin  20 mg Oral Daily   Continuous Infusions: PRN Meds:.acetaminophen **OR** acetaminophen (TYLENOL) oral liquid 160 mg/5 mL **OR** acetaminophen, clonazePAM, diphenhydrAMINE, fluticasone, guaiFENesin-dextromethorphan, metoCLOPramide (REGLAN) injection, oxyCODONE, senna-docusate  Antibiotics  :   Anti-infectives (From admission, onward)   Start     Dose/Rate Route Frequency Ordered Stop   07/02/20 1845  cefTRIAXone (ROCEPHIN) 2 g in sodium chloride 0.9 % 100 mL IVPB  Status:  Discontinued        2 g 200 mL/hr over 30 Minutes Intravenous Every 24 hours 07/01/20 1853 07/01/20 1858   07/02/20 1215  levofloxacin (LEVAQUIN) tablet 750 mg        750 mg Oral Daily 07/02/20 1125 07/06/20 0959   07/01/20 1858  cefTRIAXone (ROCEPHIN) 2 g in sodium chloride 0.9 % 100 mL IVPB  Status:  Discontinued        2 g 200 mL/hr over 30 Minutes Intravenous Every 24 hours 07/01/20 1858 07/02/20 1125   07/01/20 1845  cefTRIAXone (ROCEPHIN) 1 g in sodium chloride 0.9 % 100 mL IVPB  Status:  Discontinued        1 g 200 mL/hr over 30 Minutes Intravenous Every 24 hours 07/01/20 1842 07/01/20 1853   07/01/20 1845  azithromycin (ZITHROMAX) 500 mg in sodium chloride 0.9 % 250 mL IVPB  Status:  Discontinued        500 mg 250 mL/hr over 60 Minutes Intravenous Every 24 hours 07/01/20 1842 07/02/20 1125          Objective:   Vitals:   07/02/20 0300 07/02/20 0942 07/02/20 1700 07/02/20 2200  BP: 96/68 116/81 130/85 130/80  Pulse: 78 87 86  73  Resp: 18 15    Temp: 99.6 F (37.6 C) 98.1 F (36.7 C) 98.4 F (36.9 C) 98.5 F (36.9 C)  TempSrc: Oral Oral Oral Oral  SpO2: 97% 99% 99%   Weight:      Height:        SpO2: 99 %  Wt Readings from Last 3 Encounters:  07/01/20 87.1 kg  06/01/19 97.5 kg  10/13/16 88.5 kg    No intake or output data in the 24 hours ending  07/03/20 0837   Physical Exam  Awake Alert, No new F.N deficits, Normal affect [place].AT,PERRAL Supple Neck,No JVD, No cervical lymphadenopathy appriciated.  Symmetrical Chest wall movement, Good air movement bilaterally, CTAB RRR,No Gallops, Rubs or new Murmurs, No Parasternal Heave +ve B.Sounds, Abd Soft, No tenderness, No organomegaly appriciated, No rebound - guarding or rigidity. No Cyanosis, Clubbing or edema, No new Rash or bruise      Data Review:   Recent Labs  Lab 07/01/20 1317  WBC 11.2*  HGB 14.9  HCT 46.1  PLT 343  MCV 90.0  MCH 29.1  MCHC 32.3  RDW 13.2  LYMPHSABS 2.4  MONOABS 1.2*  EOSABS 0.3  BASOSABS 0.1    Recent Labs  Lab 07/01/20 1317 07/01/20 2159 07/02/20 0358  NA 134*  --   --   K 4.0  --   --   CL 100  --   --   CO2 27  --   --   GLUCOSE 96  --   --   BUN 17  --   --   CREATININE 1.06  --   --   CALCIUM 9.0  --   --   AST 22  --   --   ALT 25  --   --   ALKPHOS 79  --   --   BILITOT 0.4  --   --   ALBUMIN 4.3  --   --   CRP  --  0.5  --   INR 1.0  --   --   HGBA1C  --   --  5.4    Recent Labs  Lab 07/01/20 1416 07/01/20 2159  CRP  --  0.5  SARSCOV2NAA NEGATIVE  --     ------------------------------------------------------------------------------------------------------------------ Recent Labs    07/02/20 0358  CHOL 192  HDL 33*  LDLCALC 144*  TRIG 76  CHOLHDL 5.8    Lab Results  Component Value Date   HGBA1C 5.4 07/02/2020   ------------------------------------------------------------------------------------------------------------------ No results for input(s): TSH, T4TOTAL, T3FREE, THYROIDAB in the last 72 hours.  Invalid input(s): FREET3 ------------------------------------------------------------------------------------------------------------------ No results for input(s): VITAMINB12, FOLATE, FERRITIN, TIBC, IRON, RETICCTPCT in the last 72 hours.  Coagulation profile Recent Labs  Lab 07/01/20 1317  INR  1.0    No results for input(s): DDIMER in the last 72 hours.  Cardiac Enzymes No results for input(s): CKMB, TROPONINI, MYOGLOBIN in the last 168 hours.  Invalid input(s): CK ------------------------------------------------------------------------------------------------------------------    Component Value Date/Time   BNP 9.4 06/21/2020 1844    Micro Results Recent Results (from the past 240 hour(s))  Resp Panel by RT-PCR (Flu A&B, Covid) Nasopharyngeal Swab     Status: None   Collection Time: 07/01/20  2:16 PM   Specimen: Nasopharyngeal Swab; Nasopharyngeal(NP) swabs in vial transport medium  Result Value Ref Range Status   SARS Coronavirus 2 by RT PCR NEGATIVE NEGATIVE Final    Comment: (NOTE) SARS-CoV-2 target nucleic acids are NOT DETECTED.  The SARS-CoV-2 RNA  is generally detectable in upper respiratory specimens during the acute phase of infection. The lowest concentration of SARS-CoV-2 viral copies this assay can detect is 138 copies/mL. A negative result does not preclude SARS-Cov-2 infection and should not be used as the sole basis for treatment or other patient management decisions. A negative result may occur with  improper specimen collection/handling, submission of specimen other than nasopharyngeal swab, presence of viral mutation(s) within the areas targeted by this assay, and inadequate number of viral copies(<138 copies/mL). A negative result must be combined with clinical observations, patient history, and epidemiological information. The expected result is Negative.  Fact Sheet for Patients:  BloggerCourse.com  Fact Sheet for Healthcare Providers:  SeriousBroker.it  This test is no t yet approved or cleared by the [place] FDA and  has been authorized for detection and/or diagnosis of SARS-CoV-2 by FDA under an Emergency Use Authorization (EUA). This EUA will remain  in effect (meaning this test  can be used) for the duration of the COVID-19 declaration under Section 564(b)(1) of the Act, 21 U.S.C.section 360bbb-3(b)(1), unless the authorization is terminated  or revoked sooner.       Influenza A by PCR NEGATIVE NEGATIVE Final   Influenza B by PCR NEGATIVE NEGATIVE Final    Comment: (NOTE) The Xpert Xpress SARS-CoV-2/FLU/RSV plus assay is intended as an aid in the diagnosis of influenza from Nasopharyngeal swab specimens and should not be used as a sole basis for treatment. Nasal washings and aspirates are unacceptable for Xpert Xpress SARS-CoV-2/FLU/RSV testing.  Fact Sheet for Patients: BloggerCourse.com  Fact Sheet for Healthcare Providers: SeriousBroker.it  This test is not yet approved or cleared by the [place] FDA and has been authorized for detection and/or diagnosis of SARS-CoV-2 by FDA under an Emergency Use Authorization (EUA). This EUA will remain in effect (meaning this test can be used) for the duration of the COVID-19 declaration under Section 564(b)(1) of the Act, 21 U.S.C. section 360bbb-3(b)(1), unless the authorization is terminated or revoked.  Performed at [facility], [address] [place], [place] [address]     Radiology Reports CT Angio Head W or Wo Contrast  Result Date: 07/01/2020 CLINICAL DATA:  Neuro deficit, acute, stroke suspected. Additional history provided: Patient reports headache for 2 weeks and left eye blurry vision for the past 2 days, mid chest pain, pain between shoulder blades. EXAM: CT ANGIOGRAPHY HEAD AND NECK TECHNIQUE: Multidetector CT imaging of the head and neck was performed using the standard protocol during bolus administration of intravenous contrast. Multiplanar CT image reconstructions and MIPs were obtained to evaluate the vascular anatomy. Carotid stenosis measurements (when applicable) are obtained utilizing NASCET criteria, using the distal internal carotid  diameter as the denominator. CONTRAST:  OMNIPAQUE IOHEXOL 350 MG/ML SOLN COMPARISON:  Noncontrast head CT 07/01/2020. Brain MRI 06/15/2014. CT angiogram head/neck 06/14/2014. FINDINGS: CTA NECK FINDINGS Aortic arch: Standard aortic branching. Atherosclerotic plaque within the visualized aortic arch and proximal major branch vessels of the neck. No hemodynamically significant innominate or proximal subclavian artery stenosis. Right carotid system: CCA and ICA patent within the neck without stenosis. No significant atherosclerotic disease. Left carotid system: CCA and ICA patent within the neck without stenosis. Minimal atherosclerotic plaque within the carotid bifurcation. Vertebral arteries: Codominant and patent within the neck without stenosis Skeleton: No acute bony abnormality or aggressive osseous lesion. Cervical spondylosis. Other neck: No neck mass or cervical lymphadenopathy. Upper chest: Reported separately ground-glass opacity within the right lung apex. Paraseptal emphysema. Review of the  MIP images confirms the above findings CTA HEAD FINDINGS Anterior circulation: The intracranial internal carotid arteries are patent. The M1 middle cerebral arteries are patent. No M2 proximal branch occlusion. Atherosclerotic irregularity of the M2 and more distal middle cerebral artery branches bilaterally. Most notably, there is a severe stenosis within a distal M2 right middle cerebral artery vessel (series 11, image 16). The anterior cerebral arteries are patent. No intracranial aneurysm is identified. Posterior circulation: The intracranial vertebral arteries are patent. The basilar artery is patent. The posterior cerebral arteries are patent. Posterior communicating arteries are hypoplastic or absent bilaterally. Venous sinuses: Within the limitations of contrast timing, no convincing thrombus. Anatomic variants: As described Review of the MIP images confirms the above findings IMPRESSION: CTA neck: 1. The  common carotid, internal carotid and vertebral arteries are patent within the neck without stenosis. Small atherosclerotic disease within the left carotid bifurcation. 2.  Aortic Atherosclerosis (ICD10-I70.0). 3. Focus of ground-glass opacity within the imaged right lung apex. Please refer to concurrently performed and separately reported CT angiogram of the chest for further description. CTA head: 1. No intracranial large vessel occlusion or proximal high-grade arterial stenosis. 2. Atherosclerotic irregularity of the M2 and more distal middle cerebral artery branches bilaterally. 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 Electronically Signed   By: Jackey Loge DO   On: 07/01/2020 15:00   DG Chest 2 View  Result Date: 06/21/2020 CLINICAL DATA:  55 year old male with chest pain. EXAM: CHEST - 2 VIEW COMPARISON:  Chest radiograph dated 05/10/2020. FINDINGS: No focal consolidation, pleural effusion, pneumothorax. Mild interstitial prominence involving the mid to lower lung field, likely chronic. The cardiac silhouette is within limits. No acute osseous pathology. IMPRESSION: No active cardiopulmonary disease. Electronically Signed   By: Elgie Collard M.D.   On: 06/21/2020 17:18   CT HEAD WO CONTRAST  Result Date: 07/01/2020 CLINICAL DATA:  Suspected stroke. Headache for 2 weeks. LEFT eye blurry vision for 2 days. Chest pain earlier. EXAM: CT HEAD WITHOUT CONTRAST TECHNIQUE: Contiguous axial images were obtained from the base of the skull through the vertex without intravenous contrast. COMPARISON:  06/01/2019 FINDINGS: Brain: No evidence of acute infarction, hemorrhage, hydrocephalus, extra-axial collection or mass lesion/mass effect. Vascular: No hyperdense vessel or unexpected calcification. Skull: Normal. Negative for fracture or focal lesion. Sinuses/Orbits: No acute finding. Other: None. IMPRESSION: Negative exam. Electronically Signed   By: Norva Pavlov  M.D.   On: 07/01/2020 13:26   CT Angio Neck W and/or Wo Contrast  Result Date: 07/01/2020 CLINICAL DATA:  Neuro deficit, acute, stroke suspected. Additional history provided: Patient reports headache for 2 weeks and left eye blurry vision for the past 2 days, mid chest pain, pain between shoulder blades. EXAM: CT ANGIOGRAPHY HEAD AND NECK TECHNIQUE: Multidetector CT imaging of the head and neck was performed using the standard protocol during bolus administration of intravenous contrast. Multiplanar CT image reconstructions and MIPs were obtained to evaluate the vascular anatomy. Carotid stenosis measurements (when applicable) are obtained utilizing NASCET criteria, using the distal internal carotid diameter as the denominator. CONTRAST:  OMNIPAQUE IOHEXOL 350 MG/ML SOLN COMPARISON:  Noncontrast head CT 07/01/2020. Brain MRI 06/15/2014. CT angiogram head/neck 06/14/2014. FINDINGS: CTA NECK FINDINGS Aortic arch: Standard aortic branching. Atherosclerotic plaque within the visualized aortic arch and proximal major branch vessels of the neck. No hemodynamically significant innominate or proximal subclavian artery stenosis. Right carotid system: CCA and ICA patent within the neck without stenosis. No  significant atherosclerotic disease. Left carotid system: CCA and ICA patent within the neck without stenosis. Minimal atherosclerotic plaque within the carotid bifurcation. Vertebral arteries: Codominant and patent within the neck without stenosis Skeleton: No acute bony abnormality or aggressive osseous lesion. Cervical spondylosis. Other neck: No neck mass or cervical lymphadenopathy. Upper chest: Reported separately ground-glass opacity within the right lung apex. Paraseptal emphysema. Review of the MIP images confirms the above findings CTA HEAD FINDINGS Anterior circulation: The intracranial internal carotid arteries are patent. The M1 middle cerebral arteries are patent. No M2 proximal branch occlusion.  Atherosclerotic irregularity of the M2 and more distal middle cerebral artery branches bilaterally. Most notably, there is a severe stenosis within a distal M2 right middle cerebral artery vessel (series 11, image 16). The anterior cerebral arteries are patent. No intracranial aneurysm is identified. Posterior circulation: The intracranial vertebral arteries are patent. The basilar artery is patent. The posterior cerebral arteries are patent. Posterior communicating arteries are hypoplastic or absent bilaterally. Venous sinuses: Within the limitations of contrast timing, no convincing thrombus. Anatomic variants: As described Review of the MIP images confirms the above findings IMPRESSION: CTA neck: 1. The common carotid, internal carotid and vertebral arteries are patent within the neck without stenosis. Small atherosclerotic disease within the left carotid bifurcation. 2.  Aortic Atherosclerosis (ICD10-I70.0). 3. Focus of ground-glass opacity within the imaged right lung apex. Please refer to concurrently performed and separately reported CT angiogram of the chest for further description. CTA head: 1. No intracranial large vessel occlusion or proximal high-grade arterial stenosis. 2. Atherosclerotic irregularity of the M2 and more distal middle cerebral artery branches bilaterally. 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 Electronically Signed   By: Jackey Loge DO   On: 07/01/2020 15:00   MR BRAIN WO CONTRAST  Result Date: 07/02/2020 CLINICAL DATA:  Initial evaluation for headache, left-sided visual loss, blurry vision. EXAM: MRI HEAD WITHOUT CONTRAST TECHNIQUE: Multiplanar, multiecho pulse sequences of the brain and surrounding structures were obtained without intravenous contrast. COMPARISON:  Prior CTA from 07/01/2020. FINDINGS: Brain: Cerebral volume within normal limits for patient age. No focal parenchymal signal abnormality identified. No abnormal foci of  restricted diffusion to suggest acute or subacute ischemia. Gray-white matter differentiation well maintained. No encephalomalacia to suggest chronic infarction. No foci of susceptibility artifact to suggest acute or chronic intracranial hemorrhage. No mass lesion, midline shift or mass effect. No hydrocephalus. No extra-axial fluid collection. Major dural sinuses are grossly patent. Pituitary gland and suprasellar region are normal. Midline structures intact and normal. Vascular: Major intracranial vascular flow voids well maintained and normal in appearance. Skull and upper cervical spine: Craniocervical junction normal. Visualized upper cervical spine within normal limits. Bone marrow signal intensity normal. No scalp soft tissue abnormality. Sinuses/Orbits: Globes and orbital soft tissues within normal limits. Paranasal sinuses are largely clear. No significant mastoid effusion. Inner ear structures grossly normal. Other: None. IMPRESSION: Normal brain MRI. No acute intracranial abnormality or findings to explain patient's symptoms identified. Electronically Signed   By: Rise Mu M.D.   On: 07/02/2020 01:27   CT Angio Chest Aorta w/CM &/OR wo/CM  Result Date: 07/01/2020 CLINICAL DATA:  Aortic disease, nontraumatic. Headache for 2 weeks. LEFT eye blurry vision for 2 days. Mid chest pain. EXAM: CT ANGIOGRAPHY CHEST WITH CONTRAST TECHNIQUE: Multidetector CT imaging of the chest was performed using the standard protocol during bolus administration of intravenous contrast. Multiplanar CT image reconstructions and MIPs were obtained to evaluate  the vascular anatomy. CONTRAST:  OMNIPAQUE IOHEXOL 350 MG/ML SOLN COMPARISON:  CT angio neck today, CT chest on 10/16/2013 FINDINGS: Cardiovascular: Heart size is normal. No significant coronary artery calcification. No pericardial effusion. Thoracic aorta is well opacified by contrast bolus. There is no aneurysm or dissection. The pulmonary arteries are  normal in appearance accounting for contrast bolus timing. Mediastinum/Nodes: The visualized portion of the thyroid gland has a normal appearance. No significant mediastinal, hilar, or axillary adenopathy. The esophagus is unremarkable. Lungs/Pleura: [place]cent[place] ground-glass opacity is identified in the RIGHT lung apex, 2.4 x 1.1 centimeters, as seen on CT of the neck. Similar opacity is identified at the LEFT lung apex, measuring 1.2 x 1.0 centimeters. There are similar patchy areas of airspace filling opacities within the LOWER lobes and central UPPER lobes. No pleural effusion or consolidation. Paraseptal emphysematous changes are identified in the UPPER lobes. Upper Abdomen: Small circumscribed lesions in the liver measure 1.1 centimeters and smaller. Findings are favored to represent benign cysts or hemangiomas. Gallbladder is present and only partially imaged. Partially imaged intrarenal calculus in the LEFT kidney is 9 millimeters. Musculoskeletal: No chest wall abnormality. No acute osseous findings. Numerous Schmorl's nodes in the midthoracic levels. Review of the MIP images confirms the above findings. IMPRESSION: 1. Technically adequate exam showing no pulmonary embolus. 2. Patchy areas of airspace filling opacities in the lungs, consistent with infectious/inflammatory process. Recommend follow-up CT in 3-6 months. 3. Small hepatic lesions, favored to represent benign cysts or hemangiomas. 4. Partially imaged intrarenal calculus in the LEFT kidney. 5. Aortic Atherosclerosis (ICD10-I70.0) and Emphysema (ICD10-J43.9). Electronically Signed   By: Norva Pavlov M.D.   On: 07/01/2020 15:34   ECHOCARDIOGRAM COMPLETE  Result Date: 07/02/2020    ECHOCARDIOGRAM REPORT   Patient Name:   RICHERD GRIME Date of Exam: 07/02/2020 Medical Rec #:  [id number]     Height:       73.0 in Accession #:    [id number]    Weight:       192.0 lb Date of Birth:  10-Sep-1965     BSA:          2.115 m Patient Age:    54 years       BP:           116/81 mmHg Patient Gender: M             HR:           84 bpm. Exam Location:  Inpatient Procedure: 2D Echo, Cardiac Doppler and Color Doppler Indications:    Stroke  History:        Patient has prior history of Echocardiogram examinations, most                 recent 06/16/2014. Previous Myocardial Infarction and CAD.  Sonographer:    Roosvelt Maser RDCS Referring Phys: [id number] [facility] IMPRESSIONS  1. Left ventricular ejection fraction, by estimation, is 60 to 65%. The left ventricle has normal function. The left ventricle has no regional wall motion abnormalities. Left ventricular diastolic parameters are consistent with Grade I diastolic dysfunction (impaired relaxation).  2. Right ventricular systolic function is normal. The right ventricular size is normal.  3. The mitral valve is grossly normal. Trivial mitral valve regurgitation. No evidence of mitral stenosis.  4. The aortic valve is tricuspid. There is mild thickening of the aortic valve. Aortic valve regurgitation is not visualized. No aortic stenosis is present.  5. The inferior vena cava is  normal in size with greater than 50% respiratory variability, suggesting right atrial pressure of 3 mmHg. Comparison(s): Compared to prior echo report in 2016, the LVEF is now 60-65% (previously 50-55%). Conclusion(s)/Recommendation(s): No intracardiac source of embolism detected on this transthoracic study. A transesophageal echocardiogram is recommended to exclude cardiac source of embolism if clinically indicated. FINDINGS  Left Ventricle: Left ventricular ejection fraction, by estimation, is 60 to 65%. The left ventricle has normal function. The left ventricle has no regional wall motion abnormalities. The left ventricular internal cavity size was normal in size. There is  no left ventricular hypertrophy. Left ventricular diastolic parameters are consistent with Grade I diastolic dysfunction (impaired relaxation). Normal left ventricular  filling pressure. Right Ventricle: The right ventricular size is normal. No increase in right ventricular wall thickness. Right ventricular systolic function is normal. Left Atrium: Left atrial size was normal in size. Right Atrium: Right atrial size was normal in size. Pericardium: There is no evidence of pericardial effusion. Mitral Valve: The mitral valve is grossly normal. There is mild thickening of the mitral valve leaflet(s). Mild mitral annular calcification. Trivial mitral valve regurgitation. No evidence of mitral valve stenosis. Tricuspid Valve: The tricuspid valve is normal in structure. Tricuspid valve regurgitation is trivial. Aortic Valve: The aortic valve is tricuspid. There is mild thickening of the aortic valve. Aortic valve regurgitation is not visualized. No aortic stenosis is present. Aortic valve mean gradient measures 6.0 mmHg. Aortic valve peak gradient measures 12.2  mmHg. Aortic valve area, by VTI measures 3.14 cm. Pulmonic Valve: The pulmonic valve was not well visualized. Pulmonic valve regurgitation is trivial. Aorta: The aortic root and ascending aorta are structurally normal, with no evidence of dilitation. Venous: The inferior vena cava is normal in size with greater than 50% respiratory variability, suggesting right atrial pressure of 3 mmHg. IAS/Shunts: No atrial level shunt detected by color flow Doppler.  LEFT VENTRICLE PLAX 2D LVIDd:         4.20 cm  Diastology LVIDs:         2.70 cm  LV e' medial:    11.40 cm/s LV PW:         0.70 cm  LV E/e' medial:  6.2 LV IVS:        0.90 cm  LV e' lateral:   14.40 cm/s LVOT diam:     2.00 cm  LV E/e' lateral: 4.9 LV SV:         99 LV SV Index:   47 LVOT Area:     3.14 cm  RIGHT VENTRICLE RV Basal diam:  3.20 cm LEFT ATRIUM             Index       RIGHT ATRIUM           Index LA diam:        3.10 cm 1.47 cm/m  RA Area:     16.20 cm LA Vol (A2C):   45.7 ml 21.61 ml/m RA Volume:   44.30 ml  20.95 ml/m LA Vol (A4C):   36.7 ml 17.35 ml/m  LA Biplane Vol: 44.5 ml 21.04 ml/m  AORTIC VALVE AV Area (Vmax):    3.16 cm AV Area (Vmean):   2.90 cm AV Area (VTI):     3.14 cm AV Vmax:           175.00 cm/s AV Vmean:          118.000 cm/s AV VTI:  0.315 m AV Peak Grad:      12.2 mmHg AV Mean Grad:      6.0 mmHg LVOT Vmax:         176.00 cm/s LVOT Vmean:        109.000 cm/s LVOT VTI:          0.315 m LVOT/AV VTI ratio: 1.00  AORTA Ao Root diam: 3.30 cm Ao Asc diam:  3.10 cm MITRAL VALVE MV Area (PHT): 3.60 cm    SHUNTS MV Decel Time: 211 msec    Systemic VTI:  0.32 m MV E velocity: 70.30 cm/s  Systemic Diam: 2.00 cm MV A velocity: 85.30 cm/s MV E/A ratio:  0.82 Laurance Flatten MD Electronically signed by Laurance Flatten MD Signature Date/Time: 07/02/2020/1:00:43 PM    Final    MR ORBITS W WO CONTRAST  Result Date: 07/02/2020 CLINICAL DATA:  Monocular vision loss EXAM: MRI OF THE ORBITS WITHOUT AND WITH CONTRAST TECHNIQUE: Multiplanar, multisequence MR imaging of the orbits was performed both before and after the administration of intravenous contrast. CONTRAST:  8mL GADAVIST GADOBUTROL 1 MMOL/ML IV SOLN COMPARISON:  MRI head 07/02/2020 FINDINGS: Orbit: Normal globe. Lens in normal position. Optic nerve normal in signal morphology. Orbital fat normal. Extraocular muscles normal. No mass or abnormal enhancement in the orbit. Sinuses: Clear bilaterally. Skeletal: No focal skeletal lesion. Limited intracranial imaging negative IMPRESSION: Negative MRI orbit with contrast. Electronically Signed   By: Marlan Palau M.D.   On: 07/02/2020 12:59    Time Spent in minutes  30   Susa Raring M.D on 07/03/2020 at 8:37 AM  To page go to [email or web address]

## 2020-07-03 NOTE — Discharge Instructions (Signed)
Follow with Primary MD Center, [facility] in 7 days   Do not drive, operate heavy machinery, perform activities at heights, swimming or participation in water activities or provide baby sitting services until you have seen by Primary MD or a Neurologist and advised to do so again.  Get CBC, CMP, 2 view Chest X ray -  checked next visit within 1 week by Primary MD Activity: As tolerated with Full fall precautions use walker/cane & assistance as needed  Disposition Home    Diet: Heart Healthy    Special Instructions: If you have smoked or chewed Tobacco  in the last 2 yrs please stop smoking, stop any regular Alcohol  and or any Recreational drug use.  On your next visit with your primary care physician please Get Medicines reviewed and adjusted.  Please request your Prim.MD to go over all Hospital Tests and Procedure/Radiological results at the follow up, please get all Hospital records sent to your Prim MD by signing hospital release before you go home.  If you experience worsening of your admission symptoms, develop shortness of breath, life threatening emergency, suicidal or homicidal thoughts you must seek medical attention immediately by calling 911 or calling your MD immediately  if symptoms less severe.  You Must read complete instructions/literature along with all the possible adverse reactions/side effects for all the Medicines you take and that have been prescribed to you. Take any new Medicines after you have completely understood and accpet all the possible adverse reactions/side effects.   Do not drive when taking Pain medications.  Do not take more than prescribed Pain, Sleep and Anxiety Medications

## 2020-07-03 NOTE — Progress Notes (Signed)
LTM D/C'd, no skin breakdown noted

## 2020-07-03 NOTE — Plan of Care (Signed)
Continues testing to r/o cva and return pt to baseline prior to event.

## 2020-07-03 NOTE — Procedures (Addendum)
Patient Name: [name] [name]  MRN: [id number]  Epilepsy Attending: Charlsie Quest  Referring Physician/Provider: Dr Rhea Belton Duration: 07/02/2020 1532 to 07/03/2020 1044  Patient history: 55 year old male presented with episodes of left periorbital headaches, blurred vision in left eye, weakness and paresthesias in place overnight along with migrainous features.  EEG to evaluate for seizures.  Level of alertness: Awake, asleep  AEDs during EEG study: Clonazepam  Technical aspects: This EEG study was done with scalp electrodes positioned according to the 10-20 International system of electrode placement. Electrical activity was acquired at a sampling rate of 500Hz  and reviewed with a high frequency filter of 70Hz  and a low frequency filter of 1Hz . EEG data were recorded continuously and digitally stored.   Description: The posterior dominant rhythm consists of 9 Hz activity of moderate voltage (25-35 uV) seen predominantly in posterior head regions, symmetric and reactive to eye opening and eye closing. Sleep was characterized by vertex waves, sleep spindles (12 to 14 Hz), maximal frontocentral region. Hyperventilation and photic stimulation were not performed.     IMPRESSION: This study is within normal limits. No seizures or epileptiform discharges were seen throughout the recording.  [name] 

## 2020-07-03 NOTE — Progress Notes (Signed)
NEUROLOGY CONSULTATION PROGRESS NOTE   Date of service: July 03, 2020 Patient Name: [name] [name] MRN:  [id number] DOB:  Jan 30, 1966  Brief HPI  [name] [name] is a 55 y.o. male with PMH significant for  has a past medical history of Arthritis, Back pain, Bipolar disorder (HCC), MI (myocardial infarction) (HCC), Polysubstance abuse (HCC), TIA (transient ischemic attack), and Tobacco abuse. who presented to [facility] ER c/o headaches, visual disturbance, and L-sided weakness and paresthesias. The headache preceded the other sx (began a few weeks ago) and the other deficits developed over the past week. He describes the headache as throbbing maximal behind L eye. L eye vision is blurry which is new but he does not endorse field cuts or loss of peripheral vision. No pain on eye movement.  Patient has never had sx before and there was no trigger that he could identify.  Specifically he denies ever having focal neurologic deficits such as decreased vision, pain with eye movement, dizziness, focal weakness, focal numbness prior to the current episode.  Patient also report chest pain.  He was seen in the ER 10 days ago for chest pain.  He reports that he continues to have intermittent episodes of left-sided chest pain radiating to the back.  He has known history of CAD.   Interval Hx   He reports overnight that he has not had any headache episodes. His strength and coordination has been good, being able to walk to the bathroom by himself several times. He reports that the blurriness in his vision is continuing to improve. Vitals   Vitals:   07/02/20 0300 07/02/20 0942 07/02/20 1700 07/02/20 2200  BP: 96/68 116/81 130/85 130/80  Pulse: 78 87 86 73  Resp: 18 15    Temp: 99.6 F (37.6 C) 98.1 F (36.7 C) 98.4 F (36.9 C) 98.5 F (36.9 C)  TempSrc: Oral Oral Oral Oral  SpO2: 97% 99% 99%   Weight:      Height:         Body mass index is 25.33 kg/m.  Physical Exam   General: Laying  comfortably in bed; in no acute distress.  HENT: Normal oropharynx and mucosa. Normal external appearance of ears and nose.  Neck: Supple, no pain or tenderness  CV: No JVD. No peripheral edema.  Pulmonary: Symmetric Chest rise. Normal respiratory effort.  Abdomen: Soft to touch, non-tender.  Ext: No cyanosis, edema, or deformity  Skin: No rash. Normal palpation of skin.   Musculoskeletal: Normal digits and nails by inspection. No clubbing.   Neurologic Examination  Mental status/Cognition: Alert, oriented to self, place, month and year, good attention. Able to follow all commands. Speech/language: Fluent, comprehension intact, object naming intact, repetition intact.  Cranial nerves:   CN II Pupils equal and reactive to light, no VF deficits    CN III,IV,VI EOM intact, no gaze preference or deviation, no nystagmus    CN V normal sensation in V1, V2, and V3 segments bilaterally    CN VII no asymmetry, no nasolabial fold flattening    CN VIII normal hearing to speech    CN IX & X normal palatal elevation, no uvular deviation    CN XI 5/5 head turn and 5/5 shoulder shrug bilaterally    CN XII midline tongue protrusion    Motor:  RUE: 5/5  LUE: 5/5 RLE: 5/5  LLE: 5/5 Grip strength intact bilaterally  Muscle bulk: normal, tone normal, no pronator drift or tremor present   Sensation:  Light touch Intact bilaterally   Pin prick    Temperature    Vibration   Proprioception    Coordination/Complex Motor:  - Finger to Nose intact bilaterally - Heel to shin intact bilaterally - Rapid alternating movement intact bilaterally - Gait: deferred  Labs   Basic Metabolic Panel:  Lab Results  Component Value Date   NA 134 (L) 07/01/2020   K 4.0 07/01/2020   CO2 27 07/01/2020   GLUCOSE 96 07/01/2020   BUN 17 07/01/2020   CREATININE 1.06 07/01/2020   CALCIUM 9.0 07/01/2020   GFRNONAA >60 07/01/2020   GFRAA >60 08/07/2014   HbA1c:  Lab Results  Component Value Date   HGBA1C 5.4  07/02/2020   LDL:  Lab Results  Component Value Date   LDLCALC 144 (H) 07/02/2020   Urine Drug Screen:     Component Value Date/Time   LABOPIA NONE DETECTED 07/01/2020 1357   COCAINSCRNUR NONE DETECTED 07/01/2020 1357   COCAINSCRNUR POSITIVE (A) 08/07/2014 2152   LABBENZ NONE DETECTED 07/01/2020 1357   AMPHETMU NONE DETECTED 07/01/2020 1357   THCU POSITIVE (A) 07/01/2020 1357   LABBARB NONE DETECTED 07/01/2020 1357    Alcohol Level     Component Value Date/Time   ETH <10 07/01/2020 1317   No results found for: PHENYTOIN, ZONISAMIDE, LAMOTRIGINE, LEVETIRACETA No results found for: PHENYTOIN, PHENOBARB, VALPROATE, CBMZ  Imaging and Diagnostic studies  Results for orders placed during the hospital encounter of 07/01/20  ECHOCARDIOGRAM COMPLETE  Narrative ECHOCARDIOGRAM REPORT    Patient Name:   [name] [name] Date of Exam: 07/02/2020 Medical Rec #:  [id number]     Height:       73.0 in Accession #:    [id number]    Weight:       192.0 lb Date of Birth:  Feb 22, 1966     BSA:          2.115 m Patient Age:    54 years      BP:           116/81 mmHg Patient Gender: M             HR:           84 bpm. Exam Location:  Inpatient  Procedure: 2D Echo, Cardiac Doppler and Color Doppler  Indications:    Stroke  History:        Patient has prior history of Echocardiogram examinations, most recent 06/16/2014. Previous Myocardial Infarction and CAD.  Sonographer:    Roosvelt Maser RDCS Referring Phys: [id number] Heloise Beecham [facility]  IMPRESSIONS   1. Left ventricular ejection fraction, by estimation, is 60 to 65%. The left ventricle has normal function. The left ventricle has no regional wall motion abnormalities. Left ventricular diastolic parameters are consistent with Grade I diastolic dysfunction (impaired relaxation). 2. Right ventricular systolic function is normal. The right ventricular size is normal. 3. The mitral valve is grossly normal. Trivial mitral valve regurgitation. No  evidence of mitral stenosis. 4. The aortic valve is tricuspid. There is mild thickening of the aortic valve. Aortic valve regurgitation is not visualized. No aortic stenosis is present. 5. The inferior vena cava is normal in size with greater than 50% respiratory variability, suggesting right atrial pressure of 3 mmHg.  Comparison(s): Compared to prior echo report in 2016, the LVEF is now 60-65% (previously 50-55%).  Conclusion(s)/Recommendation(s): No intracardiac source of embolism detected on this transthoracic study. A transesophageal echocardiogram is recommended to exclude cardiac source of embolism if clinically  indicated.  FINDINGS Left Ventricle: Left ventricular ejection fraction, by estimation, is 60 to 65%. The left ventricle has normal function. The left ventricle has no regional wall motion abnormalities. The left ventricular internal cavity size was normal in size. There is no left ventricular hypertrophy. Left ventricular diastolic parameters are consistent with Grade I diastolic dysfunction (impaired relaxation). Normal left ventricular filling pressure.  Right Ventricle: The right ventricular size is normal. No increase in right ventricular wall thickness. Right ventricular systolic function is normal.  Left Atrium: Left atrial size was normal in size.  Right Atrium: Right atrial size was normal in size.  Pericardium: There is no evidence of pericardial effusion.  Mitral Valve: The mitral valve is grossly normal. There is mild thickening of the mitral valve leaflet(s). Mild mitral annular calcification. Trivial mitral valve regurgitation. No evidence of mitral valve stenosis.  Tricuspid Valve: The tricuspid valve is normal in structure. Tricuspid valve regurgitation is trivial.  Aortic Valve: The aortic valve is tricuspid. There is mild thickening of the aortic valve. Aortic valve regurgitation is not visualized. No aortic stenosis is present. Aortic valve mean gradient  measures 6.0 mmHg. Aortic valve peak gradient measures 12.2 mmHg. Aortic valve area, by VTI measures 3.14 cm.  Pulmonic Valve: The pulmonic valve was not well visualized. Pulmonic valve regurgitation is trivial.  Aorta: The aortic root and ascending aorta are structurally normal, with no evidence of dilitation.  Venous: The inferior vena cava is normal in size with greater than 50% respiratory variability, suggesting right atrial pressure of 3 mmHg.  IAS/Shunts: No atrial level shunt detected by color flow Doppler.   LEFT VENTRICLE PLAX 2D LVIDd:         4.20 cm  Diastology LVIDs:         2.70 cm  LV e' medial:    11.40 cm/s LV PW:         0.70 cm  LV E/e' medial:  6.2 LV IVS:        0.90 cm  LV e' lateral:   14.40 cm/s LVOT diam:     2.00 cm  LV E/e' lateral: 4.9 LV SV:         99 LV SV Index:   47 LVOT Area:     3.14 cm   RIGHT VENTRICLE RV Basal diam:  3.20 cm  LEFT ATRIUM             Index       RIGHT ATRIUM           Index LA diam:        3.10 cm 1.47 cm/m  RA Area:     16.20 cm LA Vol (A2C):   45.7 ml 21.61 ml/m RA Volume:   44.30 ml  20.95 ml/m LA Vol (A4C):   36.7 ml 17.35 ml/m LA Biplane Vol: 44.5 ml 21.04 ml/m AORTIC VALVE AV Area (Vmax):    3.16 cm AV Area (Vmean):   2.90 cm AV Area (VTI):     3.14 cm AV Vmax:           175.00 cm/s AV Vmean:          118.000 cm/s AV VTI:            0.315 m AV Peak Grad:      12.2 mmHg AV Mean Grad:      6.0 mmHg LVOT Vmax:         176.00 cm/s LVOT Vmean:  109.000 cm/s LVOT VTI:          0.315 m LVOT/AV VTI ratio: 1.00  AORTA Ao Root diam: 3.30 cm Ao Asc diam:  3.10 cm  MITRAL VALVE MV Area (PHT): 3.60 cm    SHUNTS MV Decel Time: 211 msec    Systemic VTI:  0.32 m MV E velocity: 70.30 cm/s  Systemic Diam: 2.00 cm MV A velocity: 85.30 cm/s MV E/A ratio:  0.82  Laurance Flatten MD Electronically signed by Laurance Flatten MD Signature Date/Time: 07/02/2020/1:00:43 PM    Final    MR Orbits W  WO Contrast: Negative MRI orbit with contrast.   Impression   [name] [name] is a 55 y.o. male with PMH significant for Arthritis, Back pain, Bipolar disorder (HCC), MI (myocardial infarction) (HCC), Polysubstance abuse (HCC), TIA (transient ischemic attack), and Tobacco abuse. His neurologic examination is notable for near complete resolution of deficits with only some blurry vision remaining. Currently it appears to be complex migraines as the focal symptoms appear with headaches. As all dangerous etiologies have been ruled out the work up can be completed outpatient.  Intended to complete full 24 hours of monitoring to attempt to capture these spells, however the patient self discontinued EEG due to frustration and desire to leave the hospital.  Unfortunately cannot fully rule out seizures at this time given no spell was captured, however it is reassuring that these spells resolved with initiation of continuous EEG monitoring and that there was no epileptic genic activity on EEG through the morning read (full read pending). Given clinical improvement, and negative workup to date, outpatient follow-up is appropriate  Recommendations  -Continue Migraine workup outpatient -Do not recommend Plavix at this time Standard seizure precautions: Per [facility] statutes, patients with seizures are not allowed to drive until  they have been seizure-free for six months. Use caution when using heavy equipment or power tools. Avoid working on ladders or at heights. Take showers instead of baths. Ensure the water temperature is not too high on the home water heater. Do not go swimming alone. When caring for infants or small children, sit down when holding, feeding, or changing them to minimize risk of injury to the child in the event you have a seizure.  To reduce risk of seizures, maintain good sleep hygiene avoid alcohol and illicit drug use, take all anti-seizure medications as prescribed.    ______________________________________________________________________   Arna Snipe MD Resident  Attending Neurologist's note:  I personally saw this patient, gathering history, performing a full neurologic examination, reviewing relevant labs, and formulated the assessment and plan, adding the note above for completeness and clarity to accurately reflect my thoughts  Brooke Dare MD-PhD [facility] [phone] Available 7 AM to 7 PM, outside these hours please contact Neurologist on call listed on AMION

## 2020-07-03 NOTE — Discharge Summary (Signed)
[name] [name] ZOX:[id number] DOB: 04-14-1965 DOA: 07/01/2020  PCP: [facility]  Admit date: 07/01/2020  Discharge date: 07/03/2020  Admitted From: Home   Disposition:  Home   Recommendations for Outpatient Follow-up:   Follow up with PCP in 1-2 weeks  PCP Please obtain BMP/CBC, 2 view CXR in 1week,  (see Discharge instructions)   PCP Please follow up on the following pending results: needs outpt Neuro, Pulm, IR  followup   Home Health: None  Equipment/Devices: None Consultations: Neuro Discharge Condition: Stable  CODE STATUS: Full   Diet Recommendation: Heart Healthy    Diet Order            Diet Heart Room service appropriate? Yes; Fluid consistency: Thin  Diet effective now                  Chief Complaint  Patient presents with  . Headache     Brief history of present illness from the day of admission and additional interim summary    [name] [name] a 55 y.o.malewith medical history significant foramphetamine, cannabis, opiate use, depression, tobacco use, bipolar disorder, presented to the hospital with a 2-week history of left-sided headache, intermittent left-sided blurry vision along with some left arm weakness tingling and numbness.  He initially presented to [facility] and was then transferred to [facility] for further work-up.                                                                   Hospital Course     1.  Left-sided headaches with left-sided intermittent blurry vision and left-sided tingling and numbness mostly in the upper extremity. 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  He would require outpatient IR neurology follow-up for his MCA stenosis monitoring. ? If his episodes were more non organic from anxiety.  2.  History of bipolar disorder with panic attacks.  Continue Vraylar.  3.  Chronic pain and anxiety.  Home medications continued.  4.  Polysubstance abuse.  Counseled to quit all including smoking.  5.  Nonspecific Chest CT scan changes.  4 days of Levaquin thereafter outpatient follow-up with PCP.  Request PCP to recheck CT scan in 3 months.   Discharge diagnosis     Principal Problem:   Blurry vision, left eye Active Problems:   Tobacco abuse    Discharge instructions      Discharge Medications   Allergies as of 07/03/2020      Reactions   Vicodin [hydrocodone-acetaminophen]  Nausea And Vomiting      Medication List    STOP taking these medications   meloxicam 15 MG tablet Commonly known as: MOBIC     TAKE these medications   albuterol 108 (90 Base) MCG/ACT inhaler Commonly known as: VENTOLIN HFA Inhale into the lungs.   aspirin 81 MG EC tablet Take 81 mg by mouth daily.   clonazePAM 1 MG tablet Commonly known as: KLONOPIN Take 1 mg by mouth 4 (four) times daily.   diphenhydrAMINE 25 MG tablet Commonly known as: BENADRYL Take 25 mg by mouth in the morning and at bedtime.   fluticasone 50 MCG/ACT nasal spray Commonly known as: FLONASE Place 2 sprays into both nostrils 2 (two) times daily as needed for allergies or rhinitis.   levofloxacin 750 MG tablet Commonly known as: LEVAQUIN Take 1 tablet (750 mg total) by mouth daily. Start taking on: July 04, 2020   nicotine 21 mg/24hr patch Commonly known as: NICODERM CQ - dosed in mg/24 hours Place 1 patch (21 mg total) onto the skin daily.   Oxycodone HCl 10 MG Tabs Take 10 mg by mouth 3 (three) times daily as needed (for pain).   rosuvastatin 20 MG tablet Commonly known as: CRESTOR Take 1 tablet  (20 mg total) by mouth daily. Start taking on: July 04, 2020   Vraylar capsule Generic drug: cariprazine Take 3 mg by mouth daily.        Follow-up Information    [facility]. Schedule an appointment as soon as possible for a visit in 1 week(s).   Contact information: [address] [place] [address] [phone]        Julieanne Cotton, MD. Schedule an appointment as soon as possible for a visit in 1 week(s).   Specialties: Interventional Radiology, Radiology Why: MCA stenosis Contact information: [address] [place] [address] [phone]        Kalman Shan, MD. Schedule an appointment as soon as possible for a visit in 1 week(s).   Specialty: Pulmonary Disease Why: Chest CT follow up Contact information: [address] [place] [address] [phone]        [facility]. Schedule an appointment as soon as possible for a visit in 1 week(s).   Specialty: Neurology Why: Blurry vision Contact information: [address] [place] [address] [phone]              Major procedures and Radiology Reports - PLEASE review detailed and final reports thoroughly  -       CT Angio Head W or Wo Contrast  Result Date: 07/01/2020 CLINICAL DATA:  Neuro deficit, acute, stroke suspected. Additional history provided: Patient reports headache for 2 weeks and left eye blurry vision for the past 2 days, mid chest pain, pain between shoulder blades. EXAM: CT ANGIOGRAPHY HEAD AND NECK TECHNIQUE: Multidetector CT imaging of the head and neck was performed using the standard protocol during bolus administration of intravenous contrast. Multiplanar CT image reconstructions and MIPs were obtained to evaluate the vascular anatomy. Carotid stenosis measurements (when applicable) are obtained utilizing NASCET criteria, using the distal internal carotid diameter as the denominator. CONTRAST:   OMNIPAQUE IOHEXOL 350 MG/ML SOLN COMPARISON:  Noncontrast head CT 07/01/2020. Brain MRI 06/15/2014. CT angiogram head/neck 06/14/2014. FINDINGS: CTA NECK FINDINGS Aortic arch: Standard aortic branching. Atherosclerotic plaque within the visualized aortic arch and proximal major branch vessels of the neck. No hemodynamically significant innominate or  proximal subclavian artery stenosis. Right carotid system: CCA and ICA patent within the neck without stenosis. No significant atherosclerotic disease. Left carotid system: CCA and ICA patent within the neck without stenosis. Minimal atherosclerotic plaque within the carotid bifurcation. Vertebral arteries: Codominant and patent within the neck without stenosis Skeleton: No acute bony abnormality or aggressive osseous lesion. Cervical spondylosis. Other neck: No neck mass or cervical lymphadenopathy. Upper chest: Reported separately ground-glass opacity within the right lung apex. Paraseptal emphysema. Review of the MIP images confirms the above findings CTA HEAD FINDINGS Anterior circulation: The intracranial internal carotid arteries are patent. The M1 middle cerebral arteries are patent. No M2 proximal branch occlusion. Atherosclerotic irregularity of the M2 and more distal middle cerebral artery branches bilaterally. Most notably, there is a severe stenosis within a distal M2 right middle cerebral artery vessel (series 11, image 16). The anterior cerebral arteries are patent. No intracranial aneurysm is identified. Posterior circulation: The intracranial vertebral arteries are patent. The basilar artery is patent. The posterior cerebral arteries are patent. Posterior communicating arteries are hypoplastic or absent bilaterally. Venous sinuses: Within the limitations of contrast timing, no convincing thrombus. Anatomic variants: As described Review of the MIP images confirms the above findings IMPRESSION: CTA neck: 1. The common carotid, internal carotid and  vertebral arteries are patent within the neck without stenosis. Small atherosclerotic disease within the left carotid bifurcation. 2.  Aortic Atherosclerosis (ICD10-I70.0). 3. Focus of ground-glass opacity within the imaged right lung apex. Please refer to concurrently performed and separately reported CT angiogram of the chest for further description. CTA head: 1. No intracranial large vessel occlusion or proximal high-grade arterial stenosis. 2. Atherosclerotic irregularity of the M2 and more distal middle cerebral artery branches bilaterally. 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 Electronically Signed   By: Jackey Loge DO   On: 07/01/2020 15:00   DG Chest 2 View  Result Date: 06/21/2020 CLINICAL DATA:  55 year old male with chest pain. EXAM: CHEST - 2 VIEW COMPARISON:  Chest radiograph dated 05/10/2020. FINDINGS: No focal consolidation, pleural effusion, pneumothorax. Mild interstitial prominence involving the mid to lower lung field, likely chronic. The cardiac silhouette is within limits. No acute osseous pathology. IMPRESSION: No active cardiopulmonary disease. Electronically Signed   By: Elgie Collard M.D.   On: 06/21/2020 17:18   CT HEAD WO CONTRAST  Result Date: 07/01/2020 CLINICAL DATA:  Suspected stroke. Headache for 2 weeks. LEFT eye blurry vision for 2 days. Chest pain earlier. EXAM: CT HEAD WITHOUT CONTRAST TECHNIQUE: Contiguous axial images were obtained from the base of the skull through the vertex without intravenous contrast. COMPARISON:  06/01/2019 FINDINGS: Brain: No evidence of acute infarction, hemorrhage, hydrocephalus, extra-axial collection or mass lesion/mass effect. Vascular: No hyperdense vessel or unexpected calcification. Skull: Normal. Negative for fracture or focal lesion. Sinuses/Orbits: No acute finding. Other: None. IMPRESSION: Negative exam. Electronically Signed   By: Norva Pavlov M.D.   On: 07/01/2020 13:26   CT  Angio Neck W and/or Wo Contrast  Result Date: 07/01/2020 CLINICAL DATA:  Neuro deficit, acute, stroke suspected. Additional history provided: Patient reports headache for 2 weeks and left eye blurry vision for the past 2 days, mid chest pain, pain between shoulder blades. EXAM: CT ANGIOGRAPHY HEAD AND NECK TECHNIQUE: Multidetector CT imaging of the head and neck was performed using the standard protocol during bolus administration of intravenous contrast. Multiplanar CT image reconstructions and MIPs were obtained to evaluate the vascular anatomy. Carotid stenosis  measurements (when applicable) are obtained utilizing NASCET criteria, using the distal internal carotid diameter as the denominator. CONTRAST:  OMNIPAQUE IOHEXOL 350 MG/ML SOLN COMPARISON:  Noncontrast head CT 07/01/2020. Brain MRI 06/15/2014. CT angiogram head/neck 06/14/2014. FINDINGS: CTA NECK FINDINGS Aortic arch: Standard aortic branching. Atherosclerotic plaque within the visualized aortic arch and proximal major branch vessels of the neck. No hemodynamically significant innominate or proximal subclavian artery stenosis. Right carotid system: CCA and ICA patent within the neck without stenosis. No significant atherosclerotic disease. Left carotid system: CCA and ICA patent within the neck without stenosis. Minimal atherosclerotic plaque within the carotid bifurcation. Vertebral arteries: Codominant and patent within the neck without stenosis Skeleton: No acute bony abnormality or aggressive osseous lesion. Cervical spondylosis. Other neck: No neck mass or cervical lymphadenopathy. Upper chest: Reported separately ground-glass opacity within the right lung apex. Paraseptal emphysema. Review of the MIP images confirms the above findings CTA HEAD FINDINGS Anterior circulation: The intracranial internal carotid arteries are patent. The M1 middle cerebral arteries are patent. No M2 proximal branch occlusion. Atherosclerotic irregularity of the M2  and more distal middle cerebral artery branches bilaterally. Most notably, there is a severe stenosis within a distal M2 right middle cerebral artery vessel (series 11, image 16). The anterior cerebral arteries are patent. No intracranial aneurysm is identified. Posterior circulation: The intracranial vertebral arteries are patent. The basilar artery is patent. The posterior cerebral arteries are patent. Posterior communicating arteries are hypoplastic or absent bilaterally. Venous sinuses: Within the limitations of contrast timing, no convincing thrombus. Anatomic variants: As described Review of the MIP images confirms the above findings IMPRESSION: CTA neck: 1. The common carotid, internal carotid and vertebral arteries are patent within the neck without stenosis. Small atherosclerotic disease within the left carotid bifurcation. 2.  Aortic Atherosclerosis (ICD10-I70.0). 3. Focus of ground-glass opacity within the imaged right lung apex. Please refer to concurrently performed and separately reported CT angiogram of the chest for further description. CTA head: 1. No intracranial large vessel occlusion or proximal high-grade arterial stenosis. 2. Atherosclerotic irregularity of the M2 and more distal middle cerebral artery branches bilaterally. 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 Electronically Signed   By: Jackey Loge DO   On: 07/01/2020 15:00   MR BRAIN WO CONTRAST  Result Date: 07/02/2020 CLINICAL DATA:  Initial evaluation for headache, left-sided visual loss, blurry vision. EXAM: MRI HEAD WITHOUT CONTRAST TECHNIQUE: Multiplanar, multiecho pulse sequences of the brain and surrounding structures were obtained without intravenous contrast. COMPARISON:  Prior CTA from 07/01/2020. FINDINGS: Brain: Cerebral volume within normal limits for patient age. No focal parenchymal signal abnormality identified. No abnormal foci of restricted diffusion to suggest acute or  subacute ischemia. Gray-white matter differentiation well maintained. No encephalomalacia to suggest chronic infarction. No foci of susceptibility artifact to suggest acute or chronic intracranial hemorrhage. No mass lesion, midline shift or mass effect. No hydrocephalus. No extra-axial fluid collection. Major dural sinuses are grossly patent. Pituitary gland and suprasellar region are normal. Midline structures intact and normal. Vascular: Major intracranial vascular flow voids well maintained and normal in appearance. Skull and upper cervical spine: Craniocervical junction normal. Visualized upper cervical spine within normal limits. Bone marrow signal intensity normal. No scalp soft tissue abnormality. Sinuses/Orbits: Globes and orbital soft tissues within normal limits. Paranasal sinuses are largely clear. No significant mastoid effusion. Inner ear structures grossly normal. Other: None. IMPRESSION: Normal brain MRI. No acute intracranial abnormality or findings to explain patient's  symptoms identified. Electronically Signed   By: Rise Mu M.D.   On: 07/02/2020 01:27   Overnight EEG with video  Result Date: 07/03/2020 Charlsie Quest, MD     07/03/2020 12:40 PM Patient Name: [name] [name] MRN: [id number] Epilepsy Attending: Charlsie Quest Referring Physician/Provider: Dr Rhea Belton Duration: 07/02/2020 1532 to 07/03/2020 1044 Patient history: 55 year old male presented with episodes of left periorbital headaches, blurred vision in left eye, weakness and paresthesias in place overnight along with migrainous features.  EEG to evaluate for seizures. Level of alertness: Awake, asleep AEDs during EEG study: Clonazepam Technical aspects: This EEG study was done with scalp electrodes positioned according to the 10-20 International system of electrode placement. Electrical activity was acquired at a sampling rate of 500Hz  and reviewed with a high frequency filter of 70Hz  and a low frequency  filter of 1Hz . EEG data were recorded continuously and digitally stored. Description: The posterior dominant rhythm consists of 9 Hz activity of moderate voltage (25-35 uV) seen predominantly in posterior head regions, symmetric and reactive to eye opening and eye closing. Sleep was characterized by vertex waves, sleep spindles (12 to 14 Hz), maximal frontocentral region. Hyperventilation and photic stimulation were not performed.   IMPRESSION: This study is within normal limits. No seizures or epileptiform discharges were seen throughout the recording.   CT Angio Chest Aorta w/CM &/OR wo/CM  Result Date: 07/01/2020 CLINICAL DATA:  Aortic disease, nontraumatic. Headache for 2 weeks. LEFT eye blurry vision for 2 days. Mid chest pain. EXAM: CT ANGIOGRAPHY CHEST WITH CONTRAST TECHNIQUE: Multidetector CT imaging of the chest was performed using the standard protocol during bolus administration of intravenous contrast. Multiplanar CT image reconstructions and MIPs were obtained to evaluate the vascular anatomy. CONTRAST:  OMNIPAQUE IOHEXOL 350 MG/ML SOLN COMPARISON:  CT angio neck today, CT chest on 10/16/2013 FINDINGS: Cardiovascular: Heart size is normal. No significant coronary artery calcification. No pericardial effusion. Thoracic aorta is well opacified by contrast bolus. There is no aneurysm or dissection. The pulmonary arteries are normal in appearance accounting for contrast bolus timing. Mediastinum/Nodes: The visualized portion of the thyroid gland has a normal appearance. No significant mediastinal, hilar, or axillary adenopathy. The esophagus is unremarkable. Lungs/Pleura: Crescentic ground-glass opacity is identified in the RIGHT lung apex, 2.4 x 1.1 centimeters, as seen on CT of the neck. Similar opacity is identified at the LEFT lung apex, measuring 1.2 x 1.0 centimeters. There are similar patchy areas of airspace filling opacities within the LOWER lobes and central UPPER lobes.  No pleural effusion or consolidation. Paraseptal emphysematous changes are identified in the UPPER lobes. Upper Abdomen: Small circumscribed lesions in the liver measure 1.1 centimeters and smaller. Findings are favored to represent benign cysts or hemangiomas. Gallbladder is present and only partially imaged. Partially imaged intrarenal calculus in the LEFT kidney is 9 millimeters. Musculoskeletal: No chest wall abnormality. No acute osseous findings. Numerous Schmorl's nodes in the midthoracic levels. Review of the MIP images confirms the above findings. IMPRESSION: 1. Technically adequate exam showing no pulmonary embolus. 2. Patchy areas of airspace filling opacities in the lungs, consistent with infectious/inflammatory process. Recommend follow-up CT in 3-6 months. 3. Small hepatic lesions, favored to represent benign cysts or hemangiomas. 4. Partially imaged intrarenal calculus in the LEFT kidney. 5. Aortic Atherosclerosis (ICD10-I70.0) and Emphysema (ICD10-J43.9). Electronically Signed   By: 07/03/2020 M.D.   On: 07/01/2020 15:34   ECHOCARDIOGRAM COMPLETE  Result Date: 07/02/2020    ECHOCARDIOGRAM REPORT  Patient Name:   [name] [name] Date of Exam: 07/02/2020 Medical Rec #:  [id number]     Height:       73.0 in Accession #:    [id number]    Weight:       192.0 lb Date of Birth:  1965/09/25     BSA:          2.115 m Patient Age:    54 years      BP:           116/81 mmHg Patient Gender: M             HR:           84 bpm. Exam Location:  Inpatient Procedure: 2D Echo, Cardiac Doppler and Color Doppler Indications:    Stroke  History:        Patient has prior history of Echocardiogram examinations, most                 recent 06/16/2014. Previous Myocardial Infarction and CAD.  Sonographer:    Roosvelt Maser RDCS Referring Phys: [id number] Heloise Beecham [facility] IMPRESSIONS  1. Left ventricular ejection fraction, by estimation, is 60 to 65%. The left ventricle has normal function. The left ventricle has no regional  wall motion abnormalities. Left ventricular diastolic parameters are consistent with Grade I diastolic dysfunction (impaired relaxation).  2. Right ventricular systolic function is normal. The right ventricular size is normal.  3. The mitral valve is grossly normal. Trivial mitral valve regurgitation. No evidence of mitral stenosis.  4. The aortic valve is tricuspid. There is mild thickening of the aortic valve. Aortic valve regurgitation is not visualized. No aortic stenosis is present.  5. The inferior vena cava is normal in size with greater than 50% respiratory variability, suggesting right atrial pressure of 3 mmHg. Comparison(s): Compared to prior echo report in 2016, the LVEF is now 60-65% (previously 50-55%). Conclusion(s)/Recommendation(s): No intracardiac source of embolism detected on this transthoracic study. A transesophageal echocardiogram is recommended to exclude cardiac source of embolism if clinically indicated. FINDINGS  Left Ventricle: Left ventricular ejection fraction, by estimation, is 60 to 65%. The left ventricle has normal function. The left ventricle has no regional wall motion abnormalities. The left ventricular internal cavity size was normal in size. There is  no left ventricular hypertrophy. Left ventricular diastolic parameters are consistent with Grade I diastolic dysfunction (impaired relaxation). Normal left ventricular filling pressure. Right Ventricle: The right ventricular size is normal. No increase in right ventricular wall thickness. Right ventricular systolic function is normal. Left Atrium: Left atrial size was normal in size. Right Atrium: Right atrial size was normal in size. Pericardium: There is no evidence of pericardial effusion. Mitral Valve: The mitral valve is grossly normal. There is mild thickening of the mitral valve leaflet(s). Mild mitral annular calcification. Trivial mitral valve regurgitation. No evidence of mitral valve stenosis. Tricuspid Valve: The  tricuspid valve is normal in structure. Tricuspid valve regurgitation is trivial. Aortic Valve: The aortic valve is tricuspid. There is mild thickening of the aortic valve. Aortic valve regurgitation is not visualized. No aortic stenosis is present. Aortic valve mean gradient measures 6.0 mmHg. Aortic valve peak gradient measures 12.2  mmHg. Aortic valve area, by VTI measures 3.14 cm. Pulmonic Valve: The pulmonic valve was not well visualized. Pulmonic valve regurgitation is trivial. Aorta: The aortic root and ascending aorta are structurally normal, with no evidence of dilitation. Venous: The inferior vena cava is normal in size with greater  than 50% respiratory variability, suggesting right atrial pressure of 3 mmHg. IAS/Shunts: No atrial level shunt detected by color flow Doppler.  LEFT VENTRICLE PLAX 2D LVIDd:         4.20 cm  Diastology LVIDs:         2.70 cm  LV e' medial:    11.40 cm/s LV PW:         0.70 cm  LV E/e' medial:  6.2 LV IVS:        0.90 cm  LV e' lateral:   14.40 cm/s LVOT diam:     2.00 cm  LV E/e' lateral: 4.9 LV SV:         99 LV SV Index:   47 LVOT Area:     3.14 cm  RIGHT VENTRICLE RV Basal diam:  3.20 cm LEFT ATRIUM             Index       RIGHT ATRIUM           Index LA diam:        3.10 cm 1.47 cm/m  RA Area:     16.20 cm LA Vol (A2C):   45.7 ml 21.61 ml/m RA Volume:   44.30 ml  20.95 ml/m LA Vol (A4C):   36.7 ml 17.35 ml/m LA Biplane Vol: 44.5 ml 21.04 ml/m  AORTIC VALVE AV Area (Vmax):    3.16 cm AV Area (Vmean):   2.90 cm AV Area (VTI):     3.14 cm AV Vmax:           175.00 cm/s AV Vmean:          118.000 cm/s AV VTI:            0.315 m AV Peak Grad:      12.2 mmHg AV Mean Grad:      6.0 mmHg LVOT Vmax:         176.00 cm/s LVOT Vmean:        109.000 cm/s LVOT VTI:          0.315 m LVOT/AV VTI ratio: 1.00  AORTA Ao Root diam: 3.30 cm Ao Asc diam:  3.10 cm MITRAL VALVE MV Area (PHT): 3.60 cm    SHUNTS MV Decel Time: 211 msec    Systemic VTI:  0.32 m MV E velocity: 70.30 cm/s   Systemic Diam: 2.00 cm MV A velocity: 85.30 cm/s MV E/A ratio:  0.82 Laurance FlattenHeather Pemberton MD Electronically signed by Laurance FlattenHeather Pemberton MD Signature Date/Time: 07/02/2020/1:00:43 PM    Final    MR ORBITS W WO CONTRAST  Result Date: 07/02/2020 CLINICAL DATA:  Monocular vision loss EXAM: MRI OF THE ORBITS WITHOUT AND WITH CONTRAST TECHNIQUE: Multiplanar, multisequence MR imaging of the orbits was performed both before and after the administration of intravenous contrast. CONTRAST:  8mL GADAVIST GADOBUTROL 1 MMOL/ML IV SOLN COMPARISON:  MRI head 07/02/2020 FINDINGS: Orbit: Normal globe. Lens in normal position. Optic nerve normal in signal morphology. Orbital fat normal. Extraocular muscles normal. No mass or abnormal enhancement in the orbit. Sinuses: Clear bilaterally. Skeletal: No focal skeletal lesion. Limited intracranial imaging negative IMPRESSION: Negative MRI orbit with contrast. Electronically Signed   By: Marlan Palauharles  Clark M.D.   On: 07/02/2020 12:59    Micro Results     Recent Results (from the past 240 hour(s))  Resp Panel by RT-PCR (Flu A&B, Covid) Nasopharyngeal Swab     Status: None   Collection Time: 07/01/20  2:16 PM   Specimen:  Nasopharyngeal Swab; Nasopharyngeal(NP) swabs in vial transport medium  Result Value Ref Range Status   SARS Coronavirus 2 by RT PCR NEGATIVE NEGATIVE Final    Comment: (NOTE) SARS-CoV-2 target nucleic acids are NOT DETECTED.  The SARS-CoV-2 RNA is generally detectable in upper respiratory specimens during the acute phase of infection. The lowest concentration of SARS-CoV-2 viral copies this assay can detect is 138 copies/mL. A negative result does not preclude SARS-Cov-2 infection and should not be used as the sole basis for treatment or other patient management decisions. A negative result may occur with  improper specimen collection/handling, submission of specimen other than nasopharyngeal swab, presence of viral mutation(s) within the areas targeted by  this assay, and inadequate number of viral copies(<138 copies/mL). A negative result must be combined with clinical observations, patient history, and epidemiological information. The expected result is Negative.  Fact Sheet for Patients:  BloggerCourse.com  Fact Sheet for Healthcare Providers:  SeriousBroker.it  This test is no t yet approved or cleared by the [place] FDA and  has been authorized for detection and/or diagnosis of SARS-CoV-2 by FDA under an Emergency Use Authorization (EUA). This EUA will remain  in effect (meaning this test can be used) for the duration of the COVID-19 declaration under Section 564(b)(1) of the Act, 21 U.S.C.section 360bbb-3(b)(1), unless the authorization is terminated  or revoked sooner.       Influenza A by PCR NEGATIVE NEGATIVE Final   Influenza B by PCR NEGATIVE NEGATIVE Final    Comment: (NOTE) The Xpert Xpress SARS-CoV-2/FLU/RSV plus assay is intended as an aid in the diagnosis of influenza from Nasopharyngeal swab specimens and should not be used as a sole basis for treatment. Nasal washings and aspirates are unacceptable for Xpert Xpress SARS-CoV-2/FLU/RSV testing.  Fact Sheet for Patients: BloggerCourse.com  Fact Sheet for Healthcare Providers: SeriousBroker.it  This test is not yet approved or cleared by the [place] FDA and has been authorized for detection and/or diagnosis of SARS-CoV-2 by FDA under an Emergency Use Authorization (EUA). This EUA will remain in effect (meaning this test can be used) for the duration of the COVID-19 declaration under Section 564(b)(1) of the Act, 21 U.S.C. section 360bbb-3(b)(1), unless the authorization is terminated or revoked.  Performed at [facility], [address] [place], [place] [address]     Today   Subjective    [name] [name] today has no headache,no chest abdominal  pain,no new weakness tingling or numbness, feels much better wants to go home today.    Objective   Blood pressure 123/81, pulse 69, temperature (!) 97.5 F (36.4 C), temperature source Oral, resp. rate 15, height 6\' 1"  (1.854 m), weight 87.1 kg, SpO2 99 %.  No intake or output data in the 24 hours ending 07/03/20 1416  Exam  Awake Alert, No new F.N deficits, Normal affect [place].AT,PERRAL Supple Neck,No JVD, No cervical lymphadenopathy appriciated.  Symmetrical Chest wall movement, Good air movement bilaterally, CTAB RRR,No Gallops,Rubs or new Murmurs, No Parasternal Heave +ve B.Sounds, Abd Soft, Non tender, No organomegaly appriciated, No rebound -guarding or rigidity. No Cyanosis, Clubbing or edema, No new Rash or bruise   Data Review   CBC w Diff:  Lab Results  Component Value Date   WBC 11.2 (H) 07/01/2020   HGB 14.9 07/01/2020   HGB 15.4 06/21/2014   HCT 46.1 07/01/2020   HCT 46.8 06/21/2014   PLT 343 07/01/2020   PLT 239 06/21/2014   LYMPHOPCT 22 07/01/2020   LYMPHOPCT 26.2 03/20/2011  MONOPCT 10 07/01/2020   MONOPCT 12.2 03/20/2011   EOSPCT 3 07/01/2020   EOSPCT 5.1 03/20/2011   BASOPCT 1 07/01/2020   BASOPCT 0.9 03/20/2011    CMP:  Lab Results  Component Value Date   NA 134 (L) 07/01/2020   NA 143 06/21/2014   K 4.0 07/01/2020   K 3.6 06/21/2014   CL 100 07/01/2020   CL 108 06/21/2014   CO2 27 07/01/2020   CO2 27 06/21/2014   BUN 17 07/01/2020   BUN 13 06/21/2014   CREATININE 1.06 07/01/2020   CREATININE 0.94 06/21/2014   PROT 7.4 07/01/2020   PROT 7.4 06/21/2014   ALBUMIN 4.3 07/01/2020   ALBUMIN 4.4 06/21/2014   BILITOT 0.4 07/01/2020   BILITOT 0.3 06/21/2014   ALKPHOS 79 07/01/2020   ALKPHOS 101 06/21/2014   AST 22 07/01/2020   AST 54 (H) 06/21/2014   ALT 25 07/01/2020   ALT 77 (H) 06/21/2014  . Lab Results  Component Value Date   HGBA1C 5.4 07/02/2020   Lab Results  Component Value Date   CHOL 192 07/02/2020   HDL 33 (L) 07/02/2020    LDLCALC 144 (H) 07/02/2020   TRIG 76 07/02/2020   CHOLHDL 5.8 07/02/2020     Total Time in preparing paper work, data evaluation and todays exam - 35 minutes  Susa Raring M.D on 07/03/2020 at 2:16 PM  [facility]

## 2020-07-11 ENCOUNTER — Other Ambulatory Visit (HOSPITAL_COMMUNITY): Payer: Self-pay | Admitting: Neurology

## 2020-07-11 ENCOUNTER — Encounter: Payer: Self-pay | Admitting: Internal Medicine

## 2020-07-11 DIAGNOSIS — I771 Stricture of artery: Secondary | ICD-10-CM

## 2020-07-13 ENCOUNTER — Encounter: Payer: Self-pay | Admitting: Neurology

## 2020-07-13 ENCOUNTER — Telehealth: Payer: Self-pay | Admitting: Neurology

## 2020-07-13 ENCOUNTER — Ambulatory Visit: Payer: MEDICAID | Admitting: Neurology

## 2020-07-13 ENCOUNTER — Ambulatory Visit (HOSPITAL_COMMUNITY): Admission: RE | Admit: 2020-07-13 | Payer: MEDICAID | Source: Ambulatory Visit

## 2020-07-13 NOTE — Telephone Encounter (Signed)
This patient did not show for a new patient appointment today. 

## 2020-07-16 ENCOUNTER — Encounter (HOSPITAL_COMMUNITY): Payer: Self-pay | Admitting: Pharmacy Technician

## 2020-07-16 ENCOUNTER — Emergency Department (HOSPITAL_COMMUNITY)
Admission: EM | Admit: 2020-07-16 | Discharge: 2020-07-16 | Disposition: A | Discharge status: Home / Self Care | Payer: Self-pay | Attending: Emergency Medicine | Admitting: Emergency Medicine

## 2020-07-16 ENCOUNTER — Other Ambulatory Visit: Payer: Self-pay

## 2020-07-16 ENCOUNTER — Emergency Department (HOSPITAL_COMMUNITY): Payer: Self-pay

## 2020-07-16 DIAGNOSIS — R519 Headache, unspecified: Secondary | ICD-10-CM | POA: Insufficient documentation

## 2020-07-16 DIAGNOSIS — R079 Chest pain, unspecified: Secondary | ICD-10-CM | POA: Insufficient documentation

## 2020-07-16 DIAGNOSIS — Z5321 Procedure and treatment not carried out due to patient leaving prior to being seen by health care provider: Secondary | ICD-10-CM | POA: Insufficient documentation

## 2020-07-16 DIAGNOSIS — M79602 Pain in left arm: Secondary | ICD-10-CM | POA: Insufficient documentation

## 2020-07-16 LAB — COMPREHENSIVE METABOLIC PANEL WITH GFR
ALT: 25 U/L (ref 0–44)
AST: 20 U/L (ref 15–41)
Albumin: 4.1 g/dL (ref 3.5–5.0)
Alkaline Phosphatase: 72 U/L (ref 38–126)
Anion gap: 10 (ref 5–15)
BUN: 10 mg/dL (ref 6–20)
CO2: 25 mmol/L (ref 22–32)
Calcium: 9.2 mg/dL (ref 8.9–10.3)
Chloride: 103 mmol/L (ref 98–111)
Creatinine, Ser: 0.91 mg/dL (ref 0.61–1.24)
GFR, Estimated: >60 mL/min (ref >60)
Glucose, Bld: 89 mg/dL (ref 70–99)
Potassium: 3.6 mmol/L (ref 3.5–5.1)
Sodium: 138 mmol/L (ref 135–145)
Total Bilirubin: 0.6 mg/dL (ref 0.3–1.2)
Total Protein: 6.9 g/dL (ref 6.5–8.1)

## 2020-07-16 LAB — CBC WITH DIFFERENTIAL/PLATELET
Abs Immature Granulocytes: 0.03 10*3/uL (ref 0.00–0.07)
Basophils Absolute: 0 10*3/uL (ref 0.0–0.1)
Basophils Relative: 0 %
Eosinophils Absolute: 0.2 10*3/uL (ref 0.0–0.5)
Eosinophils Relative: 2 %
HCT: 44.6 % (ref 39.0–52.0)
Hemoglobin: 14.5 g/dL (ref 13.0–17.0)
Immature Granulocytes: 0 %
Lymphocytes Relative: 32 %
Lymphs Abs: 2.2 10*3/uL (ref 0.7–4.0)
MCH: 29.5 pg (ref 26.0–34.0)
MCHC: 32.5 g/dL (ref 30.0–36.0)
MCV: 90.7 fL (ref 80.0–100.0)
Monocytes Absolute: 0.8 10*3/uL (ref 0.1–1.0)
Monocytes Relative: 11 %
Neutro Abs: 3.7 10*3/uL (ref 1.7–7.7)
Neutrophils Relative %: 55 %
Platelets: 228 10*3/uL (ref 150–400)
RBC: 4.92 MIL/uL (ref 4.22–5.81)
RDW: 13.5 % (ref 11.5–15.5)
WBC: 6.9 10*3/uL (ref 4.0–10.5)
nRBC: 0.3 % — ABNORMAL HIGH (ref 0.0–0.2)

## 2020-07-16 LAB — TROPONIN I (HIGH SENSITIVITY): Troponin I (High Sensitivity): <2 ng/L (ref <18)

## 2020-07-16 NOTE — Progress Notes (Addendum)
NEUROLOGY CONSULTATION NOTE  [name] [name] MRN: [id number] DOB: [birth year]  Referring provider: Roxanne Mins, PA-C Primary care provider: Roxanne Mins, PA-C  Reason for consult:  stroke  Assessment/Plan:   1.  Probable new-onset migraine with aura, without status migrainosus, intractable 2.  Right M2 MCA stenosis - I believe that this is an incidental finding and not cause for his symptoms. 3.  Mood disorder/Bipolar disorder 4.  Coronary artery disease 5.  Hyperlipidemia  Unfortunately, medication options are limited.  He has had adverse reactions to many of the traditional medications used for headache prevention, such as antidepressants and antiepileptic medications.  He is already on a beta blocker.  Due to history of CAD/MI and complicated migraine, triptans are contraindicated.  He already takes oxycodone, so I do not feel comfortable prescribing tramadol.  As he does not have insurance, I cannot readily prescribe a CGRP inhibitor for both preventative and rescue therapy.  1.  I would like to start patient on Emgality.  We will submit application to see if patient meets criteria for [facility] assistance 2.  For headache rescue, he can take oxycodone if that is all available. However, I do not prescribe this medication and would have to continue to be prescribed by his PCP.  Limit use of pain relievers to no more than 2 days out of week to prevent risk of rebound or medication-overuse headache.  Given frequency of headaches, I would avoid Fioricet due to strong potential for rebound headache 3.  Regarding the MCA stenosis, continue ASA 81mg  daily and statin therapy and tobacco cessation 4.  Follow up 6 months.   Subjective:  [name] [name] is a 55 year old right-handed male with CAD, tobacco abuse, Bipolar disorder, polysubstance abuse, arthritis and back pain and history of TIA who presents for stroke.  History supplemented by hospital records and referring provider's notes.   CTA head and neck and MRI brain personally reviewed.  He started having new-onset headaches in March.  He was treated for presumed sinus infection but headaches became worse.  He describes severe left sided pounding headache/left retro-orbital pain, associated with blurred vision in left eye, nausea, diarrhea, bilateral rhinorrhea, and chest pain with pain and paresthesias involving the left arm and leg.  Weakness not true weakness but rather strength limited due to pain and numbness.  Pain is so severe that he feels disoriented.  He has tried Tylenol, ibuprofen, BC, ASA and even oxycodone (which he takes for back pain) which all have been ineffective.  He was admitted to [facility] on 07/01/2020.  CT head was normal.  CTA of head and neck showed atherosclerotic irregularity of M2 and more distal MCA branches bilaterally with severe focal stenosis within distal right M2 MCA branch.  MRI of brain and orbits were negative.  2D echo demonstrated EF 60-65% with no cardiac source of embolus.  Labs demonstrated sed rate 3, CRP, CRP 0.5, LDL 144 and Hgb A1c 5.4.  Overnight EEG was normal.  Complicated migraine was suspected.  He has been having a dull persistent headache with severe fluctuations occurring daily lasting 30 minutes to 2 hours.    Denies prior history of headaches.  Current medications include:  Lisinopril, rosuvastatin, isosorbide, carvedilol, ASA 81mg   He has had side effects to many medications taken for his mood disorder:  Amitriptyline, Effexor, Zoloft, Depakote, topiramate  PAST MEDICAL HISTORY: Past Medical History:  Diagnosis Date  . Arthritis   . Back pain   .  Bipolar disorder (HCC)   . MI (myocardial infarction) (HCC)    at age 37  . Polysubstance abuse (HCC)   . TIA (transient ischemic attack)   . Tobacco abuse     PAST SURGICAL HISTORY: Past Surgical History:  Procedure Laterality Date  . FOOT SURGERY    . PACEMAKER PLACEMENT      MEDICATIONS: Current  Outpatient Medications on File Prior to Visit  Medication Sig Dispense Refill  . albuterol (VENTOLIN HFA) 108 (90 Base) MCG/ACT inhaler Inhale into the lungs.    Marland Kitchen aspirin 81 MG EC tablet Take 81 mg by mouth daily.    . cariprazine (VRAYLAR) capsule Take 3 mg by mouth daily.    . clonazePAM (KLONOPIN) 1 MG tablet Take 1 mg by mouth 4 (four) times daily.    . diphenhydrAMINE (BENADRYL) 25 MG tablet Take 25 mg by mouth in the morning and at bedtime.    . fluticasone (FLONASE) 50 MCG/ACT nasal spray Place 2 sprays into both nostrils 2 (two) times daily as needed for allergies or rhinitis.    Marland Kitchen levofloxacin (LEVAQUIN) 750 MG tablet Take 1 tablet (750 mg total) by mouth daily. 3 tablet 0  . nicotine (NICODERM CQ - DOSED IN MG/24 HOURS) 21 mg/24hr patch Place 1 patch (21 mg total) onto the skin daily. (Patient not taking: Reported on 05/10/2020) 28 patch 0  . Oxycodone HCl 10 MG TABS Take 10 mg by mouth 3 (three) times daily as needed (for pain).    . rosuvastatin (CRESTOR) 20 MG tablet Take 1 tablet (20 mg total) by mouth daily. 30 tablet 0   No current facility-administered medications on file prior to visit.    ALLERGIES: Allergies  Allergen Reactions  . Vicodin [Hydrocodone-Acetaminophen] Nausea And Vomiting    FAMILY HISTORY: Family History  Problem Relation Age of Onset  . Heart failure Mother   . Hypertension Mother   . Stroke Mother     Objective:  Blood pressure 136/81, pulse 86, height 6' (1.829 m), weight 196 lb 9.6 oz (89.2 kg), SpO2 96 %. General: No acute distress.  Patient appears well-groomed.   Head:  Normocephalic/atraumatic Eyes:  fundi examined but not visualized Neck: supple, no paraspinal tenderness, full range of motion Back: No paraspinal tenderness Heart: regular rate and rhythm Lungs: Clear to auscultation bilaterally. Vascular: No carotid bruits. Neurological Exam: Mental status: alert and oriented to person, place, and time, recent and remote memory  intact, fund of knowledge intact, attention and concentration intact, speech fluent and not dysarthric, language intact. Cranial nerves: CN I: not tested CN II: pupils equal, round and reactive to light, visual fields intact CN III, IV, VI:  full range of motion, no nystagmus, no ptosis CN V: reduced left V1-V3 sensation CN VII: upper and lower face symmetric CN VIII: hearing intact CN IX, X: gag intact, uvula midline CN XI: sternocleidomastoid and trapezius muscles intact CN XII: tongue midline Bulk & Tone: normal, no fasciculations. Motor:  muscle strength 5/5 throughout Sensation:  Pinprick, temperature and vibratory sensation intact. Deep Tendon Reflexes:  2+ throughout,  toes downgoing.   Finger to nose testing:  Without dysmetria.   Heel to shin:  Without dysmetria.   Gait:  Normal station and stride.  Romberg negative.    Thank you for allowing me to take part in the care of this patient.  Shon Millet, DO  CC: Roxanne Mins, PA-C

## 2020-07-16 NOTE — ED Triage Notes (Incomplete)
Pt here via pov with reports of headache in his L eye with blurry vision. Pt also c/o chest pain radiating down his L arm. Pt states L arm is also numb since yesterday.

## 2020-07-16 NOTE — ED Provider Notes (Signed)
Emergency Medicine Provider Triage Evaluation Note  [name] [name] 55 y.o. male was evaluated in triage.  Pt complains of vision.  He also reports pain to his chest and bilateral extremities.  Patient was diagnosed with either his eye.  He states since his discharge from the hospital 2 days ago.  He denies any fever, abdominal weakness.  Review of Systems  Positive: Headache, nausea, chest pain Negative: Shortness of breath,  Physical Exam  BP 134/82   Pulse 70   Temp 98.2 F (36.8 C) (Oral)   Resp 18   Ht 5\' 4"  (1.626 m)   Wt 65.8 kg   SpO2 100%   BMI 24.89 kg/m  Gen:   Awake, no distress   HEENT:  Atraumatic  Resp:  Normal effort  Cardiac:  Normal rate 2+ radial pulses bilaterally. Abd:   Nondistended, nontender  MSK:   Moves extremities without difficulty  Neuro:  Speech clear.  5/5 strength bilateral upper and lower extremity.  Medical Decision Making  Medically screening exam initiated at 3:55 AM.  Appropriate orders placed.  Fruth was informed that the remainder of the evaluation will be completed by another provider, this initial triage assessment does not replace that evaluation, and the importance of remaining in the ED until their evaluation is complete.    Clinical Impression  HA, CP   Portions of this note were generated with Dragon dictation software. Dictation errors may occur despite best attempts at proofreading.    Chales Salmon, PA-C 07/16/20 1529    09/15/20, MD 07/17/20 1011

## 2020-07-17 ENCOUNTER — Ambulatory Visit (HOSPITAL_COMMUNITY): Admission: RE | Admit: 2020-07-17 | Payer: MEDICAID | Source: Ambulatory Visit

## 2020-07-17 ENCOUNTER — Telehealth (HOSPITAL_COMMUNITY): Payer: Self-pay

## 2020-07-17 ENCOUNTER — Ambulatory Visit (INDEPENDENT_AMBULATORY_CARE_PROVIDER_SITE_OTHER): Payer: Self-pay | Admitting: Neurology

## 2020-07-17 ENCOUNTER — Other Ambulatory Visit: Payer: Self-pay

## 2020-07-17 ENCOUNTER — Encounter: Payer: Self-pay | Admitting: Neurology

## 2020-07-17 VITALS — BP 136/81 | HR 86 | Ht 72.0 in | Wt 196.6 lb

## 2020-07-17 DIAGNOSIS — I6529 Occlusion and stenosis of unspecified carotid artery: Secondary | ICD-10-CM

## 2020-07-17 DIAGNOSIS — I6601 Occlusion and stenosis of right middle cerebral artery: Secondary | ICD-10-CM

## 2020-07-17 DIAGNOSIS — G43119 Migraine with aura, intractable, without status migrainosus: Secondary | ICD-10-CM

## 2020-07-17 NOTE — Telephone Encounter (Signed)
Called to schedule consult, no answer, vm full. AW  

## 2020-07-17 NOTE — Patient Instructions (Signed)
1.  We will try to get you started on a monthly shot to reduce headache frequency - either Emgality or Aimovig 2.  When you get a headache, may take oxycodone but limit to no more than 2 days out of week to prevent rebound 3.  Limit use of pain relievers to no more than 2 days out of week to prevent risk of rebound or medication-overuse headache. 4.  Keep headache diary 5.  Continue aspirin 81mg  daily and rosuvastatin

## 2020-07-18 ENCOUNTER — Other Ambulatory Visit: Payer: Self-pay

## 2020-07-18 ENCOUNTER — Ambulatory Visit (HOSPITAL_COMMUNITY)
Admission: RE | Admit: 2020-07-18 | Discharge: 2020-07-18 | Disposition: A | Discharge status: Home / Self Care | Payer: MEDICAID | Source: Ambulatory Visit | Attending: Neurology | Admitting: Neurology

## 2020-07-18 DIAGNOSIS — I771 Stricture of artery: Secondary | ICD-10-CM

## 2020-07-19 HISTORY — PX: IR RADIOLOGIST EVAL & MGMT: IMG5224

## 2020-07-23 ENCOUNTER — Other Ambulatory Visit: Payer: Self-pay

## 2020-07-23 ENCOUNTER — Emergency Department (HOSPITAL_COMMUNITY): Payer: Self-pay

## 2020-07-23 ENCOUNTER — Emergency Department (HOSPITAL_COMMUNITY)
Admission: EM | Admit: 2020-07-23 | Discharge: 2020-07-23 | Discharge status: Against Medical Advice | Payer: Self-pay | Attending: Emergency Medicine | Admitting: Emergency Medicine

## 2020-07-23 ENCOUNTER — Telehealth: Payer: Self-pay | Admitting: Neurology

## 2020-07-23 DIAGNOSIS — Z950 Presence of cardiac pacemaker: Secondary | ICD-10-CM | POA: Insufficient documentation

## 2020-07-23 DIAGNOSIS — R0602 Shortness of breath: Secondary | ICD-10-CM | POA: Insufficient documentation

## 2020-07-23 DIAGNOSIS — R079 Chest pain, unspecified: Secondary | ICD-10-CM | POA: Insufficient documentation

## 2020-07-23 DIAGNOSIS — R200 Anesthesia of skin: Secondary | ICD-10-CM

## 2020-07-23 DIAGNOSIS — Z7982 Long term (current) use of aspirin: Secondary | ICD-10-CM | POA: Insufficient documentation

## 2020-07-23 DIAGNOSIS — H538 Other visual disturbances: Secondary | ICD-10-CM | POA: Insufficient documentation

## 2020-07-23 DIAGNOSIS — F17210 Nicotine dependence, cigarettes, uncomplicated: Secondary | ICD-10-CM | POA: Insufficient documentation

## 2020-07-23 DIAGNOSIS — R519 Headache, unspecified: Secondary | ICD-10-CM | POA: Insufficient documentation

## 2020-07-23 LAB — BASIC METABOLIC PANEL
Anion gap: 5 (ref 5–15)
BUN: 11 mg/dL (ref 6–20)
CO2: 23 mmol/L (ref 22–32)
Calcium: 9 mg/dL (ref 8.9–10.3)
Chloride: 110 mmol/L (ref 98–111)
Creatinine, Ser: 0.95 mg/dL (ref 0.61–1.24)
GFR, Estimated: >60 mL/min (ref >60)
Glucose, Bld: 94 mg/dL (ref 70–99)
Potassium: 3.9 mmol/L (ref 3.5–5.1)
Sodium: 138 mmol/L (ref 135–145)

## 2020-07-23 LAB — TROPONIN I (HIGH SENSITIVITY)
Troponin I (High Sensitivity): <2 ng/L (ref <18)
Troponin I (High Sensitivity): <2 ng/L (ref <18)

## 2020-07-23 LAB — CBC
HCT: 45.1 % (ref 39.0–52.0)
Hemoglobin: 14.4 g/dL (ref 13.0–17.0)
MCH: 29.1 pg (ref 26.0–34.0)
MCHC: 31.9 g/dL (ref 30.0–36.0)
MCV: 91.3 fL (ref 80.0–100.0)
Platelets: 235 10*3/uL (ref 150–400)
RBC: 4.94 MIL/uL (ref 4.22–5.81)
RDW: 13.5 % (ref 11.5–15.5)
WBC: 6.8 10*3/uL (ref 4.0–10.5)
nRBC: 0 % (ref 0.0–0.2)

## 2020-07-23 MED ORDER — VALPROATE SODIUM 100 MG/ML IV SOLN
500.0000 mg | Freq: Once | INTRAVENOUS | Status: DC
  Filled 2020-07-23: qty 5

## 2020-07-23 MED ORDER — KETOROLAC TROMETHAMINE 30 MG/ML IJ SOLN
30.0000 mg | Freq: Once | INTRAMUSCULAR | Status: AC
  Administered 2020-07-23: 30 mg via INTRAMUSCULAR
  Filled 2020-07-23: qty 1

## 2020-07-23 MED ORDER — ASPIRIN 81 MG PO CHEW
324.0000 mg | CHEWABLE_TABLET | Freq: Once | ORAL | Status: AC
  Administered 2020-07-23: 324 mg via ORAL
  Filled 2020-07-23: qty 4

## 2020-07-23 MED ORDER — MAGNESIUM SULFATE 2 GM/50ML IV SOLN
2.0000 g | Freq: Once | INTRAVENOUS | Status: AC
  Administered 2020-07-23: 2 g via INTRAVENOUS
  Filled 2020-07-23: qty 50

## 2020-07-23 MED ORDER — PROCHLORPERAZINE EDISYLATE 10 MG/2ML IJ SOLN
10.0000 mg | Freq: Four times a day (QID) | INTRAMUSCULAR | Status: DC | PRN
  Administered 2020-07-23: 10 mg via INTRAVENOUS
  Filled 2020-07-23: qty 2

## 2020-07-23 NOTE — ED Notes (Signed)
Pt requesting for this RN to remove IV and to speak to provider, provider made aware.

## 2020-07-23 NOTE — ED Notes (Signed)
Pt. Stated, he is being ignored. Im blurred in one eye. And I have a migraine headache.

## 2020-07-23 NOTE — Telephone Encounter (Signed)
New message    Patient C/o headache when laying down wake up screaming, haven't slept in three days.   Please advise.

## 2020-07-23 NOTE — ED Notes (Signed)
This RN and Doree Fudge, PA at bedside per pt request to speak to provider and for this RN to remove IV from arm. This RN and PA spoke with pt in regards plan of care, and pending MRI result. Pt stated " I have been here all day with a headache, in which I had no relief, there are no narcotics or anything stronger you can give me for pain." PA Sage and this RN went further in detail to explain and educate pt of use of narcotics, this RN administered multiple medications to aid headache and made provider aware as pt presented complaint to this RN. Pt aware current MRI results have not resulted. Pt expressing want to leave pt states " If I wanted to be here to seeks drugs I would just go back to my car and take my own medications prescribe to me by my doctor." This RN and PA Doree Fudge provided education to pt in regardless to leaving hospital AMA and urged pt to reconsider this ultimate decision. Pt demanded IV to be removed, pt stated " take this out of my arm, I will go to my house and chews on my pain pills, I will drive to [facility] and see them, then drive back to [facility] and sue them like I did before, I've won once, I plan to sue and win again." Pt allowed this RN to bandage IV site post removal, pt took all belongings and walked out of treatment area prior to provider completion of AMA forms. Forms will be signed with another RN at this time.

## 2020-07-23 NOTE — ED Provider Notes (Signed)
[facility] EMERGENCY DEPARTMENT Provider Note   CSN: [id number] Arrival date & time: 07/23/20  1426     History Chief Complaint  Patient presents with  . Chest Pain    [name] [name] is a 55 y.o. male.  HPI   Patient with history of MI, TIA, right cerebral artery stenosis, bipolar, polysubstance abuse, and HTN presents with chest pain and headache. States started 1-2 weeks ago. First noticed headaches that are the worst pain of his life. They last for minutes to hours and are exacerbated when he lays down flat. He also reports associated blurry vision to the left eye. He states the blurry vision is constant. He also experiences chest pain that radiates to his left arm. The pain is sharp, and it comes and goes. He has tried nitroglycerin, but states it makes the headaches worst. He has tried [place], which offers relief from the headaches. He denies any nausea or vomiting. He endorses SOB.   Patient feels that the left side of his body is weaker than normal. This started 1-2 weeks ago.   Past Medical History:  Diagnosis Date  . Arthritis   . Back pain   . Bipolar disorder (HCC)   . MI (myocardial infarction) (HCC)    at age 47  . Polysubstance abuse (HCC)   . TIA (transient ischemic attack)   . Tobacco abuse     Patient Active Problem List   Diagnosis Date Noted  . Blurry vision, left eye 07/01/2020  . Precordial pain 05/10/2020  . Exertional dyspnea   . 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 Cannabis use disorder, severe, dependence (HCC) 08/09/2014  . Opioid use disorder, severe, dependence (HCC) 08/09/2014  . Stimulant use disorder (cocaine use disorder) 08/09/2014  . Tobacco abuse 08/09/2014  . Antisocial personality disorder (HCC) 08/09/2014  . Left-sided weakness 06/14/2014  . Chronic joint pain 06/14/2014    Past Surgical History:   Procedure Laterality Date  . FOOT SURGERY    . IR RADIOLOGIST EVAL & MGMT  07/19/2020  . PACEMAKER PLACEMENT         Family History  Problem Relation Age of Onset  . Heart failure Mother   . Hypertension Mother   . Stroke Mother     Social History   Tobacco Use  . Smoking status: Current Every Day Smoker    Types: Cigarettes  . Smokeless tobacco: Never Used  Substance Use Topics  . Alcohol use: No  . Drug use: Yes    Types: Cocaine, Marijuana, Methamphetamines    Home Medications Prior to Admission medications   Medication Sig Start Date End Date Taking? Authorizing Provider  albuterol (VENTOLIN HFA) 108 (90 Base) MCG/ACT inhaler Inhale into the lungs. 06/14/20 06/14/21  [provider]  aspirin 81 MG EC tablet Take 81 mg by mouth daily.    [provider]  cariprazine (VRAYLAR) capsule Take 3 mg by mouth daily.    [provider]  clonazePAM (KLONOPIN) 1 MG tablet Take 1 mg by mouth 4 (four) times daily.    [provider]  diphenhydrAMINE (BENADRYL) 25 MG tablet Take 25 mg by mouth in the morning and at bedtime.    [provider]  fluticasone (FLONASE) 50 MCG/ACT nasal spray Place 2 sprays into both nostrils 2 (two) times daily as needed for allergies or rhinitis. 12/21/19   [provider]  levofloxacin [facility])  750 MG tablet Take 1 tablet (750 mg total) by mouth daily. 07/04/20   Leroy SeaSingh, Prashant K, MD  nicotine (NICODERM CQ - DOSED IN MG/24 HOURS) 21 mg/24hr patch Place 1 patch (21 mg total) onto the skin daily. Patient not taking: Reported on 05/10/2020 08/10/14   Jimmy FootmanHernandez-Gonzalez, Andrea, MD  Oxycodone HCl 10 MG TABS Take 10 mg by mouth 3 (three) times daily as needed (for pain).    [provider]  rosuvastatin (CRESTOR) 20 MG tablet Take 1 tablet (20 mg total) by mouth daily. 07/04/20   Leroy SeaSingh, Prashant K, MD    Allergies    Vicodin [hydrocodone-acetaminophen]  Review of Systems   Review of Systems   Constitutional: Negative for chills and fever.  HENT: Negative for ear pain and sore throat.   Eyes: Positive for visual disturbance. Negative for photophobia and pain.  Respiratory: Positive for shortness of breath. Negative for cough.   Cardiovascular: Positive for chest pain. Negative for palpitations.  Gastrointestinal: Negative for abdominal pain, nausea and vomiting.  Genitourinary: Negative for dysuria and hematuria.  Musculoskeletal: Negative for arthralgias and back pain.  Skin: Negative for color change and rash.  Neurological: Positive for weakness and headaches. Negative for seizures and syncope.  All other systems reviewed and are negative.   Physical Exam Updated Vital Signs BP (!) 128/105   Pulse 71   Temp 97.9 F (36.6 C) (Oral)   Resp 14   Ht 6' (1.829 m)   Wt 90 kg   SpO2 98%   BMI 26.91 kg/m   Physical Exam Vitals and nursing note reviewed. Exam conducted with a chaperone present.  Constitutional:      Appearance: Normal appearance.     Comments: Sitting upright comfortably. Not in acute distress. Anxious.  HENT:     Head: Normocephalic and atraumatic.  Eyes:     General: Lids are normal. No scleral icterus.       Right eye: No discharge.        Left eye: No discharge.     Extraocular Movements: Extraocular movements intact.     Right eye: Normal extraocular motion and no nystagmus.     Left eye: Normal extraocular motion and no nystagmus.     Conjunctiva/sclera:     Right eye: Right conjunctiva is not injected.     Left eye: Left conjunctiva is not injected.     Pupils: Pupils are equal, round, and reactive to light.  Cardiovascular:     Rate and Rhythm: Normal rate and regular rhythm.     Pulses: Normal pulses.     Heart sounds: Normal heart sounds. No murmur heard. No friction rub. No gallop.   Pulmonary:     Effort: Pulmonary effort is normal. No respiratory distress.     Breath sounds: Normal breath sounds.  Abdominal:     General:  Abdomen is flat. Bowel sounds are normal. There is no distension.     Palpations: Abdomen is soft.     Tenderness: There is no abdominal tenderness.  Skin:    General: Skin is warm and dry.     Coloration: Skin is not jaundiced.  Neurological:     Mental Status: He is alert and oriented to person, place, and time. Mental status is at baseline.     Motor: No tremor.     Coordination: Coordination normal. Finger-Nose-Finger Test normal.     Comments: States left side feels different than right. However, sensation in tact bilaterally.   Psychiatric:  Attention and Perception: Attention and perception normal.        Mood and Affect: Mood is anxious.        Speech: Speech is rapid and pressured.     ED Results / Procedures / Treatments   Labs (all labs ordered are listed, but only abnormal results are displayed) Labs Reviewed  BASIC METABOLIC PANEL  CBC  TROPONIN I (HIGH SENSITIVITY)  TROPONIN I (HIGH SENSITIVITY)    EKG EKG Interpretation  Date/Time:  Monday Jul 23 2020 18:44:28 EDT Ventricular Rate:  69 PR Interval:  153 QRS Duration: 92 QT Interval:  372 QTC Calculation: 399 R Axis:   82 Text Interpretation: Sinus rhythm Normal ECG Confirmed by Eber Hong ([id number]) on 07/23/2020 6:52:37 PM   Radiology DG Chest 2 View  Result Date: 07/23/2020 CLINICAL DATA:  Pt presents to the ED with chest pain with radiation to the left arm for 3 days and nausea. Pt states he has a significant cardiac history.Pt used to smoke 2 packs of cigarettes x day but is now down to 1/2 pack per day. EXAM: CHEST - 2 VIEW COMPARISON:  07/16/2020.  CT, 07/01/2020. FINDINGS: Cardiac silhouette is normal in size. Normal mediastinal and hilar contours. Lungs are clear.  No pleural effusion or pneumothorax. Skeletal structures are intact. IMPRESSION: No active cardiopulmonary disease. Electronically Signed   By: Amie Portland M.D.   On: 07/23/2020 15:24   CT Head Wo Contrast  Result Date:  07/23/2020 CLINICAL DATA:  Headaches EXAM: CT HEAD WITHOUT CONTRAST TECHNIQUE: Contiguous axial images were obtained from the base of the skull through the vertex without intravenous contrast. COMPARISON:  07/01/2020 FINDINGS: Brain: No evidence of acute infarction, hemorrhage, hydrocephalus, extra-axial collection or mass lesion/mass effect. Vascular: No hyperdense vessel or unexpected calcification. Skull: Normal. Negative for fracture or focal lesion. Sinuses/Orbits: No acute finding. Other: None. IMPRESSION: No acute intracranial abnormality noted. Electronically Signed   By: Alcide Clever M.D.   On: 07/23/2020 19:19    Procedures Procedures   Medications Ordered in ED Medications  prochlorperazine (COMPAZINE) injection 10 mg (10 mg Intravenous Given 07/23/20 2039)  aspirin chewable tablet 324 mg (324 mg Oral Given 07/23/20 2008)  ketorolac (TORADOL) 30 MG/ML injection 30 mg (30 mg Intramuscular Given 07/23/20 1941)  magnesium sulfate IVPB 2 g 50 mL (0 g Intravenous Stopped 07/23/20 2235)    ED Course  I have reviewed the triage vital signs and the nursing notes.  Pertinent labs & imaging results that were available during my care of the patient were reviewed by me and considered in my medical decision making (see chart for details).    MDM Rules/Calculators/A&P                          Patient is a 55 year old male presenting for headache and chest pain. Stable vitals. No slurring. Oriented x 3.   Per chart review,  Patient was admitted to the hospital 07/01/20 for similar complaint. Found he had severe focal stenosis within distal right M2 MCA branch. Will consult neurology given current symptoms match distribution of right middle MCA branch.   Labs: CMP, BMP, Troponin all WNL. No signs of infectious process, anemia, or electrolyte derrangement.  CT Head: No acute pathology EKG: NSR without STE or ST depression.  Patient given compazine and toradol for headache pain. Will consult with  neurology for recommendations.  8:47 PM - Neurologist recommends MRI without contrast. Also advised to give  patient magnesium and depakote. Patient states he can't have depakote due to hepatoxicity so did not give at this time.  10:36 PM - Patient demanding opioids. Unruly, yelling at staff stating that we all "give him different answers for the same bullishit because he doesn't have insurance". Informed patient that we can try different headache medication but opioids aren't indicated for headaches. Informed patient that we are still awaiting MRI results and I am unable to explain the source of his headache at this time. Patient is demanding the nurse to remove his IV and states he is going to a "real hospital" and will "sue this place when he gets real answers".   Patient is leaving AMA.   Discussed HPI, physical exam and plan of care for this patient with attending Dr.Brian Hyacinth Meeker. The attending physician evaluated this patient as part of a shared visit and is aware of patient demanding to leave AMA.  Final Clinical Impression(s) / ED Diagnoses Final diagnoses:  None    Rx / DC Orders ED Discharge Orders    None       Theron Arista, Cordelia Poche 07/23/20 2251    Eber Hong, MD 07/24/20 [phone]

## 2020-07-23 NOTE — Telephone Encounter (Signed)
Pt advised. PT has pain in his left arm, Left eye drupe, Left arm sweating. 4 days now.   Per pt he is scared to go to sleep because when he lay down he wake up with a headache.   Please advise.

## 2020-07-23 NOTE — ED Notes (Signed)
Pt co of severe sudden headache post laying flat, pt denies medication administered prior effective, provider made aware.

## 2020-07-23 NOTE — ED Notes (Signed)
Pt returned from MRI at this time

## 2020-07-23 NOTE — Telephone Encounter (Signed)
Then he needs to go to ED

## 2020-07-23 NOTE — ED Triage Notes (Signed)
Pt presents to the ED with chest pain with radiation to the left arm for 3 days and nausea. Pt states he has a significant cardiac history.

## 2020-07-23 NOTE — ED Notes (Signed)
Pt co of headache x3 days reporting lack of sleep x3 days. Pt reports Toradol ineffective for pain, pt prescribed Oxycodone 10mg  PRN tid for pain, provider made aware.

## 2020-07-23 NOTE — Telephone Encounter (Signed)
    Nature - Headache, pain, blurry vision  Location - left side and top of the head, chest, left arm  Duration - 1 week  Onset - gradually  Course -  worse  Aggravated - laying down-waking up screaming,  but a little better when sitting up  Treatment---NSAID   Please advise

## 2020-07-23 NOTE — ED Provider Notes (Signed)
This patient is a 55 year old male, recently admitted to the hospital with an extensive work-up for left-sided numbness and tingling, visual changes, headaches and chest pain.  During the visit he had a negative work-up for cardiac disease, his MRI showed stenosis of his right middle cerebral artery branch but no signs of acute stroke on MRI.  He states that today he has had more severe headache, similar to prior headaches but more severe, also complains of having left-sided symptoms including arm and leg weakness and left-sided numbness.  On exam the patient does have some subtle numbness of the left side, he has no extinction, he has no obvious cranial nerve abnormalities but does claim that he has some diplopia upon looking left.  There is no disconjugate gaze.  Cardiac exam is unremarkable, normal strength in all 4 extremities including grips, the patient has a symmetrical face, normal speech, normal memory.  EKG reviewed and shows no acute findings, will discuss with neurology given his history of right MCA arterial disease, less likely to be acute intracranial hemorrhage given the ongoing headaches, could be atypical migraine as well.  D/w Dr. Amada Jupiter who made recommendations of magnesium and Depakote for the headache and repeat the MRI.  Suspected if the patient is having a true stroke after 3 weeks of ongoing constant numbness that his MRI will be positive.  Ultimately the patient left before his work-up was complete, he was upset that he was not receiving opiates and was very demanding of having narcotic medications.  He appeared to ambulate with a steady gait no obvious left-sided weakness, he was cautioned against leaving AGAINST MEDICAL ADVICE but refused to stay.  Medical screening examination/treatment/procedure(s) were conducted as a shared visit with non-physician practitioner(s) and myself.  I personally evaluated the patient during the encounter.  Clinical Impression:   Final  diagnoses:  Left sided numbness  Left-sided chest pain         Eber Hong, MD 07/24/20 1534

## 2020-07-23 NOTE — Telephone Encounter (Signed)
I can't do anything immediately for his headaches as he has had side effects to multiple medications or insurance would not approve them and does not cover copay cards.  The best option is the oxycodone which I do not prescribe

## 2020-07-23 NOTE — ED Provider Notes (Signed)
Emergency Medicine Provider Triage Evaluation Note  [name] [name] , a 55 y.o. male  was evaluated in triage.  Pt complains of CP radiates down left arm with sweaty left hand, headaches. CP onset 3 days ago, constant, onset while resting, no pain with exertion/improves with exertion.  Also SHOB, denies nausea.  Hx stroke, MI several years ago, no stents.   Review of Systems  Positive: CP, SHOB Negative: vomiting  Physical Exam  There were no vitals taken for this visit. Gen:   Awake, no distress   Resp:  Normal effort  MSK:   Moves extremities without difficulty  Other:    Medical Decision Making  Medically screening exam initiated at 2:32 PM.  Appropriate orders placed.  [name] [name] was informed that the remainder of the evaluation will be completed by another provider, this initial triage assessment does not replace that evaluation, and the importance of remaining in the ED until their evaluation is complete.     Jeannie Fend, PA-C 07/23/20 1437    Bethann Berkshire, MD 07/23/20 1517

## 2020-07-24 NOTE — Telephone Encounter (Signed)
Tried calling pt no answer

## 2020-07-25 NOTE — Telephone Encounter (Signed)
Telephone call to pt, Per pt he was seen at [facility] Pt given  sumatriptan 25 mg to see if this could help. MRI done at the time he thinks. Pt also give imtrex injections. After reviewing Care everywhere pt had a CT of head.   Per pt that not helping as much as he wish. He is still afraid to lay down to sleep. Once he do the headache comes back.    Pt wanted to know what is wrong he do not understand why he has the headache. Pt states he was due for a stress and never got around to it.  Advised pt to see he is due to talk to his PCP to see if he could get the referral resent.   Pt wanted to know if he could Sumatriptan sent into the pharmacy? He wasn't given enough.

## 2020-07-25 NOTE — Telephone Encounter (Signed)
He cannot take Imitrex.  Triptans are absolutely contraindicated as his headaches are associated with stroke-like symptoms.  We can give him samples of Nurtec or Ubrelvy, however, without insurance, I'm not sure how we can sustain that.  Since options are limited, I can refill the Fioricet (butalbital-acetaminophen-caffeine) tablet but only a limited amount (10 tablets a month) due to strong potential for rebound headache

## 2020-07-26 NOTE — Telephone Encounter (Signed)
Pt states the Imitrex works for him. He has been able to sleep after taking this medication. He did wake up with a mild headache but it's not as bad as it was before.    Per pt he went over to [facility] to pick up a sample of medication form his PCP.    Pt was given 2 boxes Nicolasa Ducking, and Tronkendi.  Pt wanted to know if he could get more samples from [place].

## 2020-07-26 NOTE — Telephone Encounter (Signed)
I can't give him samples of Nicolasa Ducking and Trokendi because that is not sustainable if he does not have insurance.  Those are very expensive medications.  My recommendation was to fill out the application for [employer] to see if he can get on Em[employer].  Furthermore, I do not recommend Imitrex or any similar medication in that family as it is contraindicated in migraines presenting with stroke like symptoms (he endorsed left sided numbness and weakness)

## 2020-07-26 NOTE — Telephone Encounter (Signed)
Patient called back in wanting to speak with [facility] again

## 2020-07-30 ENCOUNTER — Encounter: Payer: Self-pay | Admitting: Surgery

## 2020-07-30 ENCOUNTER — Ambulatory Visit (HOSPITAL_COMMUNITY): Payer: MEDICAID

## 2020-07-30 ENCOUNTER — Other Ambulatory Visit: Payer: Self-pay

## 2020-07-30 DIAGNOSIS — I6529 Occlusion and stenosis of unspecified carotid artery: Secondary | ICD-10-CM

## 2020-07-30 NOTE — Telephone Encounter (Signed)
He can make an appointment but I am not going to prescribe the Imitrex.

## 2020-07-31 ENCOUNTER — Ambulatory Visit (INDEPENDENT_AMBULATORY_CARE_PROVIDER_SITE_OTHER): Payer: Self-pay | Admitting: Vascular Surgery

## 2020-07-31 ENCOUNTER — Ambulatory Visit (HOSPITAL_COMMUNITY)
Admission: RE | Admit: 2020-07-31 | Discharge: 2020-07-31 | Disposition: A | Discharge status: Home / Self Care | Payer: Self-pay | Source: Ambulatory Visit | Attending: Vascular Surgery | Admitting: Vascular Surgery

## 2020-07-31 ENCOUNTER — Other Ambulatory Visit: Payer: Self-pay

## 2020-07-31 ENCOUNTER — Encounter: Payer: Self-pay | Admitting: Vascular Surgery

## 2020-07-31 VITALS — BP 129/73 | HR 85 | Temp 97.5°F | Resp 20 | Ht 72.0 in | Wt 192.0 lb

## 2020-07-31 DIAGNOSIS — I6529 Occlusion and stenosis of unspecified carotid artery: Secondary | ICD-10-CM

## 2020-07-31 NOTE — Progress Notes (Signed)
Patient ID: [name] [name], male   DOB: 04/07/1965, 55 y.o.   MRN: [id number]  Reason for Consult: New Patient (Initial Visit)   Referred by Coralee Rud, PA-C  Subjective:     HPI:  [name] [name] is a 55 y.o. male recently admitted with neurologic deficits thought to be secondary to migraine.  He does not have any focal deficits.  He does have pain in his bilateral lower extremities.  He walks without limitation denies any tissue loss or ulceration.  He has never had stroke, TIA or amaurosis.  He does not have any history of heart disease.  Patient is a current every day smoker although he is significantly cut back.  No personal or family history of aneurysm disease.  Neurologic deficits were considered migraine with aura.  He states that he has left hand weakness residual.  He is hopeful to find some medicine to help with his continued headaches.  Past Medical History:  Diagnosis Date  . Arthritis   . Back pain   . Bipolar disorder (HCC)   . MI (myocardial infarction) (HCC)    at age 33  . Polysubstance abuse (HCC)   . TIA (transient ischemic attack)   . Tobacco abuse    Family History  Problem Relation Age of Onset  . Heart failure Mother   . Hypertension Mother   . Stroke Mother    Past Surgical History:  Procedure Laterality Date  . FOOT SURGERY    . IR RADIOLOGIST EVAL & MGMT  07/19/2020  . PACEMAKER PLACEMENT      Short Social History:  Social History   Tobacco Use  . Smoking status: Current Every Day Smoker    Types: Cigarettes  . Smokeless tobacco: Never Used  . Tobacco comment: 2 cigs daily  Substance Use Topics  . Alcohol use: No    Allergies  Allergen Reactions  . Vicodin [Hydrocodone-Acetaminophen] Nausea And Vomiting    Current Outpatient Medications  Medication Sig Dispense Refill  . aspirin 81 MG EC tablet Take 81 mg by mouth daily.    . cariprazine (VRAYLAR) 3 MG capsule Take 3 mg by mouth daily.    . clonazePAM (KLONOPIN) 1 MG tablet Take  1 mg by mouth 4 (four) times daily.    . diphenhydrAMINE (BENADRYL) 25 MG tablet Take 25 mg by mouth in the morning and at bedtime.    . fluticasone (FLONASE) 50 MCG/ACT nasal spray Place 2 sprays into both nostrils 2 (two) times daily as needed for allergies or rhinitis.    Marland Kitchen levofloxacin (LEVAQUIN) 750 MG tablet Take 1 tablet (750 mg total) by mouth daily. 3 tablet 0  . nicotine (NICODERM CQ - DOSED IN MG/24 HOURS) 21 mg/24hr patch Place 1 patch (21 mg total) onto the skin daily. 28 patch 0  . Oxycodone HCl 10 MG TABS Take 10 mg by mouth 3 (three) times daily as needed (for pain).    . rosuvastatin (CRESTOR) 20 MG tablet Take 1 tablet (20 mg total) by mouth daily. 30 tablet 0  . SUMAtriptan (IMITREX) 25 MG tablet Take by mouth.    Marland Kitchen albuterol (VENTOLIN HFA) 108 (90 Base) MCG/ACT inhaler Inhale into the lungs. (Patient not taking: Reported on 07/31/2020)     No current facility-administered medications for this visit.    Review of Systems  Constitutional:  Constitutional negative. HENT: HENT negative.  Eyes: Eyes negative.  Cardiovascular: Cardiovascular negative.  GI: Gastrointestinal negative.  Musculoskeletal: Musculoskeletal negative.  Skin:  Skin negative.  Neurological: Positive for focal weakness and headaches.  Hematologic: Hematologic/lymphatic negative.  Psychiatric: Psychiatric negative.        Objective:  Objective   Vitals:   07/31/20 0930  BP: 129/73  Pulse: 85  Resp: 20  Temp: (!) 97.5 F (36.4 C)  SpO2: 97%  Weight: 192 lb (87.1 kg)  Height: 6' (1.829 m)   Body mass index is 26.04 kg/m.  Physical Exam HENT:     Head: Normocephalic.     Nose:     Comments: Wearing a mask Eyes:     Pupils: Pupils are equal, round, and reactive to light.  Neck:     Vascular: No carotid bruit.  Cardiovascular:     Rate and Rhythm: Normal rate and regular rhythm.     Pulses: Normal pulses.     Heart sounds: Normal heart sounds.  Pulmonary:     Effort: Pulmonary  effort is normal.     Breath sounds: Normal breath sounds.  Abdominal:     General: Abdomen is flat.     Palpations: Abdomen is soft. There is no mass.  Musculoskeletal:        General: No swelling. Normal range of motion.     Right lower leg: No edema.     Left lower leg: No edema.  Skin:    General: Skin is warm and dry.     Capillary Refill: Capillary refill takes less than 2 seconds.  Neurological:     General: No focal deficit present.     Mental Status: He is alert.  Psychiatric:        Mood and Affect: Mood normal.        Behavior: Behavior normal.        Thought Content: Thought content normal.        Judgment: Judgment normal.     Data:  CTA neck:  1. The common carotid, internal carotid and vertebral arteries are patent within the neck without stenosis. Small atherosclerotic disease within the left carotid bifurcation. 2.  Aortic Atherosclerosis (ICD10-I70.0). 3. Focus of ground-glass opacity within the imaged right lung apex. Please refer to concurrently performed and separately reported CT angiogram of the chest for further description.  CTA head:  1. No intracranial large vessel occlusion or proximal high-grade arterial stenosis. 2. Atherosclerotic irregularity of the M2 and more distal middle cerebral artery branches bilaterally. 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  Right Carotid Findings:  +----------+--------+--------+--------+------------------+--------+       PSV cm/sEDV cm/sStenosisPlaque DescriptionComments  +----------+--------+--------+--------+------------------+--------+  CCA Prox 119   29                      +----------+--------+--------+--------+------------------+--------+  CCA Mid  97   24                      +----------+--------+--------+--------+------------------+--------+  CCA Distal76   24                       +----------+--------+--------+--------+------------------+--------+  ICA Prox 54   22   1-39%  heterogenous         +----------+--------+--------+--------+------------------+--------+  ICA Mid  75   28                      +----------+--------+--------+--------+------------------+--------+  ICA Distal77   30                      +----------+--------+--------+--------+------------------+--------+  ECA    85   17                      +----------+--------+--------+--------+------------------+--------+   +----------+--------+-------+----------------+-------------------+       PSV cm/sEDV cmsDescribe    Arm Pressure (mmHG)  +----------+--------+-------+----------------+-------------------+  [id number]       Multiphasic, WNL            +----------+--------+-------+----------------+-------------------+   +---------+--------+--+--------+--+---------+  VertebralPSV cm/s50EDV cm/s19Antegrade  +---------+--------+--+--------+--+---------+      Left Carotid Findings:  +----------+--------+--------+--------+------------------+--------+       PSV cm/sEDV cm/sStenosisPlaque DescriptionComments  +----------+--------+--------+--------+------------------+--------+  CCA Prox 122   24                      +----------+--------+--------+--------+------------------+--------+  CCA Mid  94   27                      +----------+--------+--------+--------+------------------+--------+  CCA Distal81   25                      +----------+--------+--------+--------+------------------+--------+  ICA Prox 63   21   1-39%  heterogenous          +----------+--------+--------+--------+------------------+--------+  ICA Mid  73   36                      +----------+--------+--------+--------+------------------+--------+  ICA Distal75   33                      +----------+--------+--------+--------+------------------+--------+  ECA    79   15                      +----------+--------+--------+--------+------------------+--------+   +----------+--------+--------+----------------+-------------------+       PSV cm/sEDV cm/sDescribe    Arm Pressure (mmHG)  +----------+--------+--------+----------------+-------------------+  [id number]       Multiphasic, WNL            +----------+--------+--------+----------------+-------------------+   +---------+--------+--+--------+--+---------+  VertebralPSV cm/s51EDV cm/s17Antegrade  +---------+--------+--+--------+--+---------+    Summary:  Right Carotid: Velocities in the right ICA are consistent with a 1-39%  stenosis.   Left Carotid: Velocities in the left ICA are consistent with a 1-39%  stenosis.   Vertebrals: Bilateral vertebral arteries demonstrate antegrade flow.  Subclavians: Normal flow hemodynamics were seen in bilateral subclavian        arteries.       Assessment/Plan:     55 year old male with recent neurologic issues related to headache considered migraine with aura.  No stroke by MRI of the head or CT of the head.  Minimal stenosis of his left internal carotid artery with calcification in 1 to 39% stenosis bilaterally.  I recommended smoking cessation.  He has been started on aspirin and a statin.  He can follow-up with me on an as-needed basis.     Maeola Harman MD [facility]

## 2020-08-07 ENCOUNTER — Other Ambulatory Visit: Payer: Self-pay

## 2020-08-07 ENCOUNTER — Ambulatory Visit (INDEPENDENT_AMBULATORY_CARE_PROVIDER_SITE_OTHER): Payer: Self-pay | Admitting: Internal Medicine

## 2020-08-07 ENCOUNTER — Encounter: Payer: Self-pay | Admitting: Internal Medicine

## 2020-08-07 ENCOUNTER — Telehealth: Payer: Self-pay | Admitting: Neurology

## 2020-08-07 VITALS — BP 114/66 | HR 84 | Temp 97.8°F | Ht 70.0 in | Wt 192.8 lb

## 2020-08-07 DIAGNOSIS — F17210 Nicotine dependence, cigarettes, uncomplicated: Secondary | ICD-10-CM | POA: Insufficient documentation

## 2020-08-07 DIAGNOSIS — R0600 Dyspnea, unspecified: Secondary | ICD-10-CM

## 2020-08-07 DIAGNOSIS — R0609 Other forms of dyspnea: Secondary | ICD-10-CM

## 2020-08-07 NOTE — Telephone Encounter (Signed)
Patient called in stating he was not able to afford the prescription for his headaches that was sent in. He is wondering if he could get some samples? He said he needs some relief from his headaches.

## 2020-08-07 NOTE — Assessment & Plan Note (Signed)
Counseled re importance of smoking cessation but did not meet time criteria for separate billing   ° ° °Each maintenance medication was reviewed in detail including emphasizing most importantly the difference between maintenance and prns and under what circumstances the prns are to be triggered using an action plan format where appropriate. ° °Total time for H and P, chart review, counseling,  directly observing portions of ambulatory 02 saturation study/ and generating customized AVS unique to this office visit / same day charting = 30 min  °     °  °       °

## 2020-08-07 NOTE — Telephone Encounter (Signed)
He needs to fill out the [facility] form for [facility] or we can try to get him coverage for Qulipta 60mg  daily.  These medications would be used to try and reduce frequency of headaches.  Unfortunately I am not aware of any rescue medication with a program for people with no insurance.  Furthermore, he cannot take triptans due to stroke-like symptoms associated with his headache.

## 2020-08-07 NOTE — Progress Notes (Signed)
[name] [name], male    DOB: 01/10/1966,   MRN: [id number]   Brief patient profile:  55 yowm active smoker noted abruptly could not make it back from the [facility] to the house has to stop half due to difficulty chest tightness > [place] 05/10/20 since then happens every time     History of Present Illness  08/07/2020  Pulmonary/ 1st office eval/Khiry Pasquariello  Chief Complaint  Patient presents with  . Pulmonary Consult    Referred by Roxanne Mins, PA.  Pt c/o SOB x 2 months. He gets SOB walking to the mailbox and back.   Dyspnea:  75 ft  Cough: no  Sleep: 45 degrees o/w ha  SABA use: none  No obvious day to day or daytime variability or assoc excess/ purulent sputum or mucus plugs or hemoptysis or   subjective wheeze or overt sinus or hb symptoms.   Sleeping as above  without nocturnal  or early am exacerbation  of respiratory  c/o's or need for noct saba. Also denies any obvious fluctuation of symptoms with weather or environmental changes or other aggravating or alleviating factors except as outlined above   No unusual exposure hx or h/o childhood pna/ asthma or knowledge of premature birth.  Current Allergies, Complete Past Medical History, Past Surgical History, Family History, and Social History were reviewed in [employer] record.  ROS  The following are not active complaints unless bolded Hoarseness, sore throat, dysphagia, dental problems, itching, sneezing,  nasal congestion or discharge of excess mucus or purulent secretions, ear ache,   fever, chills, sweats, unintended wt loss or wt gain, classically pleuritic or exertional cp,  orthopnea pnd or arm/hand swelling  or leg swelling, presyncope, palpitations, abdominal pain, anorexia, nausea, vomiting, diarrhea  or change in bowel habits or change in bladder habits, change in stools or change in urine, dysuria, hematuria,  rash, arthralgias, visual complaints, headache, numbness, weakness or ataxia or problems with walking  or coordination,  change in mood or  memory.           Past Medical History:  Diagnosis Date  . Arthritis   . Back pain   . Bipolar disorder (HCC)   . MI (myocardial infarction) (HCC)    at age 55  . Polysubstance abuse (HCC)   . TIA (transient ischemic attack)   . Tobacco abuse     Outpatient Medications Prior to Visit - - NOTE:   Unable to verify as accurately reflecting what pt takes     Medication Sig Dispense Refill  . aspirin 81 MG EC tablet Take 81 mg by mouth daily.    . cariprazine (VRAYLAR) 3 MG capsule Take 3 mg by mouth daily.    . clonazePAM (KLONOPIN) 1 MG tablet Take 1 mg by mouth 4 (four) times daily.    . nicotine (NICODERM CQ - DOSED IN MG/24 HOURS) 21 mg/24hr patch Place 1 patch (21 mg total) onto the skin daily. 28 patch 0  . rosuvastatin (CRESTOR) 20 MG tablet Take 1 tablet (20 mg total) by mouth daily. 30 tablet 0  . SUMAtriptan (IMITREX) 25 MG tablet Take by mouth.    Marland Kitchen albuterol (VENTOLIN HFA) 108 (90 Base) MCG/ACT inhaler Inhale into the lungs. (Patient not taking: Reported on 07/31/2020)    . diphenhydrAMINE (BENADRYL) 25 MG tablet Take 25 mg by mouth in the morning and at bedtime.    . fluticasone (FLONASE) 50 MCG/ACT nasal spray Place 2 sprays into both nostrils  2 (two) times daily as needed for allergies or rhinitis.    Marland Kitchen    3 tablet 0  . Oxycodone HCl 10 MG TABS Take 10 mg by mouth 3 (three) times daily as needed (for pain).        Objective:     BP 114/66 (BP Location: Left Arm, Cuff Size: Normal)   Pulse 84   Temp 97.8 F (36.6 C) (Temporal)   Ht 5\' 10"  (1.778 m)   Wt 192 lb 12.8 oz (87.5 kg)   SpO2 99% Comment: on RA  BMI 27.66 kg/m   SpO2: 99 % (on RA)   amb wm extremely verbose, talked all during exam and walking study in push of speech pattern    HEENT : pt wearing mask not removed for exam due to covid -19 concerns.    NECK :  without JVD/Nodes/TM/ nl carotid upstrokes bilaterally   LUNGS: no acc muscle use,  Nl contour  chest which is clear to A and P bilaterally without cough on insp or exp maneuvers   CV:  RRR  no s3 or murmur or increase in P2, and no edema   ABD:  soft and nontender with nl inspiratory excursion in the supine position. No bruits or organomegaly appreciated, bowel sounds nl  MS:  Nl gait/ ext warm without deformities, calf tenderness, cyanosis or clubbing No obvious joint restrictions   SKIN: warm and dry without lesions    NEURO:  alert, approp, nl sensorium with  no motor or cerebellar deficits apparent.      I personally reviewed images and agree with radiology impression as follows:   Chest CTa 07/01/20 1. Technically adequate exam showing no pulmonary embolus. 2. Patchy areas of airspace filling opacities in the lungs, consistent with infectious/inflammatory process. Recommend follow-up CT in 3-6 months. 3. Small hepatic lesions, favored to represent benign cysts or hemangiomas. 4. Partially imaged intrarenal calculus in the LEFT kidney. 5. Aortic Atherosclerosis (ICD10-I70.0) and Emphysema (ICD10-J43.9).    I personally reviewed images and agree with radiology impression as follows:  CXR:   07/23/20 No active cardiopulmonary disease.    Assessment   DOE (dyspnea on exertion) Onset abrupt 04/2020 walking back from [facility] Active smoker - CTa 07/01/20  -  08/07/2020   Walked RA  3 laps @ approx 238ft each @ moderate pace  stopped due to end of study,  Min sob/ sats 98%  -  08/07/2020 rec Prilosec 20 mg bid ac  - PFTs ordered  The onset of doe and failure to reproduce it in office on no pulmonary medications since onset or variability are all strongly against a resp etiology.  The assoc with atypical cp once cardiac cause has been eliminated strongly favors gerd/ LPR so rec prilosec 20 mg Take 30- 60 min before your first and last meals of the day and f/u with pfts to complete the w/u.     Cigarette smoker Counseled re importance of smoking cessation but did not meet time  criteria for separate billing    Each maintenance medication was reviewed in detail including emphasizing most importantly the difference between maintenance and prns and under what circumstances the prns are to be triggered using an action plan format where appropriate.  Total time for H and P, chart review, counseling,  directly observing portions of ambulatory 02 saturation study/ and generating customized AVS unique to this office visit / same day charting = 30 min  Sandrea Hughs, MD 08/07/2020

## 2020-08-07 NOTE — Telephone Encounter (Signed)
Pt will agree to take the Qulipta. Per pt he will like to have the other two medication as well at the front desk.  Pt demand to have these medication ready fr him in the morning. He needs something to help with the headache.

## 2020-08-07 NOTE — Patient Instructions (Signed)
There is no evidence of a pulmonary limitation to exercise   I do strongly recommend you stop all smoking and return to clinic with full pfts next available slot.

## 2020-08-07 NOTE — Telephone Encounter (Signed)
Patient requests a call back about this.

## 2020-08-07 NOTE — Assessment & Plan Note (Addendum)
Onset abrupt 04/2020 walking back from [facility] Active smoker - CTa 07/01/20  -  08/07/2020   Walked RA  3 laps @ approx 272ft each @ moderate pace  stopped due to end of study,  Min sob/ sats 98%  -  08/07/2020 rec Prilosec 20 mg bid ac  - PFTs ordered  The onset of doe and failure to reproduce it in office on no pulmonary medications since onset or variability are all strongly against a resp etiology.  The assoc with atypical cp once cardiac cause has been eliminated strongly favors gerd/ LPR so rec prilosec 20 mg Take 30- 60 min before your first and last meals of the day and f/u with pfts to complete the w/u.

## 2020-08-10 ENCOUNTER — Institutional Professional Consult (permissible substitution): Payer: Self-pay | Admitting: Internal Medicine

## 2020-08-29 ENCOUNTER — Telehealth (HOSPITAL_COMMUNITY): Payer: Self-pay

## 2020-08-29 NOTE — Telephone Encounter (Signed)
Called to schedule diagnostic angio, no answer, vm full. AW

## 2020-09-03 ENCOUNTER — Encounter: Payer: Self-pay | Admitting: Surgery

## 2020-09-03 ENCOUNTER — Encounter (HOSPITAL_COMMUNITY): Payer: Self-pay

## 2020-09-11 NOTE — Progress Notes (Deleted)
NEUROLOGY FOLLOW UP OFFICE NOTE  [name] [name] [id number]  Assessment/Plan:    Probable new-onset migraine with aura Right M2 MCA stenosis Bipolar disorder Coronary artery disease Hypertension  1.***  Subjective:  [name] [name] is a 55 year old right-handed male with CAD, tobacco abuse, Bipolar disorder, polysubstance abuse, arthritis and back pain and history of TIA who follows up for headache.  UPDATE: Current medications include:  Lisinopril, rosuvastatin, isosorbide, carvedilol, ASA 81mg   Recommended applying for assistance in order to start Emgality.  He didn't want to do it because he didn't think he would qualify.  I then wanted to start him on Qulipta.  ***.  He went to the ED last month and was prescribed sumatriptan, which helped.  However I told him that it was contraindicated due to having stroke-like symptom with his headaches as well as having cerebrovascular disease and CAD.  His PCP gave him samples of ***   HISTORY: He started having new-onset headaches in March.  He was treated for presumed sinus infection but headaches became worse.  He describes severe left sided pounding headache/left retro-orbital pain, associated with blurred vision in left eye, nausea, diarrhea, bilateral rhinorrhea, and chest pain with pain and paresthesias involving the left arm and leg.  Weakness not true weakness but rather strength limited due to pain and numbness.  Pain is so severe that he feels disoriented.  He has tried Tylenol, ibuprofen, BC, ASA and even oxycodone (which he takes for back pain) which all have been ineffective.  He was [facility] to [facility] on 07/01/2020.  CT head was normal.  CTA of head and neck showed atherosclerotic irregularity of M2 and more distal MCA branches bilaterally with severe focal stenosis within distal right M2 MCA branch.  MRI of brain and orbits were negative.  2D echo demonstrated EF 60-65% with no cardiac source of embolus.  Labs  demonstrated sed rate 3, CRP, CRP 0.5, LDL 144 and Hgb A1c 5.4.  Overnight EEG was normal.  Complicated migraine was suspected.  He has been having a dull persistent headache with severe fluctuations occurring daily lasting 30 minutes to 2 hours.     Denies prior history of headaches.    He has had side effects to many medications taken for his mood disorder:  Amitriptyline, Effexor, Zoloft, Depakote, topiramate  PAST MEDICAL HISTORY: Past Medical History:  Diagnosis Date   Arthritis    Back pain    Bipolar disorder (HCC)    MI (myocardial infarction) (HCC)    at age 64   Polysubstance abuse (HCC)    TIA (transient ischemic attack)    Tobacco abuse     MEDICATIONS: Current Outpatient Medications on File Prior to Visit  Medication Sig Dispense Refill   aspirin 81 MG EC tablet Take 81 mg by mouth daily.     cariprazine (VRAYLAR) 3 MG capsule Take 3 mg by mouth daily.     clonazePAM (KLONOPIN) 1 MG tablet Take 1 mg by mouth 4 (four) times daily.     nicotine (NICODERM CQ - DOSED IN MG/24 HOURS) 21 mg/24hr patch Place 1 patch (21 mg total) onto the skin daily. 28 patch 0   rosuvastatin (CRESTOR) 20 MG tablet Take 1 tablet (20 mg total) by mouth daily. 30 tablet 0   SUMAtriptan (IMITREX) 25 MG tablet Take by mouth.     No current facility-administered medications on file prior to visit.    ALLERGIES: Allergies  Allergen Reactions  Vicodin [Hydrocodone-Acetaminophen] Nausea And Vomiting    FAMILY HISTORY: Family History  Problem Relation Age of Onset   Heart failure Mother    Hypertension Mother    Stroke Mother       Objective:  *** General: No acute distress.  Patient appears ***-groomed.   Head:  Normocephalic/atraumatic Eyes:  Fundi examined but not visualized Neck: supple, no paraspinal tenderness, full range of motion Heart:  Regular rate and rhythm Lungs:  Clear to auscultation bilaterally Back: No paraspinal tenderness Neurological Exam: alert and oriented  to person, place, and time. Attention span and concentration intact, recent and remote memory intact, fund of knowledge intact.  Speech fluent and not dysarthric, language intact.  CN II-XII intact. Bulk and tone normal, muscle strength 5/5 throughout.  Sensation to light touch, temperature and vibration intact.  Deep tendon reflexes 2+ throughout, toes downgoing.  Finger to nose and heel to shin testing intact.  Gait normal, Romberg negative.     Shon Millet, DO  CC: ***

## 2020-09-12 ENCOUNTER — Ambulatory Visit: Payer: Self-pay | Admitting: Neurology

## 2020-09-12 ENCOUNTER — Encounter: Payer: Self-pay | Admitting: Neurology

## 2020-09-12 DIAGNOSIS — Z029 Encounter for administrative examinations, unspecified: Secondary | ICD-10-CM

## 2021-01-03 ENCOUNTER — Encounter (HOSPITAL_COMMUNITY): Payer: Self-pay | Admitting: Emergency Medicine

## 2021-01-03 ENCOUNTER — Emergency Department (HOSPITAL_COMMUNITY): Payer: Self-pay

## 2021-01-03 ENCOUNTER — Emergency Department (HOSPITAL_COMMUNITY)
Admission: EM | Admit: 2021-01-03 | Discharge: 2021-01-03 | Disposition: A | Discharge status: Home / Self Care | Payer: Self-pay | Attending: Emergency Medicine | Admitting: Emergency Medicine

## 2021-01-03 ENCOUNTER — Other Ambulatory Visit: Payer: Self-pay

## 2021-01-03 DIAGNOSIS — R5383 Other fatigue: Secondary | ICD-10-CM | POA: Insufficient documentation

## 2021-01-03 DIAGNOSIS — Z950 Presence of cardiac pacemaker: Secondary | ICD-10-CM | POA: Insufficient documentation

## 2021-01-03 DIAGNOSIS — Z20822 Contact with and (suspected) exposure to covid-19: Secondary | ICD-10-CM | POA: Insufficient documentation

## 2021-01-03 DIAGNOSIS — R059 Cough, unspecified: Secondary | ICD-10-CM | POA: Insufficient documentation

## 2021-01-03 DIAGNOSIS — F17210 Nicotine dependence, cigarettes, uncomplicated: Secondary | ICD-10-CM | POA: Insufficient documentation

## 2021-01-03 DIAGNOSIS — R110 Nausea: Secondary | ICD-10-CM | POA: Insufficient documentation

## 2021-01-03 DIAGNOSIS — R519 Headache, unspecified: Secondary | ICD-10-CM | POA: Insufficient documentation

## 2021-01-03 DIAGNOSIS — M549 Dorsalgia, unspecified: Secondary | ICD-10-CM | POA: Insufficient documentation

## 2021-01-03 DIAGNOSIS — R0789 Other chest pain: Secondary | ICD-10-CM | POA: Insufficient documentation

## 2021-01-03 DIAGNOSIS — Z716 Tobacco abuse counseling: Secondary | ICD-10-CM | POA: Insufficient documentation

## 2021-01-03 DIAGNOSIS — Z7982 Long term (current) use of aspirin: Secondary | ICD-10-CM | POA: Insufficient documentation

## 2021-01-03 DIAGNOSIS — R531 Weakness: Secondary | ICD-10-CM | POA: Insufficient documentation

## 2021-01-03 DIAGNOSIS — Z79899 Other long term (current) drug therapy: Secondary | ICD-10-CM | POA: Insufficient documentation

## 2021-01-03 LAB — HEPATIC FUNCTION PANEL
ALT: 24 U/L (ref 0–44)
AST: 22 U/L (ref 15–41)
Albumin: 4.1 g/dL (ref 3.5–5.0)
Alkaline Phosphatase: 86 U/L (ref 38–126)
Bilirubin, Direct: <0.1 mg/dL (ref 0.0–0.2)
Total Bilirubin: 0.5 mg/dL (ref 0.3–1.2)
Total Protein: 7.1 g/dL (ref 6.5–8.1)

## 2021-01-03 LAB — DIFFERENTIAL
Abs Immature Granulocytes: 0.05 10*3/uL (ref 0.00–0.07)
Basophils Absolute: 0.1 10*3/uL (ref 0.0–0.1)
Basophils Relative: 1 %
Eosinophils Absolute: 0.2 10*3/uL (ref 0.0–0.5)
Eosinophils Relative: 2 %
Immature Granulocytes: 1 %
Lymphocytes Relative: 24 %
Lymphs Abs: 1.6 10*3/uL (ref 0.7–4.0)
Monocytes Absolute: 1.2 10*3/uL — ABNORMAL HIGH (ref 0.1–1.0)
Monocytes Relative: 17 %
Neutro Abs: 3.8 10*3/uL (ref 1.7–7.7)
Neutrophils Relative %: 55 %

## 2021-01-03 LAB — CK: Total CK: 59 U/L (ref 49–397)

## 2021-01-03 LAB — TROPONIN I (HIGH SENSITIVITY)
Troponin I (High Sensitivity): <2 ng/L (ref <18)
Troponin I (High Sensitivity): <2 ng/L (ref <18)

## 2021-01-03 LAB — RESP PANEL BY RT-PCR (FLU A&B, COVID) ARPGX2
Influenza A by PCR: NEGATIVE
Influenza B by PCR: NEGATIVE
SARS Coronavirus 2 by RT PCR: NEGATIVE

## 2021-01-03 LAB — BASIC METABOLIC PANEL
Anion gap: 7 (ref 5–15)
BUN: 10 mg/dL (ref 6–20)
CO2: 30 mmol/L (ref 22–32)
Calcium: 9.3 mg/dL (ref 8.9–10.3)
Chloride: 103 mmol/L (ref 98–111)
Creatinine, Ser: 0.86 mg/dL (ref 0.61–1.24)
GFR, Estimated: >60 mL/min (ref >60)
Glucose, Bld: 93 mg/dL (ref 70–99)
Potassium: 3.6 mmol/L (ref 3.5–5.1)
Sodium: 140 mmol/L (ref 135–145)

## 2021-01-03 LAB — CBC
HCT: 49.6 % (ref 39.0–52.0)
Hemoglobin: 16.1 g/dL (ref 13.0–17.0)
MCH: 28.9 pg (ref 26.0–34.0)
MCHC: 32.5 g/dL (ref 30.0–36.0)
MCV: 88.9 fL (ref 80.0–100.0)
Platelets: 292 10*3/uL (ref 150–400)
RBC: 5.58 MIL/uL (ref 4.22–5.81)
RDW: 14.7 % (ref 11.5–15.5)
WBC: 6.8 10*3/uL (ref 4.0–10.5)
nRBC: 0 % (ref 0.0–0.2)

## 2021-01-03 LAB — URINALYSIS, ROUTINE W REFLEX MICROSCOPIC
Bilirubin Urine: NEGATIVE
Glucose, UA: NEGATIVE mg/dL
Hgb urine dipstick: NEGATIVE
Ketones, ur: NEGATIVE mg/dL
Leukocytes,Ua: NEGATIVE
Nitrite: NEGATIVE
Protein, ur: NEGATIVE mg/dL
Specific Gravity, Urine: 1.014 (ref 1.005–1.030)
pH: 7 (ref 5.0–8.0)

## 2021-01-03 MED ORDER — IBUPROFEN 800 MG PO TABS
800.0000 mg | ORAL_TABLET | Freq: Once | ORAL | Status: AC
  Administered 2021-01-03: 800 mg via ORAL
  Filled 2021-01-03: qty 1

## 2021-01-03 MED ORDER — SODIUM CHLORIDE 0.9 % IV BOLUS
1000.0000 mL | Freq: Once | INTRAVENOUS | Status: AC
  Administered 2021-01-03: 1000 mL via INTRAVENOUS

## 2021-01-03 MED ORDER — DIPHENHYDRAMINE HCL 50 MG/ML IJ SOLN
25.0000 mg | Freq: Once | INTRAMUSCULAR | Status: AC
  Administered 2021-01-03: 25 mg via INTRAVENOUS
  Filled 2021-01-03: qty 1

## 2021-01-03 MED ORDER — METOCLOPRAMIDE HCL 5 MG/ML IJ SOLN
5.0000 mg | Freq: Once | INTRAMUSCULAR | Status: AC
  Administered 2021-01-03: 5 mg via INTRAVENOUS
  Filled 2021-01-03: qty 2

## 2021-01-03 MED ORDER — OXYCODONE HCL 5 MG PO TABS
10.0000 mg | ORAL_TABLET | Freq: Once | ORAL | Status: AC
Start: 2021-01-03 — End: 2021-01-03
  Administered 2021-01-03: 10 mg via ORAL
  Filled 2021-01-03: qty 2

## 2021-01-03 MED ORDER — NITROGLYCERIN 0.4 MG SL SUBL: 0.4000 mg | SUBLINGUAL_TABLET | SUBLINGUAL | Status: DC | PRN

## 2021-01-03 MED ORDER — TRAMADOL HCL 50 MG PO TABS: 50.0000 mg | ORAL_TABLET | Freq: Once | ORAL | Status: DC

## 2021-01-03 MED ORDER — ASPIRIN 325 MG PO TABS
325.0000 mg | ORAL_TABLET | Freq: Every day | ORAL | Status: DC
  Administered 2021-01-03: 325 mg via ORAL
  Filled 2021-01-03: qty 1

## 2021-01-03 NOTE — ED Triage Notes (Signed)
Pt complains of intermittent CP and headache that has been going on for a week. Denies taking any nitro. Pt states the pain goes into his left arm and he has bouts of nausea.

## 2021-01-03 NOTE — Discharge Instructions (Addendum)
Please take your home medications as prescribed. Please stop smoking cigarettes.  Follow-up with your neurologist for further treatment.

## 2021-01-03 NOTE — ED Provider Notes (Signed)
[facility] EMERGENCY DEPARTMENT Provider Note   CSN: [id number] Arrival date & time: 01/03/21  1329     History Chief Complaint  Patient presents with   Chest Pain    [name] [name] is a 55 y.o. male.  This is a 55 y.o. male with significant medical history as below, including BPD, MI, polysubstance abuse, tobacco abuse who presents to the ED with complaint of multiple complaints. Chest pain, body aches, weakness, cough, headache.   Chest pain  Location:  sub sternal Duration:  1 week Onset:  gradual Timing:  intermittent Description:  aching, sharp Severity:  mild Exacerbating/Alleviating Factors:  worse with coughing, torso twisting, arm movement Associated Symptoms:  radiation to arms, left arm weakness, cough, headache to left temporal region and intermittent eye tearing, nausea w/o emesis Pertinent Negatives:  no fevers or chills, no IVDU Context: variable medication compliance, tobacco abuse. He has been evaluated for this complaint multiple times in the past, the left sided weakness has been noted in various ER visits, prior MRI to evaluate this.    The history is provided by the patient. No language interpreter was used.  Chest Pain Associated symptoms: back pain, cough, fatigue, headache, nausea and weakness   Associated symptoms: no abdominal pain, no dysphagia, no fever, no palpitations, no shortness of breath and no vomiting       Past Medical History:  Diagnosis Date   Arthritis    Back pain    Bipolar disorder (HCC)    MI (myocardial infarction) ([place])    at age 7   Polysubstance abuse ([place])    TIA (transient ischemic attack)    Tobacco abuse     Patient Active Problem List   Diagnosis Date Noted   Cigarette smoker 08/07/2020   Blurry vision, left eye 07/01/2020   Precordial pain 05/10/2020   DOE (dyspnea on exertion)    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    Amphetamine use disorder, 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   Cannabis use disorder, severe, dependence ([place]) 08/09/2014   Opioid use disorder, severe, dependence ([place]) 08/09/2014   Stimulant use disorder (cocaine use disorder) 08/09/2014   Tobacco abuse 08/09/2014   Antisocial personality disorder ([place]) 08/09/2014   Left-sided weakness 06/14/2014   Chronic joint pain 06/14/2014    Past Surgical History:  Procedure Laterality Date   FOOT SURGERY     IR RADIOLOGIST EVAL & MGMT  07/19/2020   PACEMAKER PLACEMENT         Family History  Problem Relation Age of Onset   Heart failure Mother    Hypertension Mother    Stroke Mother     Social History   Tobacco Use   Smoking status: Every Day    Packs/day: 2.00    Years: 35.00    Pack years: 70.00    Types: Cigarettes   Smokeless tobacco: Never   Tobacco comments:    6 cigs per day 08/07/20//lmr  Vaping Use   Vaping Use: Never used  Substance Use Topics   Alcohol use: No   Drug use: Yes    Types: Cocaine, Marijuana, Methamphetamines    Comment: using only marijuana    Home Medications Prior to Admission medications   Medication Sig Start Date End Date Taking? Authorizing Provider  aspirin 81 MG EC tablet Take 81 mg by mouth daily.   Yes [provider]  cariprazine (VRAYLAR) 3 MG capsule Take 3 mg by mouth daily.   Yes  [provider]  carvedilol (COREG) 3.125 MG tablet Take 3.125 mg by mouth once.   Yes [provider]  clonazePAM (KLONOPIN) 1 MG tablet Take 1 mg by mouth 4 (four) times daily.   Yes [provider]  Oxycodone HCl 10 MG TABS Take 10 mg by mouth daily as needed (pain).   Yes [provider]  nicotine (NICODERM CQ - DOSED IN MG/24 HOURS) 21 mg/24hr patch Place 1 patch (21 mg total) onto the skin daily. Patient not taking: No sig reported 08/10/14   Hildred Priest, MD  rosuvastatin (CRESTOR) 20 MG tablet Take 1 tablet (20 mg total) by mouth daily. Patient not  taking: No sig reported 07/04/20   Thurnell Lose, MD    Allergies    Vicodin [hydrocodone-acetaminophen]  Review of Systems   Review of Systems  Constitutional:  Positive for fatigue. Negative for chills and fever.  HENT:  Negative for facial swelling and trouble swallowing.   Eyes:  Negative for photophobia and visual disturbance.  Respiratory:  Positive for cough. Negative for shortness of breath.   Cardiovascular:  Positive for chest pain. Negative for palpitations.  Gastrointestinal:  Positive for nausea. Negative for abdominal pain and vomiting.  Endocrine: Negative for polydipsia and polyuria.  Genitourinary:  Negative for difficulty urinating and hematuria.  Musculoskeletal:  Positive for arthralgias and back pain. Negative for gait problem and joint swelling.  Skin:  Negative for pallor and rash.  Neurological:  Positive for weakness and headaches. Negative for seizures, syncope and speech difficulty.  Psychiatric/Behavioral:  Negative for agitation and confusion.    Physical Exam Updated Vital Signs BP 124/83   Pulse 80   Temp 98.7 F (37.1 C) (Oral)   Resp 13   SpO2 100%   Physical Exam Vitals and nursing note reviewed.  Constitutional:      General: He is not in acute distress.    Appearance: Normal appearance. He is well-developed.  HENT:     Head: Normocephalic and atraumatic.     Right Ear: External ear normal.     Left Ear: External ear normal.     Mouth/Throat:     Mouth: Mucous membranes are moist.  Eyes:     General: No scleral icterus.    Extraocular Movements: Extraocular movements intact.     Pupils: Pupils are equal, round, and reactive to light.  Cardiovascular:     Rate and Rhythm: Normal rate and regular rhythm.     Pulses: Normal pulses.     Heart sounds: Murmur heard.  Systolic murmur is present.  Pulmonary:     Effort: Pulmonary effort is normal. No respiratory distress.     Breath sounds: Normal breath sounds.  Abdominal:      General: Abdomen is flat.     Palpations: Abdomen is soft.     Tenderness: There is no abdominal tenderness.  Musculoskeletal:        General: Normal range of motion.     Cervical back: Full passive range of motion without pain and normal range of motion.     Right lower leg: No edema.     Left lower leg: No edema.  Skin:    General: Skin is warm and dry.     Capillary Refill: Capillary refill takes less than 2 seconds.  Neurological:     Mental Status: He is alert and oriented to person, place, and time.     GCS: GCS eye subscore is 4. GCS verbal subscore is 5.  GCS motor subscore is 6.     Cranial Nerves: Cranial nerves 2-12 are intact. No facial asymmetry.     Sensory: Sensation is intact.     Motor: Weakness present. No tremor or pronator drift.     Coordination: Coordination is intact. Finger-Nose-Finger Test normal.     Gait: Gait is intact. Gait normal.     Comments: 4/5 strength to LUE compared to RUE. 4/5 to LLE compared to RLE  Psychiatric:        Mood and Affect: Mood normal.        Behavior: Behavior normal.    ED Results / Procedures / Treatments   Labs (all labs ordered are listed, but only abnormal results are displayed) Labs Reviewed  DIFFERENTIAL - Abnormal; Notable for the following components:      Result Value   Monocytes Absolute 1.2 (*)    All other components within normal limits  RESP PANEL BY RT-PCR (FLU A&B, COVID) ARPGX2  BASIC METABOLIC PANEL  CBC  URINALYSIS, ROUTINE W REFLEX MICROSCOPIC  CK  HEPATIC FUNCTION PANEL  TROPONIN I (HIGH SENSITIVITY)  TROPONIN I (HIGH SENSITIVITY)    EKG EKG Interpretation  Date/Time:  Thursday January 03 2021 13:42:16 EDT Ventricular Rate:  96 PR Interval:  140 QRS Duration: 74 QT Interval:  322 QTC Calculation: 406 R Axis:   89 Text Interpretation: Normal sinus rhythm Normal ECG Similar to prior tracing Confirmed by Wynona Dove ([id number]) on 01/03/2021 2:19:48 PM  Radiology DG Chest 2 View  Result Date:  01/03/2021 CLINICAL DATA:  chest pain EXAM: CHEST - 2 VIEW COMPARISON:  Jul 23, 2020. FINDINGS: The heart size and mediastinal contours are within normal limits. Both lungs are clear. No visible pleural effusions or pneumothorax. No acute osseous abnormality. IMPRESSION: No evidence of acute cardiopulmonary disease. Electronically Signed   By: Margaretha Sheffield M.D.   On: 01/03/2021 14:26    Procedures Procedures   Medications Ordered in ED Medications  nitroGLYCERIN (NITROSTAT) SL tablet 0.4 mg (has no administration in time range)  aspirin tablet 325 mg (325 mg Oral Given 01/03/21 1501)  sodium chloride 0.9 % bolus 1,000 mL (1,000 mLs Intravenous New Bag/Given 01/03/21 1501)  metoCLOPramide (REGLAN) injection 5 mg (5 mg Intravenous Given 01/03/21 1504)  diphenhydrAMINE (BENADRYL) injection 25 mg (25 mg Intravenous Given 01/03/21 1502)    ED Course  I have reviewed the triage vital signs and the nursing notes.  Pertinent labs & imaging results that were available during my care of the patient were reviewed by me and considered in my medical decision making (see chart for details).    MDM Rules/Calculators/A&P                           CC: headache, chest pain  This patient complains of a myriad of complaints; this involves an extensive number of treatment options and is a complaint that carries with it a high risk of complications and morbidity. Vital signs were reviewed. Serious etiologies considered.  Record review:   Previous records obtained and reviewed    Work up as above, notable for:    Labs & imaging results that were available during my care of the patient were reviewed by me and considered in my medical decision making.   EKG without acute ischemia, CXR stable, troponin negative, prior echo 4/22 with EF 60-65% with grade 1  diastolic dysfunction; Doubt ACS.   Labs reviewed and are re-assuring.  I  ordered imaging studies which included CXR, MRI brain and I  independently visualized and interpreted imaging which showed CXR was stable. MRI is pending.  Management: Patient with chronic recurrent headaches, body aches, left sided weakness; he has been evaluated for this multiple times. He does not regularly follow up with his PCP or take home medications as prescribed.   He has seen [place]  neurology in the past for his headaches with concern for migraine with aura, he may be having cluster headaches given presentation today, was started on Emgality, advised to take ASA 89m for MCA stenosis which he is no longer taking; he rarely takes his statin and is still smoking cigarettes.    Counseled patient for approximately 4 minutes regarding smoking cessation. Discussed risks of smoking and how they applied and affected their visit here today. Patient not ready to quit at this time, however will follow up with their primary doctor when they are.   CPT code: [id number] intermediate counseling for smoking cessation   Patient does report overall that he is feeling better, he is pending MRI currently. This has been signed out to the incoming physician. Anticipate discharge once MRI is resulted if negative and close o/p f/u with neuro.           This chart was dictated using voice recognition software.  Despite best efforts to proofread,  errors can occur which can change the documentation meaning.  Final Clinical Impression(s) / ED Diagnoses Final diagnoses:  Atypical chest pain  Nonintractable headache, unspecified chronicity pattern, unspecified headache type  Tobacco abuse counseling    Rx / DC Orders ED Discharge Orders     None        GJeanell Sparrow DO 01/03/21 1655

## 2021-01-03 NOTE — ED Provider Notes (Signed)
Patient signed out to me by previous provider. Please refer to their note for full HPI.  Briefly this is a 55 year old male who presented with chronic complaints including headache and chest pain.  Cardiac work-up has been negative, headaches have been chronic, he follows with neurology as an outpatient.  We are pending MRI for work-up completion and reevaluation. Physical Exam  BP 132/75   Pulse 84   Temp 98.7 F (37.1 C) (Oral)   Resp (!) 23   SpO2 100%   Physical Exam Vitals and nursing note reviewed.  Constitutional:      Appearance: Normal appearance.  HENT:     Head: Normocephalic.     Mouth/Throat:     Mouth: Mucous membranes are moist.  Eyes:     Pupils: Pupils are equal, round, and reactive to light.  Cardiovascular:     Rate and Rhythm: Normal rate.  Pulmonary:     Effort: Pulmonary effort is normal. No respiratory distress.  Abdominal:     Palpations: Abdomen is soft.     Tenderness: There is no abdominal tenderness.  Musculoskeletal:     Cervical back: Neck supple.  Skin:    General: Skin is warm.  Neurological:     Mental Status: He is alert and oriented to person, place, and time. Mental status is at baseline.  Psychiatric:        Mood and Affect: Mood normal.    ED Course/Procedures     Procedures  MDM   MRI is negative for any acute findings.  Patient feels improved.  Sounds like patient suffers from cluster headaches, used to be on medications for this but states has not been able to afford it.  He recently has obtained medical insurance and has follow-up as an outpatient with neurology.  Discharged for outpatient follow-up and treatment.  He already has a prescription for oxycodone at home that he can use as needed.  Patient at this time appears safe and stable for discharge and will be treated as an outpatient.  Discharge plan and strict return to ED precautions discussed, patient verbalizes understanding and agreement.       Rozelle Logan,  DO 01/03/21 2128

## 2021-01-03 NOTE — ED Provider Notes (Signed)
Emergency Medicine Provider Triage Evaluation Note  [name] [name] , a 55 y.o. male  was evaluated in triage.  Pt complains of chest pain and left arm pain.  Is been ongoing for a week, its intermittent.  Feels like a sharp stabbing pain, also having a headache with pain that radiates down his neck.  No nausea or vomiting, patient reports shortness of breath when he lays down flat at night.  This all seems to exacerbate his chest pain and arm pain.  Patient has extensive cardiac history, not currently on blood thinners.  Has not tried any nitroglycerin..  Review of Systems  Positive: Chest pain, arm pain, neck pain, headache Negative: Nausea, vomiting  Physical Exam  BP 121/84 (BP Location: Left Arm)   Pulse 99   Temp 98.7 F (37.1 C) (Oral)   Resp (!) 22   SpO2 100%  Gen:   Awake, no distress   Resp:  Normal effort  MSK:   Moves extremities without difficulty  Other:  S1-S2, no pitting edema  Medical Decision Making  Medically screening exam initiated at 1:46 PM.  Appropriate orders placed.  [name] [name] was informed that the remainder of the evaluation will be completed by another provider, this initial triage assessment does not replace that evaluation, and the importance of remaining in the ED until their evaluation is complete.  Chest pain work-up   Theron Arista, PA-C 01/03/21 1348    Sloan Leiter, DO 01/05/21 1244

## 2021-01-17 NOTE — Progress Notes (Deleted)
NEUROLOGY FOLLOW UP OFFICE NOTE  [name] [name] [id number]  Assessment/Plan:   Chronic recurrent headaches, semiology consistent with cluster headache except for unilateral numbness (which may suggest migrainous component) Right M2 MCA stenosis Mood disorder/Bipolar disorder Coronary artery disease Hyperlipidemia  1  Start verapamil 80mg  three times daily 2  ***  Subjective:  [name] [name] is a 55 year old right-handed male with CAD, tobacco abuse, Bipolar disorder, polysubstance abuse, arthritis and back pain and history of TIA who follows up for headache and stroke.  UPDATE: I wanted to start him on Emgality (as may be helpful for both migraine and cluster) but due to financial constraints, he would be unable to afford it.  He did not have insurance at that time.  Recommended applying for [employer] assistance.  He did not do this.  I told him not to take triptans due to cerebrovascular disease and headaches associated with stroke-like symptoms.  His PCP apparently gave him samples of medications such as Lajean Silvius and Trokendi, but without insurance it wouldn't be sustainable to continue them.  ***.  Most recently seen in ED on 10/20, where MRI of brain personally reviewed was negative for acute findings.  EKG was normal.  Current medications include:  Oxycodone, Lisinopril, rosuvastatin, isosorbide, carvedilol, ASA 81mg   Caffeine:  *** Alcohol:  *** Smoker:  *** Diet:  *** Exercise:  *** Depression:  ***; Anxiety:  *** Other pain:  *** Sleep hygiene:  ***  HISTORY:  He started having new-onset headaches in March 2022.  He was treated for presumed sinus infection but headaches became worse.  He describes severe left sided pounding headache/left retro-orbital pain, associated with blurred vision in left eye, nausea, diarrhea, bilateral rhinorrhea, and chest pain with pain and paresthesias involving the left arm and leg.  Weakness not true weakness but rather strength  limited due to pain and numbness.  Pain is so severe that he feels disoriented.  He has tried Tylenol, ibuprofen, BC, ASA and even oxycodone (which he takes for back pain) which all have been ineffective.  He was [facility] to [facility].  CT head was normal.  CTA of head and neck showed atherosclerotic irregularity of M2 and more distal MCA branches bilaterally with severe focal stenosis within distal right M2 MCA branch.  MRI of brain and orbits were negative.  2D echo demonstrated EF 60-65% with no cardiac source of embolus.  Labs demonstrated sed rate 3, CRP, CRP 0.5, LDL 144 and Hgb A1c 5.4.  Overnight EEG was normal.  Complicated migraine was suspected.  He has been having a dull persistent headache with severe fluctuations occurring daily lasting 30 minutes to 2 hours.     Denies prior history of headaches.   He has had side effects to many medications taken for his mood disorder:  Amitriptyline, Effexor, Zoloft, Depakote, topiramate  PAST MEDICAL HISTORY: Past Medical History:  Diagnosis Date   Arthritis    Back pain    Bipolar disorder ([place])    MI (myocardial infarction) ([place])    at age 86   Polysubstance abuse ([place])    TIA (transient ischemic attack)    Tobacco abuse     MEDICATIONS: Current Outpatient Medications on File Prior to Visit  Medication Sig Dispense Refill   aspirin 81 MG EC tablet Take 81 mg by mouth daily.     cariprazine (VRAYLAR) 3 MG capsule Take 3 mg by mouth daily.     carvedilol (COREG) 3.125  MG tablet Take 3.125 mg by mouth once.     clonazePAM (KLONOPIN) 1 MG tablet Take 1 mg by mouth 4 (four) times daily.     nicotine (NICODERM CQ - DOSED IN MG/24 HOURS) 21 mg/24hr patch Place 1 patch (21 mg total) onto the skin daily. (Patient not taking: No sig reported) 28 patch 0   Oxycodone HCl 10 MG TABS Take 10 mg by mouth daily as needed (pain).     rosuvastatin (CRESTOR) 20 MG tablet Take 1 tablet (20 mg total) by mouth daily. (Patient not  taking: No sig reported) 30 tablet 0   No current facility-administered medications on file prior to visit.    ALLERGIES: Allergies  Allergen Reactions   Vicodin [Hydrocodone-Acetaminophen] Nausea And Vomiting    FAMILY HISTORY: Family History  Problem Relation Age of Onset   Heart failure Mother    Hypertension Mother    Stroke Mother       Objective:  *** General: No acute distress.  Patient appears ***-groomed.   Head:  Normocephalic/atraumatic Eyes:  Fundi examined but not visualized Neck: supple, no paraspinal tenderness, full range of motion Heart:  Regular rate and rhythm Lungs:  Clear to auscultation bilaterally Back: No paraspinal tenderness Neurological Exam: alert and oriented to person, place, and time.  Speech fluent and not dysarthric, language intact.  CN II-XII intact. Bulk and tone normal, muscle strength 5/5 throughout.  Sensation to light touch intact.  Deep tendon reflexes 2+ throughout, toes downgoing.  Finger to nose testing intact.  Gait normal, Romberg negative.   Shon Millet, DO  CC: Roxanne Mins, PA-C

## 2021-01-18 ENCOUNTER — Ambulatory Visit: Payer: Self-pay | Admitting: Neurology

## 2021-04-04 ENCOUNTER — Emergency Department: Payer: No Typology Code available for payment source

## 2021-04-04 ENCOUNTER — Emergency Department
Admission: EM | Admit: 2021-04-04 | Discharge: 2021-04-04 | Disposition: A | Discharge status: Home / Self Care | Payer: No Typology Code available for payment source | Attending: Emergency Medicine | Admitting: Emergency Medicine

## 2021-04-04 ENCOUNTER — Other Ambulatory Visit: Payer: Self-pay

## 2021-04-04 DIAGNOSIS — Y92410 Unspecified street and highway as the place of occurrence of the external cause: Secondary | ICD-10-CM | POA: Insufficient documentation

## 2021-04-04 DIAGNOSIS — S20219A Contusion of unspecified front wall of thorax, initial encounter: Secondary | ICD-10-CM | POA: Insufficient documentation

## 2021-04-04 DIAGNOSIS — S20212A Contusion of left front wall of thorax, initial encounter: Secondary | ICD-10-CM

## 2021-04-04 DIAGNOSIS — M25562 Pain in left knee: Secondary | ICD-10-CM | POA: Insufficient documentation

## 2021-04-04 DIAGNOSIS — S0990XA Unspecified injury of head, initial encounter: Secondary | ICD-10-CM | POA: Diagnosis not present

## 2021-04-04 DIAGNOSIS — S299XXA Unspecified injury of thorax, initial encounter: Secondary | ICD-10-CM | POA: Diagnosis present

## 2021-04-04 MED ORDER — OXYCODONE HCL 5 MG PO TABS
5.0000 mg | ORAL_TABLET | Freq: Once | ORAL | Status: AC
  Administered 2021-04-04: 5 mg via ORAL
  Filled 2021-04-04: qty 1
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
## 2021-04-04 NOTE — Discharge Instructions (Addendum)
Follow-up with your primary care provider if any continued problems or not improving.  Records indicate that she have enough pain medication at home to last till the end of January.  At that time you should follow-up with your primary care provider as it appears that your pain medication is a continuous prescription that gets refilled once a month.  You will be sore for approximately 5 to 7 days.  Is important that she get up and walk around.  You may use ice or heat to your muscles as needed for discomfort.

## 2021-08-06 IMAGING — CT CT HEAD W/O CM
4 series · 17 of 47 positions shown, 19 images · non-contrast
Comparison: 07/01/2020

CLINICAL DATA: Headaches

EXAM:
CT HEAD WITHOUT CONTRAST
TECHNIQUE: Contiguous axial images were obtained from the base of the skull
through the vertex without intravenous contrast.

[Series 3: head wo · axial · 0.50mm/px · z∈[+1372,+1486]mm · 7 of 31 slices shown, 9 images]
[im 4/31  brain]
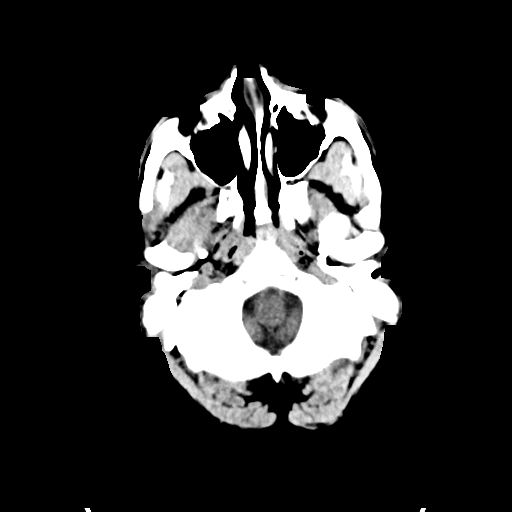
[im 4/31  bone]
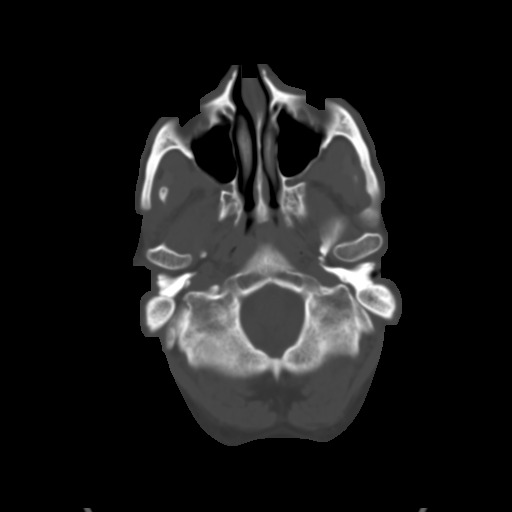
[im 8/31  brain]
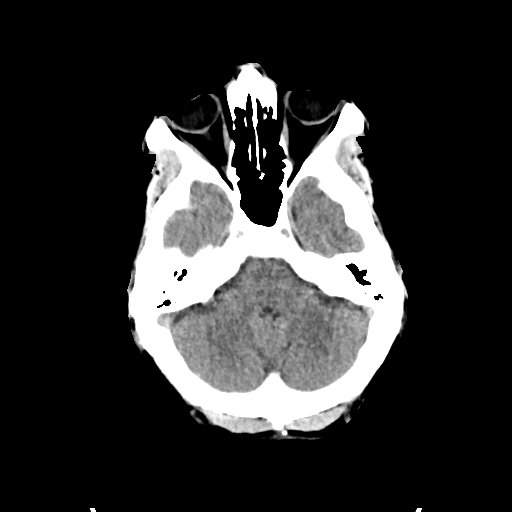
[im 12/31  brain]
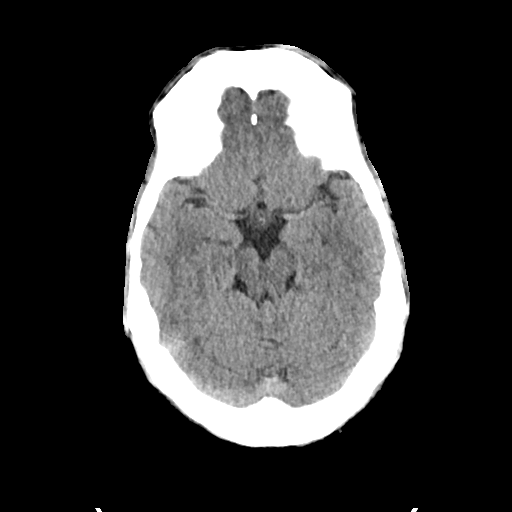
[im 16/31  brain]
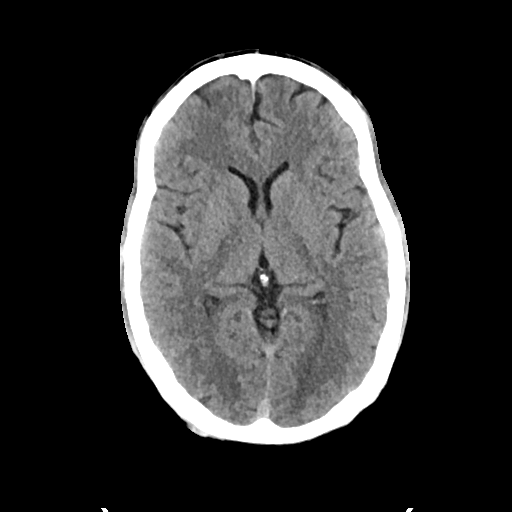
[im 19/31  brain]
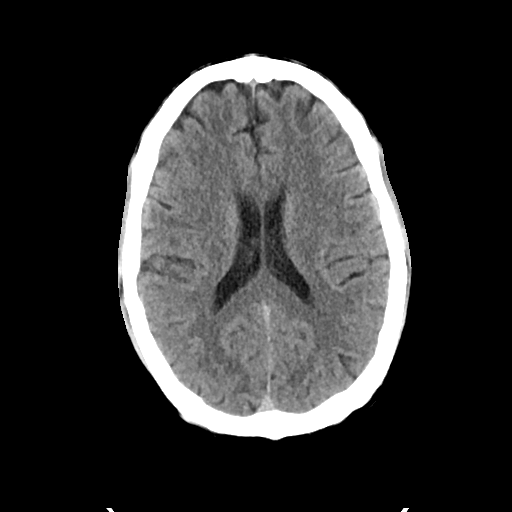
[im 19/31  bone]
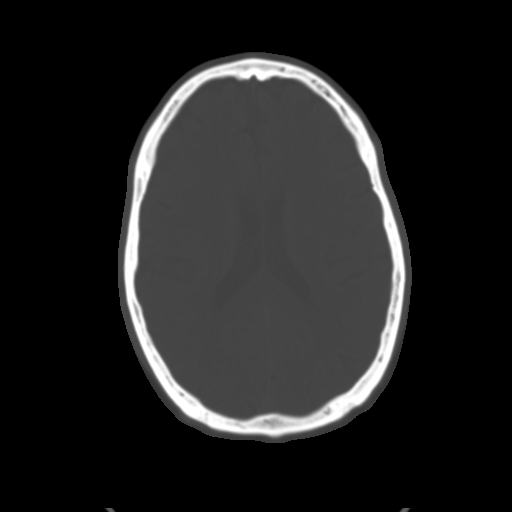
[im 23/31  brain]
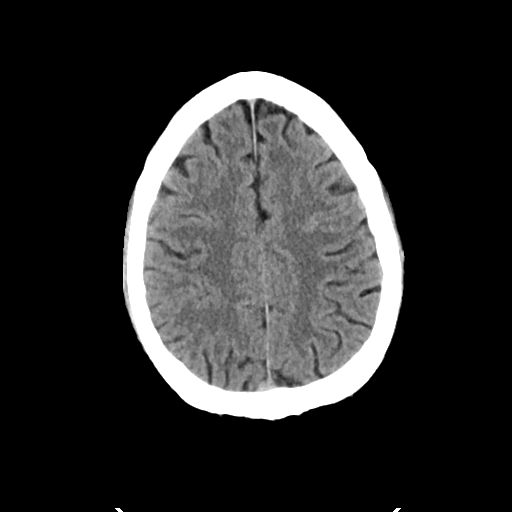
[im 27/31  brain]
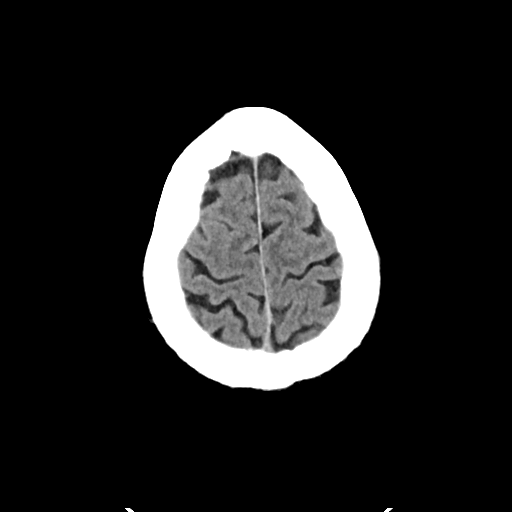

[Series 4: head bone · axial · 0.50mm/px · z∈[+1370,+1424]mm · 4 of 78 slices shown]
[im 8/78  bone]
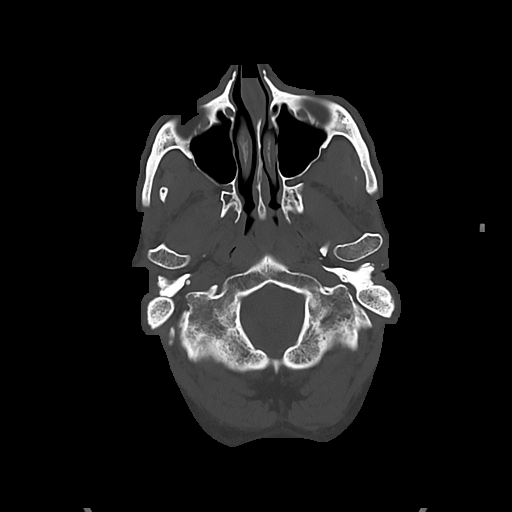
[im 16/78  bone]
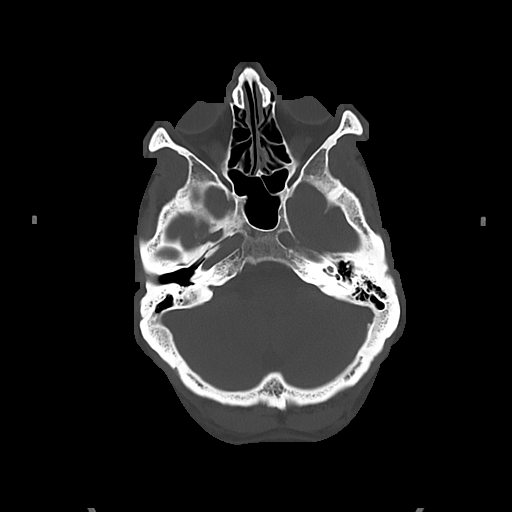
[im 24/78  bone]
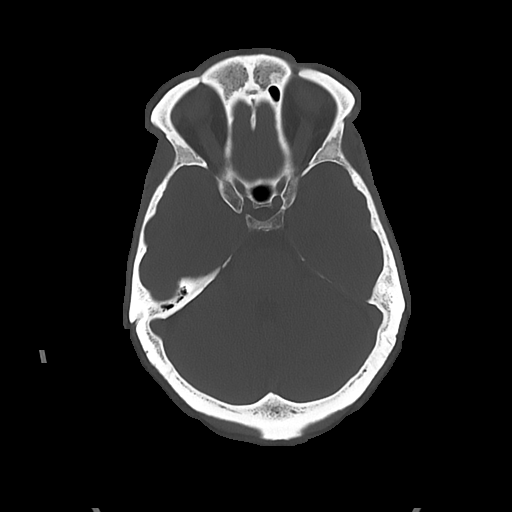
[im 35/78  bone]
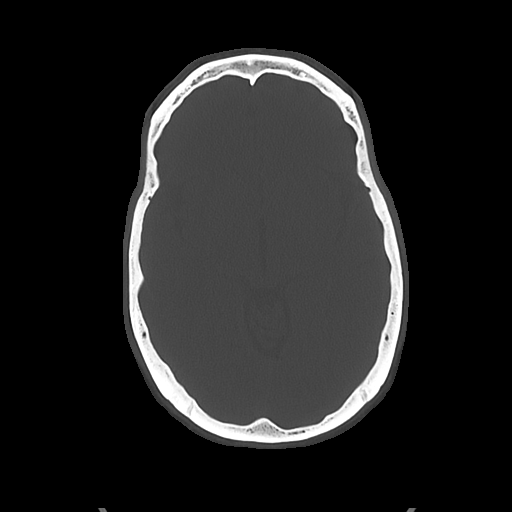

[Series 5: cor soft · coronal · 0.34mm/px · 3 of 77 slices shown]
[im 26/77  brain]
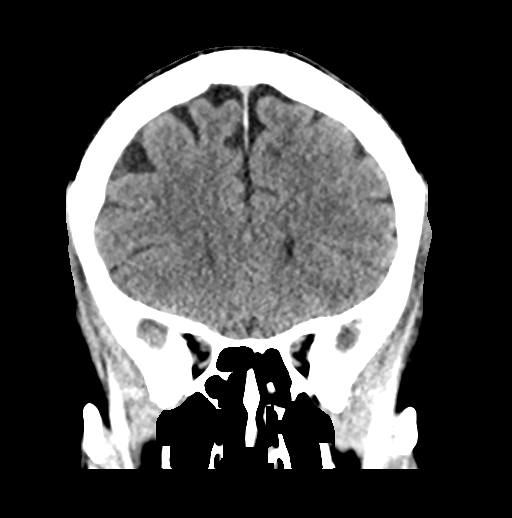
[im 34/77  brain]
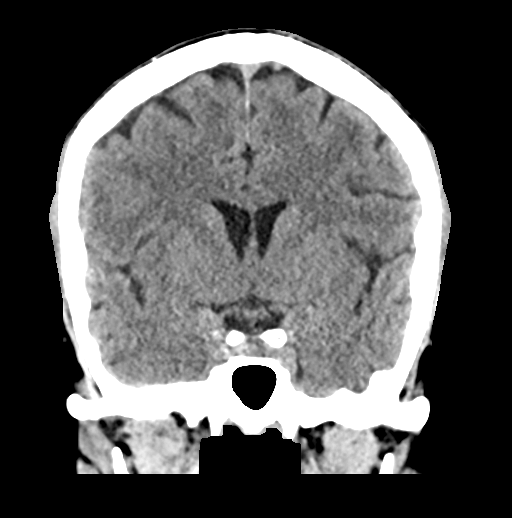
[im 43/77  brain]
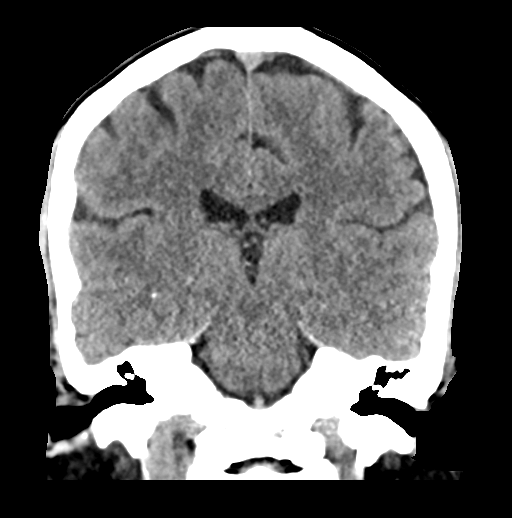

[Series 6: sag soft · sagittal · 0.37mm/px · 3 of 59 slices shown]
[im 20/59  brain]
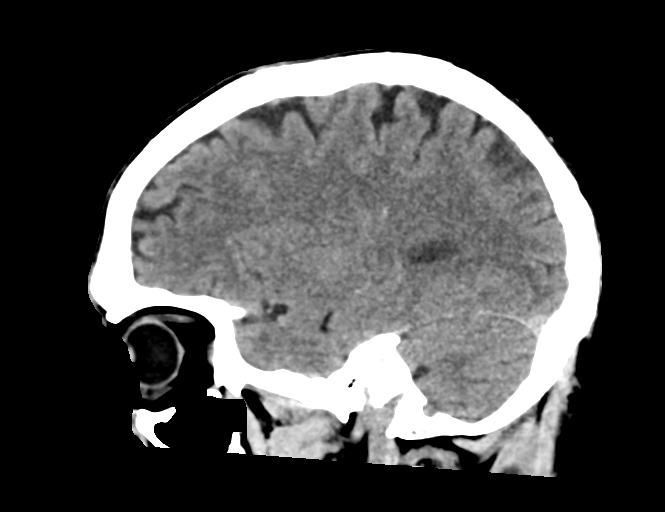
[im 30/59  brain]
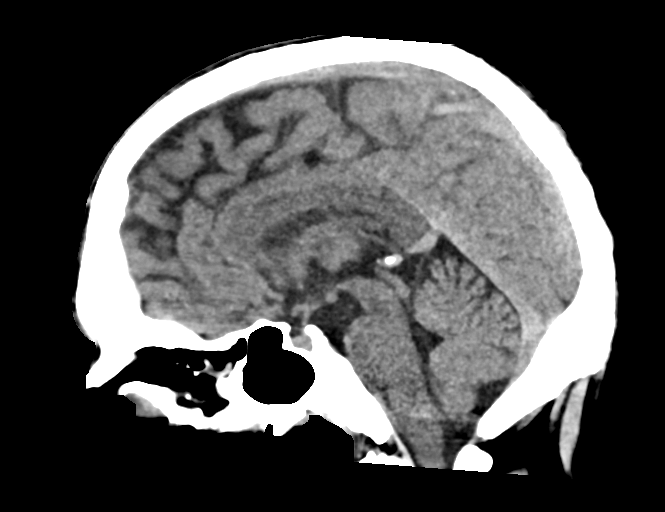
[im 39/59  brain]
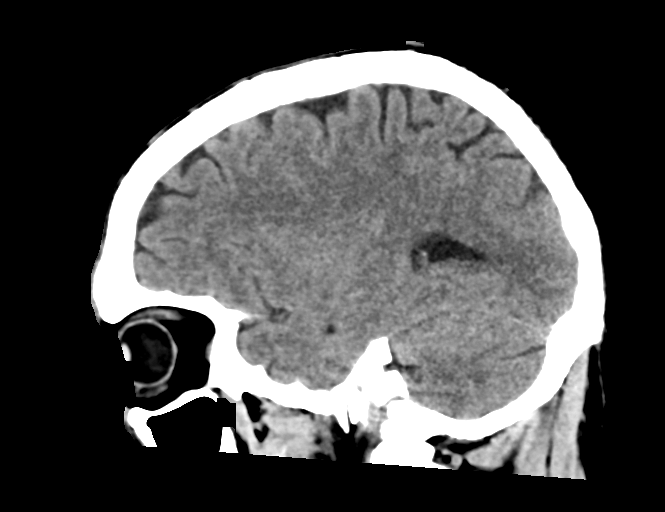

[17 of 47 positions shown; findings below may reference images not displayed]

FINDINGS: Brain: No evidence of acute infarction, hemorrhage, hydrocephalus,
extra-axial collection or mass lesion/mass effect.

Vascular: No hyperdense vessel or unexpected calcification.

Skull: Normal. Negative for fracture or focal lesion.

Sinuses/Orbits: No acute finding.

Other: None.
IMPRESSION: No acute intracranial abnormality noted.

## 2021-09-01 ENCOUNTER — Emergency Department (HOSPITAL_COMMUNITY): Payer: Medicaid Other

## 2021-09-01 ENCOUNTER — Emergency Department (HOSPITAL_COMMUNITY)
Admission: EM | Admit: 2021-09-01 | Discharge: 2021-09-02 | Disposition: A | Discharge status: Home / Self Care | Payer: Medicaid Other | Attending: Emergency Medicine | Admitting: Emergency Medicine

## 2021-09-01 ENCOUNTER — Encounter (HOSPITAL_COMMUNITY): Payer: Self-pay

## 2021-09-01 DIAGNOSIS — Z5321 Procedure and treatment not carried out due to patient leaving prior to being seen by health care provider: Secondary | ICD-10-CM | POA: Insufficient documentation

## 2021-09-01 DIAGNOSIS — R079 Chest pain, unspecified: Secondary | ICD-10-CM | POA: Insufficient documentation

## 2021-09-01 DIAGNOSIS — R5383 Other fatigue: Secondary | ICD-10-CM | POA: Insufficient documentation

## 2021-09-01 DIAGNOSIS — R531 Weakness: Secondary | ICD-10-CM | POA: Insufficient documentation

## 2021-09-01 LAB — BASIC METABOLIC PANEL
Anion gap: 10 (ref 5–15)
BUN: 14 mg/dL (ref 6–20)
CO2: 23 mmol/L (ref 22–32)
Calcium: 8.7 mg/dL — ABNORMAL LOW (ref 8.9–10.3)
Chloride: 103 mmol/L (ref 98–111)
Creatinine, Ser: 1.03 mg/dL (ref 0.61–1.24)
GFR, Estimated: >60 mL/min (ref >60)
Glucose, Bld: 107 mg/dL — ABNORMAL HIGH (ref 70–99)
Potassium: 3.9 mmol/L (ref 3.5–5.1)
Sodium: 136 mmol/L (ref 135–145)

## 2021-09-01 LAB — TROPONIN I (HIGH SENSITIVITY): Troponin I (High Sensitivity): 3 ng/L (ref <18)

## 2021-09-01 LAB — CBC
HCT: 44.4 % (ref 39.0–52.0)
Hemoglobin: 15.1 g/dL (ref 13.0–17.0)
MCH: 30.2 pg (ref 26.0–34.0)
MCHC: 34 g/dL (ref 30.0–36.0)
MCV: 88.8 fL (ref 80.0–100.0)
Platelets: 208 10*3/uL (ref 150–400)
RBC: 5 MIL/uL (ref 4.22–5.81)
RDW: 14 % (ref 11.5–15.5)
WBC: 4.5 10*3/uL (ref 4.0–10.5)
nRBC: 0 % (ref 0.0–0.2)

## 2021-09-01 NOTE — ED Triage Notes (Signed)
Pt states that he had an dental infection about a week ago, was on Augmentin, finished it and was supposed to have tooth removed, missed appt. Now having fatigue, CP that radiates to back, L arm weakness for the past few days

## 2021-09-08 ENCOUNTER — Emergency Department (HOSPITAL_COMMUNITY)
Admission: EM | Admit: 2021-09-08 | Discharge: 2021-09-08 | Disposition: A | Discharge status: Home / Self Care | Payer: Medicaid Other | Attending: Emergency Medicine | Admitting: Emergency Medicine

## 2021-09-08 ENCOUNTER — Encounter (HOSPITAL_COMMUNITY): Payer: Self-pay | Admitting: Pharmacy Technician

## 2021-09-08 ENCOUNTER — Other Ambulatory Visit: Payer: Self-pay

## 2021-09-08 ENCOUNTER — Emergency Department (HOSPITAL_COMMUNITY): Payer: Medicaid Other

## 2021-09-08 DIAGNOSIS — M791 Myalgia, unspecified site: Secondary | ICD-10-CM

## 2021-09-08 DIAGNOSIS — Z7982 Long term (current) use of aspirin: Secondary | ICD-10-CM | POA: Insufficient documentation

## 2021-09-08 LAB — URINALYSIS, ROUTINE W REFLEX MICROSCOPIC
Bilirubin Urine: NEGATIVE
Glucose, UA: NEGATIVE mg/dL
Hgb urine dipstick: NEGATIVE
Ketones, ur: NEGATIVE mg/dL
Leukocytes,Ua: NEGATIVE
Nitrite: NEGATIVE
Protein, ur: NEGATIVE mg/dL
Specific Gravity, Urine: 1.026 (ref 1.005–1.030)
pH: 6 (ref 5.0–8.0)

## 2021-09-08 LAB — BASIC METABOLIC PANEL
Anion gap: 8 (ref 5–15)
BUN: 18 mg/dL (ref 6–20)
CO2: 25 mmol/L (ref 22–32)
Calcium: 8.4 mg/dL — ABNORMAL LOW (ref 8.9–10.3)
Chloride: 101 mmol/L (ref 98–111)
Creatinine, Ser: 0.91 mg/dL (ref 0.61–1.24)
GFR, Estimated: >60 mL/min (ref >60)
Glucose, Bld: 108 mg/dL — ABNORMAL HIGH (ref 70–99)
Potassium: 4.3 mmol/L (ref 3.5–5.1)
Sodium: 134 mmol/L — ABNORMAL LOW (ref 135–145)

## 2021-09-08 LAB — CBC
HCT: 41.1 % (ref 39.0–52.0)
Hemoglobin: 14.2 g/dL (ref 13.0–17.0)
MCH: 29.4 pg (ref 26.0–34.0)
MCHC: 34.5 g/dL (ref 30.0–36.0)
MCV: 85.1 fL (ref 80.0–100.0)
Platelets: UNDETERMINED 10*3/uL (ref 150–400)
RBC: 4.83 MIL/uL (ref 4.22–5.81)
RDW: 13.2 % (ref 11.5–15.5)
WBC: 2.8 10*3/uL — ABNORMAL LOW (ref 4.0–10.5)
nRBC: 0 % (ref 0.0–0.2)

## 2021-09-08 LAB — LACTIC ACID, PLASMA: Lactic Acid, Venous: 0.6 mmol/L (ref 0.5–1.9)

## 2021-09-08 MED ORDER — LACTATED RINGERS IV BOLUS
1000.0000 mL | Freq: Once | INTRAVENOUS | Status: AC
  Administered 2021-09-08: 1000 mL via INTRAVENOUS

## 2021-09-08 MED ORDER — OXYCODONE-ACETAMINOPHEN 5-325 MG PO TABS
2.0000 | ORAL_TABLET | Freq: Once | ORAL | Status: AC
  Administered 2021-09-08: 2 via ORAL
  Filled 2021-09-08: qty 2

## 2022-03-03 DIAGNOSIS — G8929 Other chronic pain: Secondary | ICD-10-CM | POA: Diagnosis not present

## 2022-03-03 DIAGNOSIS — I509 Heart failure, unspecified: Secondary | ICD-10-CM | POA: Diagnosis not present

## 2022-03-03 DIAGNOSIS — R7309 Other abnormal glucose: Secondary | ICD-10-CM | POA: Diagnosis not present

## 2022-03-03 DIAGNOSIS — F319 Bipolar disorder, unspecified: Secondary | ICD-10-CM | POA: Diagnosis not present

## 2022-03-03 DIAGNOSIS — Z79899 Other long term (current) drug therapy: Secondary | ICD-10-CM | POA: Diagnosis not present

## 2022-03-03 DIAGNOSIS — Z6826 Body mass index (BMI) 26.0-26.9, adult: Secondary | ICD-10-CM | POA: Diagnosis not present

## 2022-03-03 DIAGNOSIS — J439 Emphysema, unspecified: Secondary | ICD-10-CM | POA: Diagnosis not present

## 2022-03-03 DIAGNOSIS — Z1211 Encounter for screening for malignant neoplasm of colon: Secondary | ICD-10-CM | POA: Diagnosis not present

## 2022-03-03 DIAGNOSIS — F411 Generalized anxiety disorder: Secondary | ICD-10-CM | POA: Diagnosis not present

## 2022-03-03 DIAGNOSIS — M545 Low back pain, unspecified: Secondary | ICD-10-CM | POA: Diagnosis not present

## 2022-03-04 ENCOUNTER — Ambulatory Visit: Payer: Medicaid Other | Admitting: Family Medicine

## 2022-03-04 DIAGNOSIS — J439 Emphysema, unspecified: Secondary | ICD-10-CM | POA: Diagnosis not present

## 2022-03-04 DIAGNOSIS — R0602 Shortness of breath: Secondary | ICD-10-CM | POA: Diagnosis not present

## 2022-03-04 DIAGNOSIS — F17210 Nicotine dependence, cigarettes, uncomplicated: Secondary | ICD-10-CM | POA: Diagnosis not present

## 2022-03-04 DIAGNOSIS — Z87891 Personal history of nicotine dependence: Secondary | ICD-10-CM | POA: Diagnosis not present

## 2022-03-04 DIAGNOSIS — F411 Generalized anxiety disorder: Secondary | ICD-10-CM | POA: Diagnosis not present

## 2022-03-05 DIAGNOSIS — Z79899 Other long term (current) drug therapy: Secondary | ICD-10-CM | POA: Diagnosis not present

## 2022-03-11 ENCOUNTER — Ambulatory Visit: Payer: Medicaid Other | Admitting: Family Medicine

## 2022-03-24 DIAGNOSIS — F410 Panic disorder [episodic paroxysmal anxiety] without agoraphobia: Secondary | ICD-10-CM | POA: Diagnosis not present

## 2022-03-24 DIAGNOSIS — Z79899 Other long term (current) drug therapy: Secondary | ICD-10-CM | POA: Diagnosis not present

## 2022-04-03 DIAGNOSIS — M79605 Pain in left leg: Secondary | ICD-10-CM | POA: Diagnosis not present

## 2022-04-03 DIAGNOSIS — M79604 Pain in right leg: Secondary | ICD-10-CM | POA: Diagnosis not present

## 2022-04-03 DIAGNOSIS — Z79899 Other long term (current) drug therapy: Secondary | ICD-10-CM | POA: Diagnosis not present

## 2022-04-03 DIAGNOSIS — M545 Low back pain, unspecified: Secondary | ICD-10-CM | POA: Diagnosis not present

## 2022-04-03 DIAGNOSIS — M25511 Pain in right shoulder: Secondary | ICD-10-CM | POA: Diagnosis not present

## 2022-04-07 DIAGNOSIS — Z79899 Other long term (current) drug therapy: Secondary | ICD-10-CM | POA: Diagnosis not present

## 2022-04-26 DIAGNOSIS — J329 Chronic sinusitis, unspecified: Secondary | ICD-10-CM | POA: Diagnosis not present

## 2022-04-26 DIAGNOSIS — R059 Cough, unspecified: Secondary | ICD-10-CM | POA: Diagnosis not present

## 2022-04-29 DIAGNOSIS — R0600 Dyspnea, unspecified: Secondary | ICD-10-CM | POA: Diagnosis not present

## 2022-04-29 DIAGNOSIS — Z20822 Contact with and (suspected) exposure to covid-19: Secondary | ICD-10-CM | POA: Diagnosis not present

## 2022-04-29 DIAGNOSIS — F319 Bipolar disorder, unspecified: Secondary | ICD-10-CM | POA: Diagnosis not present

## 2022-04-29 DIAGNOSIS — R0602 Shortness of breath: Secondary | ICD-10-CM | POA: Diagnosis not present

## 2022-04-30 DIAGNOSIS — J0140 Acute pansinusitis, unspecified: Secondary | ICD-10-CM | POA: Diagnosis not present

## 2022-04-30 DIAGNOSIS — H66003 Acute suppurative otitis media without spontaneous rupture of ear drum, bilateral: Secondary | ICD-10-CM | POA: Diagnosis not present

## 2022-04-30 DIAGNOSIS — H9203 Otalgia, bilateral: Secondary | ICD-10-CM | POA: Diagnosis not present

## 2022-04-30 DIAGNOSIS — R0602 Shortness of breath: Secondary | ICD-10-CM | POA: Diagnosis not present

## 2022-05-02 DIAGNOSIS — E559 Vitamin D deficiency, unspecified: Secondary | ICD-10-CM | POA: Diagnosis not present

## 2022-05-02 DIAGNOSIS — R5383 Other fatigue: Secondary | ICD-10-CM | POA: Diagnosis not present

## 2022-05-02 DIAGNOSIS — M545 Low back pain, unspecified: Secondary | ICD-10-CM | POA: Diagnosis not present

## 2022-05-02 DIAGNOSIS — Z79899 Other long term (current) drug therapy: Secondary | ICD-10-CM | POA: Diagnosis not present

## 2022-05-02 DIAGNOSIS — M129 Arthropathy, unspecified: Secondary | ICD-10-CM | POA: Diagnosis not present

## 2022-05-02 DIAGNOSIS — Z125 Encounter for screening for malignant neoplasm of prostate: Secondary | ICD-10-CM | POA: Diagnosis not present

## 2022-05-02 DIAGNOSIS — Z0000 Encounter for general adult medical examination without abnormal findings: Secondary | ICD-10-CM | POA: Diagnosis not present

## 2022-05-05 DIAGNOSIS — Z79899 Other long term (current) drug therapy: Secondary | ICD-10-CM | POA: Diagnosis not present

## 2022-05-07 DIAGNOSIS — F411 Generalized anxiety disorder: Secondary | ICD-10-CM | POA: Diagnosis not present

## 2022-05-07 DIAGNOSIS — F319 Bipolar disorder, unspecified: Secondary | ICD-10-CM | POA: Diagnosis not present

## 2022-05-07 DIAGNOSIS — F909 Attention-deficit hyperactivity disorder, unspecified type: Secondary | ICD-10-CM | POA: Diagnosis not present

## 2022-05-07 DIAGNOSIS — Z79899 Other long term (current) drug therapy: Secondary | ICD-10-CM | POA: Diagnosis not present

## 2022-05-08 DIAGNOSIS — Z21 Asymptomatic human immunodeficiency virus [HIV] infection status: Secondary | ICD-10-CM | POA: Diagnosis not present

## 2022-05-08 DIAGNOSIS — Z6826 Body mass index (BMI) 26.0-26.9, adult: Secondary | ICD-10-CM | POA: Diagnosis not present

## 2022-05-12 DIAGNOSIS — M79604 Pain in right leg: Secondary | ICD-10-CM | POA: Diagnosis not present

## 2022-05-12 DIAGNOSIS — Z87891 Personal history of nicotine dependence: Secondary | ICD-10-CM | POA: Diagnosis not present

## 2022-05-12 DIAGNOSIS — R0602 Shortness of breath: Secondary | ICD-10-CM | POA: Diagnosis not present

## 2022-05-12 DIAGNOSIS — M79605 Pain in left leg: Secondary | ICD-10-CM | POA: Diagnosis not present

## 2022-05-12 DIAGNOSIS — R42 Dizziness and giddiness: Secondary | ICD-10-CM | POA: Diagnosis not present

## 2022-05-12 DIAGNOSIS — M545 Low back pain, unspecified: Secondary | ICD-10-CM | POA: Diagnosis not present

## 2022-05-21 ENCOUNTER — Telehealth: Payer: Self-pay

## 2022-05-21 NOTE — Telephone Encounter (Signed)
Mychart msg sent

## 2022-06-05 ENCOUNTER — Other Ambulatory Visit: Payer: Self-pay

## 2022-06-05 ENCOUNTER — Encounter: Payer: Self-pay | Admitting: Family

## 2022-06-05 ENCOUNTER — Ambulatory Visit (INDEPENDENT_AMBULATORY_CARE_PROVIDER_SITE_OTHER): Payer: 59 | Admitting: Family

## 2022-06-05 VITALS — BP 113/76 | HR 93 | Temp 98.1°F | Ht 71.0 in | Wt 190.0 lb

## 2022-06-05 DIAGNOSIS — Z113 Encounter for screening for infections with a predominantly sexual mode of transmission: Secondary | ICD-10-CM | POA: Diagnosis not present

## 2022-06-05 NOTE — Patient Instructions (Addendum)
Nice to see you.  We will check your lab work today.  Follow up pending lab work results as needed.   Have a great day and stay safe!   HIV Infection and AIDS HIV (human immunodeficiency virus) infection is a permanent (chronic) viral infection. HIV kills white blood cells that are called CD4 cells. These cells help to control the body's defense system (immune system) and fight infection. If a person does not have enough CD4 cells, he or she can develop infections, cancers, and other health problems. What are the causes? This virus is passed from person to person: Through sex. Through contact with infected blood or other bodily fluids. An infected mother can also pass the virus to her baby during pregnancy, childbirth, or breastfeeding. What increases the risk? This condition is more likely to develop in people who: Have unprotected sex. Share needles or other drug equipment. What are the signs or symptoms? Symptoms of this condition usually develop in phases: Asymptomatic phase You may not feel sick, or you may only feel sick some of the time. Symptoms may include: Low-grade fever. Headaches. Sore throat. Rash. Fatigue. Nausea, vomiting, or diarrhea. Night sweats. Early symptomatic phase You may notice: Your early symptoms getting worse or happening more often. Oral, vaginal, or rectal sores that are caused by infections. Problems that are related to inflammation, such as joint pain. Symptomatic phase: AIDS, or acquired immunodeficiency syndrome Your immune system no longer protects you from infections and other health problems. You may get infections you would not normally get if your immune system was healthy and working properly (opportunistic diseases). Problems that are caused by opportunistic diseases include: Coughing. Trouble breathing. Diarrhea. Skin sores. Trouble swallowing. High fevers. Blurred vision. Stiff neck. Mental confusion. You may also begin to  notice: Weight loss. Tingling or pain in your hands and feet. Mouth sores or tooth pain. Severe fatigue. How is this diagnosed? This condition is diagnosed with: A screening test to check blood for a protein (antibody) that is produced only when the body is fighting HIV. A blood test to confirm the presence of HIV. How is this treated? There is no cure for this condition, but treatment can help to keep HIV from getting worse. You will be given medicines that may slow down the rate at which HIV multiplies in your body (antiretroviral therapy, or ART). ART may: Keep your immune system as healthy as possible and help it work better. Decrease the amount of HIV in your body. Reduce the risk of problems caused by HIV. Prolong your life. Improve the quality of your life. Help prevent passing HIV to someone. You will need to have routine lab tests performed to monitor your treatment and immune system. Follow these instructions at home: Medicines Take over-the-counter and prescription medicines only as told by your health care provider. If you were prescribed an antibiotic medicine, take it as told by your health care provider. Do not stop taking the antibiotic even if you start to feel better. Lifestyle  Do not use any products that contain alcohol, nicotine, tobacco, or recreational drugs. These can cause further damage to your immune system. They can also cause problems with your liver, lungs, and heart. If you need help quitting, ask your health care provider. Do not share needles or other equipment used for injecting, smoking, or snorting drugs. Protect yourself from other STIs (sexually transmitted infections) by using condoms when you have sex. This includes vaginal, oral, and anal sex. Eat in a healthy way,  get enough sleep, and exercise. General instructions  Tell your sexual partners that you have HIV. Encourage them to get tested. Keep your vaccinations up to date. Make sure that  you get all recommended vaccines, including vaccines for hepatitis A, hepatitis B, measles, and influenza. See your dentist regularly. Brush and floss your teeth every day. See a counselor or a Education officer, museum to help you solve problems and find any services that you need. Get support from your family and friends. Keep all follow-up visits. This is important. You will need to have routine blood tests every 3-6 months to monitor your health. How is this prevented? To prevent the spread of HIV: Talk with your health care provider about protecting your sexual partners from HIV. Your health care provider may encourage your partner to take medicines to decrease the risk of getting HIV (pre-exposure prophylaxis, or PrEP). Use a condom every time you have sex. This includes vaginal, oral, and anal sex. The condom should be in place from the beginning of the sexual activity to the end. Use only latex or polyurethane condoms and water-based lubricants. Wearing a condom reduces your risk of spreading HIV. Avoid alcohol and recreational drugs that affect your judgment. They may make you forget to use a condom or may increase your chances of participating in high-risk sex. Do not share equipment that is used to take drugs, such as needles, syringes, cookers, tourniquets, pipes, or straws. If you share equipment, clean it before and after you use it. Contact a health care provider if you have: Lost a lot of weight. Extreme fatigue. Trouble swallowing. Vomiting or diarrhea that does not get better. Muscle pain or joint pain. Any problems related to your medicines. Get help right away if you have: A rash that causes your skin to peel. Blisters inside your mouth. Pain in your abdomen. Eye redness or swelling around your eyes. A high fever and chills. Shortness of breath. A cough that is dry (nonproductive) or wet (productive). Vision problems, such as blind spots, flashing lights, or decreased or blurred  vision. A persistent headache, confusion, or changes in the way that you think, feel, or behave (altered mental status). Summary HIV kills white blood cells called CD4 cells that help to control the body's defense system (immune system) and fight infection. Symptoms of this condition usually develop in phases. In the asymptomatic phase, you may not feel sick, or you may only feel sick some of the time. Treatment will include medicines to slow down the rate at which HIV multiplies in your body (antiretroviral therapy, or ART). This information is not intended to replace advice given to you by your health care provider. Make sure you discuss any questions you have with your health care provider. Document Revised: 09/07/2019 Document Reviewed: 09/07/2019 Elsevier Patient Education  [place].

## 2022-06-05 NOTE — Progress Notes (Signed)
Subjective:    Patient ID: BLY BAE, male    DOB: 1965/05/01, 57 y.o.   MRN: [id number]  Chief Complaint  Patient presents with   New Patient (Initial Visit)    HPI:  [name] [name] is a 57 y.o. male with previous medical history of polysubstance use, chronic joint pain, bipolar depression and ADHD presenting today with concern for HIV disease.   [name] [name] follows at [facility] who referred him with concern for potential HIV disease. Lab work completed on separate occasions was sent to [facility] however for unclear reason was not able to be completed. Last negative HIV test was in 2022 and he has only male partners with his most recent partner testing negative. Has experienced weight loss with no other symptoms and denies fevers, chills, night sweats, headaches, changes in vision, neck pain/stiffness, nausea, diarrhea, vomiting, lesions or rashes.  Allergies  Allergen Reactions   Vicodin [Hydrocodone-Acetaminophen] Nausea And Vomiting      Outpatient Medications Prior to Visit  Medication Sig Dispense Refill   cariprazine (VRAYLAR) 3 MG capsule Take 3 mg by mouth daily.     clonazePAM (KLONOPIN) 0.5 MG tablet Take 0.5 mg by mouth daily.     Oxycodone HCl 10 MG TABS Take 10 mg by mouth daily as needed (pain).     aspirin 81 MG EC tablet Take 81 mg by mouth daily. (Patient not taking: Reported on 06/05/2022)     carvedilol (COREG) 3.125 MG tablet Take 3.125 mg by mouth once. (Patient not taking: Reported on 06/05/2022)     clonazePAM (KLONOPIN) 1 MG tablet Take 1 mg by mouth 4 (four) times daily. (Patient not taking: Reported on 06/05/2022)     ibuprofen (ADVIL) 600 MG tablet Take by mouth. (Patient not taking: Reported on 06/05/2022)     No facility-administered medications prior to visit.     Past Medical History:  Diagnosis Date   Arthritis    Back pain    Bipolar disorder ([place])    MI (myocardial infarction) ([place])    at age 24   Polysubstance abuse ([place])    TIA  (transient ischemic attack)    Tobacco abuse      Past Surgical History:  Procedure Laterality Date   FOOT SURGERY     IR RADIOLOGIST EVAL & MGMT  07/19/2020   PACEMAKER PLACEMENT         Review of Systems  Constitutional:  Positive for unexpected weight change. Negative for appetite change, chills, fatigue and fever.  Eyes:  Negative for visual disturbance.  Respiratory:  Negative for cough, chest tightness, shortness of breath and wheezing.   Cardiovascular:  Negative for chest pain and leg swelling.  Gastrointestinal:  Negative for abdominal pain, constipation, diarrhea, nausea and vomiting.  Genitourinary:  Negative for dysuria, flank pain, frequency, genital sores, hematuria and urgency.  Musculoskeletal:  Positive for back pain.  Skin:  Negative for rash.  Allergic/Immunologic: Negative for immunocompromised state.  Neurological:  Negative for dizziness and headaches.      Objective:    BP 113/76   Pulse 93   Temp 98.1 F (36.7 C) (Oral)   Ht 5\' 11"  (1.803 m)   Wt 190 lb (86.2 kg)   SpO2 94%   BMI 26.50 kg/m  Nursing note and vital signs reviewed.  Physical Exam Constitutional:      General: He is not in acute distress.    Appearance: He is well-developed.  Eyes:     Conjunctiva/sclera: Conjunctivae  normal.  Cardiovascular:     Rate and Rhythm: Normal rate and regular rhythm.     Heart sounds: Normal heart sounds. No murmur heard.    No friction rub. No gallop.  Pulmonary:     Effort: Pulmonary effort is normal. No respiratory distress.     Breath sounds: Normal breath sounds. No wheezing or rales.  Chest:     Chest wall: No tenderness.  Abdominal:     General: Bowel sounds are normal.     Palpations: Abdomen is soft.     Tenderness: There is no abdominal tenderness.  Musculoskeletal:     Cervical back: Neck supple.  Lymphadenopathy:     Cervical: No cervical adenopathy.  Skin:    General: Skin is warm and dry.     Findings: No rash.   Neurological:     Mental Status: He is alert and oriented to person, place, and time.  Psychiatric:        Behavior: Behavior normal.        Thought Content: Thought content normal.        Judgment: Judgment normal.         06/05/2022   11:13 AM 06/26/2014    9:51 AM  Depression screen PHQ 2/9  Decreased Interest 3 0  Down, Depressed, Hopeless 3 0  PHQ - 2 Score 6 0  Altered sleeping 3   Tired, decreased energy 3   Change in appetite 3   Feeling bad or failure about yourself  3   Trouble concentrating 3   Moving slowly or fidgety/restless 3   Suicidal thoughts 0   PHQ-9 Score 24   Difficult doing work/chores Extremely dIfficult        Assessment & Plan:    Patient Active Problem List   Diagnosis Date Noted   Screening examination for venereal disease 06/06/2022   Cigarette smoker 08/07/2020   Blurry vision, left eye 07/01/2020   Precordial pain 05/10/2020   DOE (dyspnea on exertion)    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[place]) 08/10/2014   Amphetamine use disorder, 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 Risk factors include drug use and heterosexual contact. Previous testing was unable to be completed. Discussed the basics of HIV including transmission, risks if left untreated, treatment options, lab work, financial assistance, and plan of care. Will check HIV status today to determine if infection is indeed present.  Additional follow up and treatment pending lab work results.       Relevant Orders   HIV Antibody (routine testing w rflx)   HIV-1 RNA quant-no reflex-bld     I am having [name] [name]. [name] [name] maintain his clonazePAM, aspirin EC, cariprazine, Oxycodone HCl, carvedilol, ibuprofen, and clonazePAM.   Follow-up: Pending lab work results as needed.    Terri Piedra, MSN, FNP-C Nurse Practitioner [facility] [place] number: [phone]

## 2022-06-06 ENCOUNTER — Other Ambulatory Visit: Payer: 59

## 2022-06-06 ENCOUNTER — Encounter: Payer: Self-pay | Admitting: Family

## 2022-06-06 DIAGNOSIS — Z113 Encounter for screening for infections with a predominantly sexual mode of transmission: Secondary | ICD-10-CM | POA: Insufficient documentation

## 2022-06-06 NOTE — Assessment & Plan Note (Signed)
[name] [name] is a 57 y/o caucasian male presenting with concern for possible HIV disease. Risk factors include drug use and heterosexual contact. Previous testing was unable to be completed. Discussed the basics of HIV including transmission, risks if left untreated, treatment options, lab work, financial assistance, and plan of care. Will check HIV status today to determine if infection is indeed present. Additional follow up and treatment pending lab work results.

## 2022-06-11 ENCOUNTER — Encounter (HOSPITAL_COMMUNITY): Payer: Self-pay

## 2022-06-11 ENCOUNTER — Other Ambulatory Visit: Payer: Self-pay

## 2022-06-11 ENCOUNTER — Emergency Department (HOSPITAL_COMMUNITY)
Admission: EM | Admit: 2022-06-11 | Discharge: 2022-06-11 | Discharge status: Against Medical Advice | Payer: 59 | Attending: Emergency Medicine | Admitting: Emergency Medicine

## 2022-06-11 ENCOUNTER — Emergency Department (HOSPITAL_COMMUNITY): Payer: 59

## 2022-06-11 DIAGNOSIS — Z21 Asymptomatic human immunodeficiency virus [HIV] infection status: Secondary | ICD-10-CM | POA: Diagnosis not present

## 2022-06-11 DIAGNOSIS — R0602 Shortness of breath: Secondary | ICD-10-CM | POA: Diagnosis present

## 2022-06-11 DIAGNOSIS — Z20822 Contact with and (suspected) exposure to covid-19: Secondary | ICD-10-CM | POA: Diagnosis not present

## 2022-06-11 DIAGNOSIS — R079 Chest pain, unspecified: Secondary | ICD-10-CM | POA: Insufficient documentation

## 2022-06-11 DIAGNOSIS — F1210 Cannabis abuse, uncomplicated: Secondary | ICD-10-CM | POA: Insufficient documentation

## 2022-06-11 DIAGNOSIS — Z5321 Procedure and treatment not carried out due to patient leaving prior to being seen by health care provider: Secondary | ICD-10-CM | POA: Insufficient documentation

## 2022-06-11 DIAGNOSIS — R42 Dizziness and giddiness: Secondary | ICD-10-CM | POA: Insufficient documentation

## 2022-06-11 DIAGNOSIS — B20 Human immunodeficiency virus [HIV] disease: Secondary | ICD-10-CM

## 2022-06-11 DIAGNOSIS — R059 Cough, unspecified: Secondary | ICD-10-CM | POA: Diagnosis not present

## 2022-06-11 DIAGNOSIS — J449 Chronic obstructive pulmonary disease, unspecified: Secondary | ICD-10-CM | POA: Insufficient documentation

## 2022-06-11 DIAGNOSIS — F17210 Nicotine dependence, cigarettes, uncomplicated: Secondary | ICD-10-CM | POA: Diagnosis not present

## 2022-06-11 LAB — CBC WITH DIFFERENTIAL/PLATELET
Abs Immature Granulocytes: 0.04 10*3/uL (ref 0.00–0.07)
Basophils Absolute: 0 10*3/uL (ref 0.0–0.1)
Basophils Relative: 0 %
Eosinophils Absolute: 0 10*3/uL (ref 0.0–0.5)
Eosinophils Relative: 1 %
HCT: 51.8 % (ref 39.0–52.0)
Hemoglobin: 17.5 g/dL — ABNORMAL HIGH (ref 13.0–17.0)
Immature Granulocytes: 1 %
Lymphocytes Relative: 33 %
Lymphs Abs: 2.2 10*3/uL (ref 0.7–4.0)
MCH: 28.5 pg (ref 26.0–34.0)
MCHC: 33.8 g/dL (ref 30.0–36.0)
MCV: 84.2 fL (ref 80.0–100.0)
Monocytes Absolute: 0.9 10*3/uL (ref 0.1–1.0)
Monocytes Relative: 14 %
Neutro Abs: 3.4 10*3/uL (ref 1.7–7.7)
Neutrophils Relative %: 51 %
Platelets: 346 10*3/uL (ref 150–400)
RBC: 6.15 MIL/uL — ABNORMAL HIGH (ref 4.22–5.81)
RDW: 13.2 % (ref 11.5–15.5)
WBC: 6.7 10*3/uL (ref 4.0–10.5)
nRBC: 0 % (ref 0.0–0.2)

## 2022-06-11 LAB — RAPID URINE DRUG SCREEN, HOSP PERFORMED
Amphetamines: NOT DETECTED
Barbiturates: NOT DETECTED
Benzodiazepines: NOT DETECTED
Cocaine: NOT DETECTED
Opiates: NOT DETECTED
Tetrahydrocannabinol: POSITIVE — AB

## 2022-06-11 LAB — RAPID HIV SCREEN (HIV 1/2 AB+AG)
HIV 1/2 Antibodies: REACTIVE — AB
HIV-1 P24 Antigen - HIV24: NONREACTIVE

## 2022-06-11 LAB — BASIC METABOLIC PANEL
Anion gap: 11 (ref 5–15)
BUN: 7 mg/dL (ref 6–20)
CO2: 24 mmol/L (ref 22–32)
Calcium: 9.9 mg/dL (ref 8.9–10.3)
Chloride: 103 mmol/L (ref 98–111)
Creatinine, Ser: 0.92 mg/dL (ref 0.61–1.24)
GFR, Estimated: >60 mL/min (ref >60)
Glucose, Bld: 111 mg/dL — ABNORMAL HIGH (ref 70–99)
Potassium: 3.6 mmol/L (ref 3.5–5.1)
Sodium: 138 mmol/L (ref 135–145)

## 2022-06-11 LAB — RESP PANEL BY RT-PCR (RSV, FLU A&B, COVID)  RVPGX2
Influenza A by PCR: NEGATIVE
Influenza B by PCR: NEGATIVE
Resp Syncytial Virus by PCR: NEGATIVE
SARS Coronavirus 2 by RT PCR: NEGATIVE

## 2022-06-11 LAB — BRAIN NATRIURETIC PEPTIDE: B Natriuretic Peptide: 5.1 pg/mL (ref 0.0–100.0)

## 2022-06-11 LAB — TROPONIN I (HIGH SENSITIVITY): Troponin I (High Sensitivity): 3 ng/L (ref <18)

## 2022-06-11 MED ORDER — KETOROLAC TROMETHAMINE 15 MG/ML IJ SOLN
15.0000 mg | Freq: Once | INTRAMUSCULAR | Status: AC
  Administered 2022-06-11: 15 mg via INTRAVENOUS
  Filled 2022-06-11: qty 1

## 2022-06-11 MED ORDER — ONDANSETRON HCL 4 MG/2ML IJ SOLN
4.0000 mg | Freq: Once | INTRAMUSCULAR | Status: AC
  Administered 2022-06-11: 4 mg via INTRAVENOUS
  Filled 2022-06-11: qty 2

## 2022-06-11 MED ORDER — METOCLOPRAMIDE HCL 5 MG/ML IJ SOLN
10.0000 mg | Freq: Once | INTRAMUSCULAR | Status: AC
  Administered 2022-06-11: 10 mg via INTRAVENOUS
  Filled 2022-06-11: qty 2

## 2022-06-11 MED ORDER — HYDROXYZINE HCL 25 MG PO TABS
25.0000 mg | ORAL_TABLET | Freq: Once | ORAL | Status: AC
  Administered 2022-06-11: 25 mg via ORAL
  Filled 2022-06-11: qty 1

## 2022-06-11 MED ORDER — LACTATED RINGERS IV BOLUS
1000.0000 mL | Freq: Once | INTRAVENOUS | Status: AC
  Administered 2022-06-11: 1000 mL via INTRAVENOUS

## 2022-06-11 MED ORDER — DIAZEPAM 5 MG/ML IJ SOLN
2.5000 mg | Freq: Once | INTRAMUSCULAR | Status: AC
  Administered 2022-06-11: 2.5 mg via INTRAVENOUS
  Filled 2022-06-11: qty 2

## 2022-06-11 MED ORDER — ASPIRIN 325 MG PO TABS
325.0000 mg | ORAL_TABLET | Freq: Every day | ORAL | Status: DC
  Administered 2022-06-11: 325 mg via ORAL
  Filled 2022-06-11: qty 1

## 2022-06-11 MED ORDER — ACETAMINOPHEN 500 MG PO TABS
1000.0000 mg | ORAL_TABLET | Freq: Once | ORAL | Status: AC
  Administered 2022-06-11: 1000 mg via ORAL
  Filled 2022-06-11: qty 2

## 2022-06-11 NOTE — ED Notes (Signed)
Pt requesting to sign himself out; reports feeling frustrated and hungry. AMA signed in [place]. Ambulatory with steady gait with significant other

## 2022-06-11 NOTE — ED Provider Notes (Signed)
[place] EMERGENCY DEPARTMENT AT [facility] Provider Note   CSN: [id number] Arrival date & time: 06/11/22  1637     History  Chief Complaint  Patient presents with   Chest Pain    [name] [name] is a 57 y.o. male with recent diagnosis of HIV, polysubstance use, antisocial personality disorder, anxiety, COPD, ADD, who presents with chest pain.   Pt complains of shortness of breath and intermittent chest pain, "tightness" on the left side of the chest for the last 3 days. Having a productive cough, white phlegm. Also with nausea/vomiting and anorexia, decreased PO intake. Smokes cigarettes daily, trying to quit. History of COPD does not take medicine for it.  Recently diagnosed with HIV on 06/07/22, has not followed up with that doctor because he felt so poorly. Was told he needed confirmatory test and to f/u with infectious disease.  Denies any fevers but endorses chills. Denies abdominal pain, urinary symptoms, melena/hematochezia, sick contacts, drug/alcohol use, leg swelling, recent travel, recent hospitalizations/surgeries. No history of similar.    Chest Pain      Home Medications Prior to Admission medications   Medication Sig Start Date End Date Taking? Authorizing Provider  aspirin 81 MG EC tablet Take 81 mg by mouth daily. Patient not taking: Reported on 06/05/2022    [provider]  cariprazine (VRAYLAR) 3 MG capsule Take 3 mg by mouth daily.    [provider]  carvedilol (COREG) 3.125 MG tablet Take 3.125 mg by mouth once. Patient not taking: Reported on 06/05/2022    [provider]  clonazePAM (KLONOPIN) 0.5 MG tablet Take 0.5 mg by mouth daily.    [provider]  clonazePAM (KLONOPIN) 1 MG tablet Take 1 mg by mouth 4 (four) times daily. Patient not taking: Reported on 06/05/2022    [provider]  ibuprofen (ADVIL) 600 MG tablet Take by mouth. Patient not taking: Reported on 06/05/2022 09/22/16   [provider]  Oxycodone HCl 10 MG TABS Take 10 mg by mouth daily as needed (pain).    [provider]      Allergies    Vicodin [hydrocodone-acetaminophen]    Review of Systems   Review of Systems  Cardiovascular:  Positive for chest pain.   Review of systems Negative for f/c.  A 10 point review of systems was performed and is negative unless otherwise reported in HPI.  Physical Exam Updated Vital Signs BP 102/76   Pulse 94   Temp (!) 97.4 F (36.3 C) (Oral)   Resp (!) 22   Ht 5\' 11"  (1.803 m)   Wt 89.8 kg   SpO2 100%   BMI 27.62 kg/m  Physical Exam General: Normal appearing male, lying in bed.  HEENT: PERRLA, EOMI, Sclera anicteric, MMM, trachea midline.  Cardiology: RRR, no murmurs/rubs/gallops. BL radial and DP pulses equal bilaterally.  Resp: Normal respiratory rate and effort. CTAB, no wheezes, rhonchi, crackles.  Abd: Soft, non-tender, non-distended. No rebound tenderness or guarding.  GU: Deferred. MSK: No peripheral edema or signs of trauma. Extremities without deformity or TTP. No cyanosis or clubbing. Skin: warm, dry. No rashes or lesions. Neuro: A&Ox4, CNs II-XII grossly intact. MAEs. Sensation grossly intact.  Psych: Anxious mood and affect, bouncing leg.   ED Results / Procedures / Treatments   Labs (all labs ordered are listed, but only abnormal results are displayed) Labs Reviewed  BASIC METABOLIC PANEL - Abnormal; Notable for the following components:      Result Value  Glucose, Bld 111 (*)    All other components within normal limits  CBC WITH DIFFERENTIAL/PLATELET - Abnormal; Notable for the following components:   RBC 6.15 (*)    Hemoglobin 17.5 (*)    All other components within normal limits  RAPID HIV SCREEN (HIV 1/2 AB+AG) - Abnormal; Notable for the following components:   HIV 1/2 Antibodies Reactive (*)    All other components within normal limits  RAPID URINE DRUG SCREEN, HOSP PERFORMED - Abnormal; Notable for the following  components:   Tetrahydrocannabinol POSITIVE (*)    All other components within normal limits  RESP PANEL BY RT-PCR (RSV, FLU A&B, COVID)  RVPGX2  BRAIN NATRIURETIC PEPTIDE  T-HELPER CELLS (CD4) COUNT (NOT AT [facility])  HIV-1/2 AB - DIFFERENTIATION  TROPONIN I (HIGH SENSITIVITY)  TROPONIN I (HIGH SENSITIVITY)    EKG EKG Interpretation  Date/Time:  Wednesday June 11 2022 16:30:32 EDT Ventricular Rate:  109 PR Interval:  140 QRS Duration: 74 QT Interval:  304 QTC Calculation: 409 R Axis:   89 Text Interpretation: Sinus tachycardia Otherwise normal ECG  Similar to prior EKGs Confirmed by Vivi BarrackNaasz, Sonjia Wilcoxson [phone]([id number]) on 06/11/2022 10:15:25 PM  Radiology DG Chest 2 View  Result Date: 06/11/2022 CLINICAL DATA:  Shortness of breath EXAM: CHEST - 2 VIEW COMPARISON:  X-ray 09/08/2021 and older FINDINGS: No consolidation, pneumothorax or effusion. No edema. Normal cardiopericardial silhouette. Overlapping cardiac leads. Degenerative changes of the spine. Hyperinflation IMPRESSION: Hyperinflation.  No acute cardiopulmonary disease Electronically Signed   By: Karen KaysAshok  Gupta M.D.   On: 06/11/2022 17:30    Procedures Procedures    Medications Ordered in ED Medications  aspirin tablet 325 mg (325 mg Oral Given 06/11/22 1704)  lactated ringers bolus 1,000 mL (0 mLs Intravenous Stopped 06/11/22 2044)  ondansetron (ZOFRAN) injection 4 mg (4 mg Intravenous Given 06/11/22 1949)  acetaminophen (TYLENOL) tablet 1,000 mg (1,000 mg Oral Given 06/11/22 1948)  ketorolac (TORADOL) 15 MG/ML injection 15 mg (15 mg Intravenous Given 06/11/22 1949)  hydrOXYzine (ATARAX) tablet 25 mg (25 mg Oral Given 06/11/22 1948)  diazepam (VALIUM) injection 2.5 mg (2.5 mg Intravenous Given 06/11/22 2109)  metoCLOPramide (REGLAN) injection 10 mg (10 mg Intravenous Given 06/11/22 2110)  lactated ringers bolus 1,000 mL (0 mLs Intravenous Stopped 06/11/22 2157)    ED Course/ Medical Decision Making/ A&P                          Medical  Decision Making Amount and/or Complexity of Data Reviewed Labs: ordered. Decision-making details documented in ED Course.  Risk OTC drugs. Prescription drug management.    This patient presents to the ED for concern of chest pain, this involves an extensive number of treatment options, and is a complaint that carries with it a high risk of complications and morbidity.  I considered the following differential and admission for this acute, potentially life threatening condition.   MDM:    DDX for dyspnea, chest pain, generally feeling unwell includes but is not limited to:  Cardiac- CHF, Myocardial Ischemia, Valvular heart disease, Arrhythmia, Cardiac tamponade, aortic dissection Respiratory - viral URI, Pneumonia / atelectasis / pulmonary effusion / cavitary lung disease. Does have h/o COPD but no wheezing on exam. Patient cannot PERC out due to age, but no tachycardia/tachypnea/hypoxia, no signs/symptoms of DVT.  Patient also recently told he likely has HIV, consider Takutsubo cardiomyopathy or anxiety/panic attack, given that patient's affect is extremely anxious and distressed.    Clinical Course  as of 06/11/22 2216  Wed Jun 11, 2022  1909 HIV 1/2 Antibodies(!): Reactive [HN]  1909 B Natriuretic Peptide: 5.1 [HN]  1909 Troponin I (High Sensitivity): 3 [HN]  1909 Basic metabolic panel(!) unremarkable [HN]  2057 Informed paitent of his positive HIV screen and informed him that he will have the confirmatory test and will need further testing such as CD4 count and viral load. Patient reports he will walk into infectious disease clinic tomorrow. He states he still feels nauseated and dehydrated, he feels very upset and anxious about this news. Hydroxyzyine/zofran didn't help. Will give valium, reglan, and a 2nd liter of fluid, mucous membranes are still dry.  [HN]    Clinical Course User Index [HN] Loetta RoughNaasz, Chakara Bognar N, MD    Labs: I Ordered, and personally interpreted labs.  The  pertinent results include:  those listed above  Imaging Studies ordered: I ordered imaging studies including CXR I independently visualized and interpreted imaging. I agree with the radiologist interpretation  Additional history obtained from chart review, fiancee at bedside.   Cardiac Monitoring: The patient was maintained on a cardiac monitor.  I personally viewed and interpreted the cardiac monitored which showed an underlying rhythm of: NSR  Reevaluation: After the interventions noted above, I reevaluated the patient and found that they have :stayed the same  Social Determinants of Health: Patient lives independently  Disposition:  Patient very emotionally distressed about his HIV diagnosis. I provided sedation with valium and reassurance about the modern treatments of HIV and protection for partners and more positive outlook than it used to be. Patient states he will go to infectious disease clinic in the AM walk in as he was originally instructed. I informed him that he does still need confirmatory testing. Soon after this discussion, patient acutely stated that he needed to leave the ED. He was A&Ox4, and was AMA'd by Charity fundraiserN.  AMA    Co morbidities that complicate the patient evaluation  Past Medical History:  Diagnosis Date   Arthritis    Back pain    Bipolar disorder (HCC)    MI (myocardial infarction) (HCC)    at age 57   Polysubstance abuse (HCC)    TIA (transient ischemic attack)    Tobacco abuse      Medicines Meds ordered this encounter  Medications   aspirin tablet 325 mg   lactated ringers bolus 1,000 mL   ondansetron (ZOFRAN) injection 4 mg   acetaminophen (TYLENOL) tablet 1,000 mg   ketorolac (TORADOL) 15 MG/ML injection 15 mg   hydrOXYzine (ATARAX) tablet 25 mg   diazepam (VALIUM) injection 2.5 mg   metoCLOPramide (REGLAN) injection 10 mg   lactated ringers bolus 1,000 mL    I have reviewed the patients home medicines and have made adjustments as  needed  Problem List / ED Course: Problem List Items Addressed This Visit   None Visit Diagnoses     Chest pain, unspecified type    -  Primary   HIV infection, unspecified symptom status (HCC)                       This note was created using dictation software, which may contain spelling or grammatical errors.    Loetta RoughNaasz, Sharlize Hoar N, MD 06/23/22 [phone]

## 2022-06-11 NOTE — ED Triage Notes (Signed)
Pt arrived POV w/ c/o CP, dizziness, and shob. Onset 3-4 days ago. Started as shob, can't eat, can't sleep, can't breathe, feels like he is about to jump out of his throat. Pt is very anxious and antsy in triage w/ rapid speech. Pt denies any drug use. Pt reports onset of blurry vision and generalized weakness that started about 45 mins ago. CP is left sided w/ radiation to L arm and numbness to L hand.

## 2022-06-11 NOTE — ED Provider Triage Note (Signed)
Emergency Medicine Provider Triage Evaluation Note  [name] [name] , a 57 y.o. male  was evaluated in triage.  Pt complains of shortness of breath and chest pain for the last 3 days.  Having a productive cough.  Smokes cigarettes daily, trying to quit.  History of COPD does not take medicine for it.  Recently diagnosed with HIV, has not followed up with that doctor.  Denies any fevers or chills..  Review of Systems  Per HPI  Physical Exam  There were no vitals taken for this visit. Gen:   Awake, no distress   Resp:  Normal effort  MSK:   Moves extremities without difficulty  Other:    Medical Decision Making  Medically screening exam initiated at 4:44 PM.  Appropriate orders placed.  [name] [name] was informed that the remainder of the evaluation will be completed by another provider, this initial triage assessment does not replace that evaluation, and the importance of remaining in the ED until their evaluation is complete.     Sherrill Raring, PA-C 06/11/22 1645

## 2022-06-11 NOTE — ED Notes (Signed)
Unable to obtain NP swab. Patient uncooperative with process.

## 2022-06-11 NOTE — ED Notes (Signed)
Pt refuses to let this nurse get Covid/flu swab.

## 2022-06-13 LAB — HIV-1/2 AB - DIFFERENTIATION
HIV 1 Ab: REACTIVE — AB
HIV 2 Ab: NONREACTIVE

## 2022-06-16 ENCOUNTER — Other Ambulatory Visit: Payer: Self-pay

## 2022-06-16 ENCOUNTER — Other Ambulatory Visit: Payer: 59

## 2022-06-16 ENCOUNTER — Telehealth: Payer: Self-pay

## 2022-06-16 DIAGNOSIS — Z113 Encounter for screening for infections with a predominantly sexual mode of transmission: Secondary | ICD-10-CM

## 2022-06-16 NOTE — Telephone Encounter (Signed)
Called [name] [name] to schedule follow up since HIV-1 antibody is reactive. No answer and voicemail full.   Beryle Flock, RN

## 2022-06-19 ENCOUNTER — Telehealth: Payer: Self-pay

## 2022-06-19 ENCOUNTER — Other Ambulatory Visit: Payer: Self-pay | Admitting: Family

## 2022-06-19 DIAGNOSIS — B20 Human immunodeficiency virus [HIV] disease: Secondary | ICD-10-CM

## 2022-06-19 LAB — HIV-1 RNA QUANT-NO REFLEX-BLD
HIV 1 RNA Quant: 314000 Copies/mL — ABNORMAL HIGH
HIV-1 RNA Quant, Log: 5.5 Log cps/mL — ABNORMAL HIGH

## 2022-06-19 LAB — HIV-1/2 AB - DIFFERENTIATION
HIV-1 antibody: POSITIVE — AB
HIV-2 Ab: NEGATIVE

## 2022-06-19 LAB — HIV ANTIBODY (ROUTINE TESTING W REFLEX): HIV 1&2 Ab, 4th Generation: REACTIVE — AB

## 2022-06-19 NOTE — Telephone Encounter (Signed)
Terri Piedra, NP would like patient to come in sooner for CD4 count and to start on Biktarvy.   Beryle Flock, RN

## 2022-06-23 ENCOUNTER — Telehealth: Payer: Self-pay

## 2022-06-23 NOTE — Telephone Encounter (Signed)
Thank you :)

## 2022-06-23 NOTE — Telephone Encounter (Signed)
[facility] Patient Advocate Encounter   Was successful in obtaining a [employer] copay card for Biktarvy.  This copay card will make the patients copay 0.00.           Issa Kosmicki, CPhT Specialty Pharmacy Patient Advocate [facility] Phone: [phone] Fax:  [phone]  

## 2022-06-24 ENCOUNTER — Ambulatory Visit: Payer: 59 | Admitting: Pharmacist

## 2022-06-24 NOTE — Progress Notes (Unsigned)
06/24/2022  HPI: [name] [name] is a 57 y.o. male who presents to the [facility] clinic today to initiate care for a newly diagnosed HIV infection.  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   CARIPRAZINE (VRAYLAR) 3 MG CAPSULE    Take 3 mg by mouth daily.   CARVEDILOL (COREG) 3.125 MG TABLET    Take 3.125 mg by mouth once.   CLONAZEPAM (KLONOPIN) 0.5 MG TABLET    Take 0.5 mg by mouth daily.   CLONAZEPAM (KLONOPIN) 1 MG TABLET    Take 1 mg by mouth 4 (four) times daily.   IBUPROFEN (ADVIL) 600 MG TABLET    Take by mouth.   OXYCODONE HCL 10 MG TABS    Take 10 mg by mouth daily as needed (pain).  Modified Medications   No medications on file  Discontinued Medications   No medications on file    Allergies: Allergies  Allergen Reactions   Vicodin [Hydrocodone-Acetaminophen] Nausea And Vomiting    Past Medical History: Past Medical History:  Diagnosis Date   Arthritis    Back pain    Bipolar disorder (HCC)    MI (myocardial  infarction) (HCC)    at age 38   Polysubstance abuse (HCC)    TIA (transient ischemic attack)    Tobacco abuse     Social History: Social History   Socioeconomic History   Marital status: Single    Spouse name: Not on file   Number of children: Not on file   Years of education: Not on file   Highest education level: Not on file  Occupational History   Not on file  Tobacco Use   Smoking status: Every Day    Packs/day: 2.00    Years: 35.00    Additional pack years: 0.00    Total pack years: 70.00    Types: Cigarettes   Smokeless tobacco: Never   Tobacco comments:    6 cigs per day 08/07/20//lmr  Vaping Use   Vaping Use: Never used  Substance and Sexual Activity   Alcohol use: No   Drug use: Not Currently    Types: Cocaine, Marijuana, Methamphetamines    Comment: Hx of drug use   Sexual activity: Yes    Partners: Female  Other Topics Concern   Not on file  Social History Narrative   Not on file   Social Determinants of Health   Financial Resource Strain: Not on file  Food Insecurity: Not on file  Transportation Needs: Not on file  Physical Activity: Not on file  Stress: Not  on file  Social Connections: Not on file    Labs: Lab Results  Component Value Date   HIV1RNAQUANT 314,000 (H) 06/16/2022    RPR and STI No results found for: "LABRPR", "RPRTITER"      No data to display          Hepatitis B No results found for: "HEPBSAB", "HEPBSAG", "HEPBCAB" Hepatitis C No results found for: "HEPCAB", "HCVRNAPCRQN" Hepatitis A No results found for: "HAV" Lipids: Lab Results  Component Value Date   CHOL 192 07/02/2020   TRIG 76 07/02/2020   HDL 33 (L) 07/02/2020   CHOLHDL 5.8 07/02/2020   VLDL 15 07/02/2020   LDLCALC 144 (H) 07/02/2020    Current HIV Regimen: Treatment naive  Assessment: *** is here today to initiate care with *** for *** newly diagnosed HIV infection.  *** is treatment naive with an initial HIV viral load of *** and a CD4  count of ***.  No resistance mutations found on initial genotype. Will start patient on ***.  Plan: ***  Blane Ohara, PharmD  PGY1 Pharmacy Resident [facility]

## 2022-06-26 ENCOUNTER — Other Ambulatory Visit (HOSPITAL_COMMUNITY): Payer: Self-pay

## 2022-06-26 ENCOUNTER — Ambulatory Visit (INDEPENDENT_AMBULATORY_CARE_PROVIDER_SITE_OTHER): Payer: 59 | Admitting: Pharmacist

## 2022-06-26 ENCOUNTER — Other Ambulatory Visit: Payer: Self-pay

## 2022-06-26 DIAGNOSIS — B20 Human immunodeficiency virus [HIV] disease: Secondary | ICD-10-CM | POA: Diagnosis not present

## 2022-06-26 MED ORDER — BICTEGRAVIR-EMTRICITAB-TENOFOV 50-200-25 MG PO TABS: 1.0000 | ORAL_TABLET | Freq: Every day | ORAL | 0 refills | Status: AC

## 2022-06-26 MED ORDER — BICTEGRAVIR-EMTRICITAB-TENOFOV 50-200-25 MG PO TABS
1.0000 | ORAL_TABLET | Freq: Every day | ORAL | 1 refills | Status: DC
  Filled 2022-06-26 – 2022-06-27 (×2): qty 30, 30d supply, fill #0
  Filled 2022-06-30 – 2022-07-30 (×2): qty 30, 30d supply, fill #1

## 2022-06-26 NOTE — Progress Notes (Signed)
NEW REFERRAL TO CPP CLINIC  Medication Samples have been provided to the patient.  Drug name: Biktarvy        Strength: 50/200/25 mg       Qty: 7 tablets (1 bottles) LOT: [id number]   Exp.Date: 8/26  Dosing instructions: Take one tablet by mouth once daily  The patient has been instructed regarding the correct time, dose, and frequency of taking this medication, including desired effects and most common side effects.   Margarite Gouge, PharmD, CPP, BCIDP, AAHIVP Clinical Pharmacist Practitioner Infectious Diseases Clinical Pharmacist [facility]

## 2022-06-26 NOTE — Progress Notes (Signed)
06/26/2022  HPI: [name] [name] is a 57 y.o. male who presents to the [facility] clinic today to initiate care for a newly diagnosed HIV infection.  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   CARIPRAZINE (VRAYLAR) 3 MG CAPSULE    Take 3 mg by mouth daily.   CARVEDILOL (COREG) 3.125 MG TABLET    Take 3.125 mg by mouth once.   CLONAZEPAM (KLONOPIN) 0.5 MG TABLET    Take 0.5 mg by mouth daily.   CLONAZEPAM (KLONOPIN) 1 MG TABLET    Take 1 mg by mouth 4 (four) times daily.   IBUPROFEN (ADVIL) 600 MG TABLET    Take by mouth.   OXYCODONE HCL 10 MG TABS    Take 10 mg by mouth daily as needed (pain).  Modified Medications   No medications on file  Discontinued Medications   No medications on file    Allergies: Allergies  Allergen Reactions   Vicodin [Hydrocodone-Acetaminophen] Nausea And Vomiting    Past Medical History: Past Medical History:  Diagnosis Date   Arthritis    Back pain    Bipolar disorder (HCC)    MI (myocardial  infarction) (HCC)    at age 20   Polysubstance abuse (HCC)    TIA (transient ischemic attack)    Tobacco abuse     Social History: Social History   Socioeconomic History   Marital status: Single    Spouse name: Not on file   Number of children: Not on file   Years of education: Not on file   Highest education level: Not on file  Occupational History   Not on file  Tobacco Use   Smoking status: Every Day    Packs/day: 2.00    Years: 35.00    Additional pack years: 0.00    Total pack years: 70.00    Types: Cigarettes   Smokeless tobacco: Never   Tobacco comments:    6 cigs per day 08/07/20//lmr  Vaping Use   Vaping Use: Never used  Substance and Sexual Activity   Alcohol use: No   Drug use: Not Currently    Types: Cocaine, Marijuana, Methamphetamines    Comment: Hx of drug use   Sexual activity: Yes    Partners: Female  Other Topics Concern   Not on file  Social History Narrative   Not on file   Social Determinants of Health   Financial Resource Strain: Not on file  Food Insecurity: Not on file  Transportation Needs: Not on file  Physical Activity: Not on file  Stress: Not  on file  Social Connections: Not on file    Labs: Lab Results  Component Value Date   HIV1RNAQUANT 314,000 (H) 06/16/2022    RPR and STI No results found for: "LABRPR", "RPRTITER"      No data to display          Hepatitis B No results found for: "HEPBSAB", "HEPBSAG", "HEPBCAB" Hepatitis C No results found for: "HEPCAB", "HCVRNAPCRQN" Hepatitis A No results found for: "HAV" Lipids: Lab Results  Component Value Date   CHOL 192 07/02/2020   TRIG 76 07/02/2020   HDL 33 (L) 07/02/2020   CHOLHDL 5.8 07/02/2020   VLDL 15 07/02/2020   LDLCALC 144 (H) 07/02/2020    Current HIV Regimen: Treatment naive  Assessment: [name] [name] is here today to initiate care for his newly diagnosed HIV infection.  He is treatment naive with an initial HIV viral load of 314,000. He has been  feeling poorly for over a year and suspects that he has had HIV for a while. Will plan to initiate Biktarvy. He is wanting to start Biktarvy today, samples were provided. He opts to participate in [facility] mail order pharmacy, address confirmed. I did let him know to answer the phone as they will be calling him to confirm.   [name] [name] asked me to reset his MyChart password. I successfully downloaded MyChart on his phone and set up his account. He is worried about transmitting HIV to his partner [name] [name], who he brought to his appointment today. I spoke with [name] [name] about the options for PrEP and set her up a lab appointment to check her HIV status and hepatitis serologies.  He politely declines STI testing. Will measure CD4 count, HIV RNA with genotype, hepatitis A antibody, hepatitis B antibody and antigen, and hepatitis C antibody today.   Plan: - Start Biktarvy samples, start mail order supply when received  - Follow up CD4 count, HIV RNA with genotype, hepatitis A antibody, hepatitis B antibody and antigen, and hepatitis C antibody - Follow up with [name] [name] on May 15th, 2024   Blane Ohara, PharmD  PGY1 Pharmacy Resident

## 2022-06-27 ENCOUNTER — Other Ambulatory Visit (HOSPITAL_COMMUNITY): Payer: Self-pay

## 2022-06-27 ENCOUNTER — Other Ambulatory Visit: Payer: Self-pay

## 2022-06-27 LAB — HEPATITIS B SURFACE ANTIGEN: Hepatitis B Surface Ag: NONREACTIVE

## 2022-06-27 LAB — T-HELPER CELL (CD4) - (RCID CLINIC ONLY)
CD4 % Helper T Cell: 24 % — ABNORMAL LOW (ref 33–65)
CD4 T Cell Abs: 435 /uL (ref 400–1790)

## 2022-06-30 ENCOUNTER — Other Ambulatory Visit (HOSPITAL_COMMUNITY): Payer: Self-pay

## 2022-07-07 ENCOUNTER — Ambulatory Visit: Payer: 59 | Admitting: Family

## 2022-07-09 LAB — HIV-1 INTEGRASE GENOTYPE

## 2022-07-09 LAB — HIV-1 GENOTYPE: HIV-1 Genotype: DETECTED — AB

## 2022-07-09 LAB — HIV RNA, RTPCR W/R GT (RTI, PI,INT)
HIV 1 RNA Quant: 236000 copies/mL — ABNORMAL HIGH
HIV-1 RNA Quant, Log: 5.37 Log copies/mL — ABNORMAL HIGH

## 2022-07-09 LAB — HEPATITIS A ANTIBODY, TOTAL: Hepatitis A AB,Total: NONREACTIVE

## 2022-07-09 LAB — HEPATITIS C ANTIBODY: Hepatitis C Ab: NONREACTIVE

## 2022-07-09 LAB — HEPATITIS B SURFACE ANTIBODY,QUALITATIVE: Hep B S Ab: NONREACTIVE

## 2022-07-17 ENCOUNTER — Other Ambulatory Visit (HOSPITAL_COMMUNITY): Payer: Self-pay

## 2022-07-21 ENCOUNTER — Other Ambulatory Visit (HOSPITAL_COMMUNITY): Payer: Self-pay

## 2022-07-23 ENCOUNTER — Other Ambulatory Visit (HOSPITAL_COMMUNITY): Payer: Self-pay

## 2022-07-30 ENCOUNTER — Other Ambulatory Visit (HOSPITAL_COMMUNITY): Payer: Self-pay

## 2022-07-30 ENCOUNTER — Other Ambulatory Visit: Payer: Self-pay

## 2022-07-30 ENCOUNTER — Ambulatory Visit (INDEPENDENT_AMBULATORY_CARE_PROVIDER_SITE_OTHER): Payer: 59 | Admitting: Family

## 2022-07-30 ENCOUNTER — Encounter: Payer: Self-pay | Admitting: Family

## 2022-07-30 VITALS — BP 135/91 | HR 70 | Temp 98.2°F | Wt 173.0 lb

## 2022-07-30 DIAGNOSIS — Z720 Tobacco use: Secondary | ICD-10-CM

## 2022-07-30 DIAGNOSIS — F3170 Bipolar disorder, currently in remission, most recent episode unspecified: Secondary | ICD-10-CM

## 2022-07-30 DIAGNOSIS — B20 Human immunodeficiency virus [HIV] disease: Secondary | ICD-10-CM | POA: Diagnosis not present

## 2022-07-30 MED ORDER — BICTEGRAVIR-EMTRICITAB-TENOFOV 50-200-25 MG PO TABS
1.0000 | ORAL_TABLET | Freq: Every day | ORAL | 4 refills | Status: DC
Start: 2022-07-30 — End: 2022-11-20
  Filled 2022-07-31: qty 30, 30d supply, fill #0
  Filled 2022-08-20 – 2022-09-08 (×5): qty 30, 30d supply, fill #1
  Filled 2022-10-06: qty 30, 30d supply, fill #2
  Filled 2022-10-29: qty 30, 30d supply, fill #3

## 2022-07-30 NOTE — Assessment & Plan Note (Signed)
[name] [name] is a 57 y/o male with newly diagnosed HIV disease. Initial viral load was 314,000 with CD4 count 435. Genotype with no significant medication resistant mutations. Entered care at [facility] Stage 2. No history of opportunistic infection. Rapid started on Biktarvy. Tolerating Biktarvy with no adverse side effects. Reviewed initial lab work and introduced U=U. Check lab work today. Continue current dose of Biktarvy. Plan for follow up in 3 months or sooner if needed with lab work on the same day.   NOTE: [name] left the office without getting lab work drawn. Will attempt to bring him back for lab appointment.

## 2022-07-30 NOTE — Patient Instructions (Signed)
Nice to see you.  We will check your lab work today.  Continue to take your medication daily as prescribed.  Refills have been sent to the pharmacy.  Plan for follow up in 3 months or sooner if needed with lab work on the same day.  Have a great day and stay safe!  

## 2022-07-30 NOTE — Assessment & Plan Note (Signed)
[name] [name] uses tobacco daily. Discussed importance of tobacco cessation to reduce risk of cardiovascular disease, respiratory disease and malignancy in the future. In the pre-contemplation stage and not ready to quit.

## 2022-07-30 NOTE — Progress Notes (Signed)
Brief Narrative   Patient ID: [name] [name], male    DOB: 04/15/1965, 57 y.o.   MRN: [id number]  [name] [name] is a 57 y/o male diagnosed with HIV disease in April 2024 with risk factor of sexual contact. Initial viral load was 314,000 with CD4 count 435. Genotype with no significant medication resistant mutations. Entered care at [facility] Stage 2. No history of opportunistic infection. Rapid started on Biktarvy.   Subjective:    Chief Complaint  Patient presents with   Follow-up    HPI:  [name] [name] is a 58 y.o. male with HIV disease last seen by Margarite Gouge, PharmD, CPP on 06/26/22 for rapid start of medication with newly diagnosed HIV disease.Initial viral load was 314,000 with CD4 count 435. Genotype with no significant medication resistant mutations. Entered care at [facility] Stage 2. No history of opportunistic infection. Rapid started on Biktarvy. Negative for Hepatitis B and Hepatitis C and not immune to Hepatitis A or Hepatitis B.  Here today for 1st month follow up.  [name] [name] has been doing okay since last office visit and has done research online. Has been taking Biktarvy with no adverse side effects. No new concerns/complaints. Condoms and STD testing offered. Declines vaccinations.   Denies fevers, chills, night sweats, headaches, changes in vision, neck pain/stiffness, nausea, diarrhea, vomiting, lesions or rashes.    Allergies  Allergen Reactions   Vicodin [Hydrocodone-Acetaminophen] Nausea And Vomiting      Outpatient Medications Prior to Visit  Medication Sig Dispense Refill   cariprazine (VRAYLAR) 3 MG capsule Take 3 mg by mouth daily.     LORazepam (ATIVAN) 1 MG tablet Take by mouth.     Oxycodone HCl 10 MG TABS Take 10 mg by mouth daily as needed (pain).     bictegravir-emtricitabine-tenofovir AF (BIKTARVY) 50-200-25 MG TABS tablet Take 1 tablet by mouth daily. 30 tablet 1   clonazePAM (KLONOPIN) 0.5 MG tablet Take 0.5 mg by mouth daily.     aspirin 81 MG EC tablet Take 81  mg by mouth daily. (Patient not taking: Reported on 06/05/2022)     carvedilol (COREG) 3.125 MG tablet Take 3.125 mg by mouth once. (Patient not taking: Reported on 06/05/2022)     clonazePAM (KLONOPIN) 1 MG tablet Take 1 mg by mouth 4 (four) times daily. (Patient not taking: Reported on 06/05/2022)     ibuprofen (ADVIL) 600 MG tablet Take by mouth. (Patient not taking: Reported on 06/05/2022)     No facility-administered medications prior to visit.     Past Medical History:  Diagnosis Date   Arthritis    Back pain    Bipolar disorder (HCC)    MI (myocardial infarction) (HCC)    at age 33   Polysubstance abuse (HCC)    TIA (transient ischemic attack)    Tobacco abuse      Past Surgical History:  Procedure Laterality Date   FOOT SURGERY     IR RADIOLOGIST EVAL & MGMT  07/19/2020   PACEMAKER PLACEMENT        Review of Systems  Constitutional:  Negative for appetite change, chills, fatigue, fever and unexpected weight change.  Eyes:  Negative for visual disturbance.  Respiratory:  Negative for cough, chest tightness, shortness of breath and wheezing.   Cardiovascular:  Negative for chest pain and leg swelling.  Gastrointestinal:  Negative for abdominal pain, constipation, diarrhea, nausea and vomiting.  Genitourinary:  Negative for dysuria, flank pain, frequency, genital sores, hematuria and urgency.  Skin:  Negative for rash.  Allergic/Immunologic: Negative for immunocompromised state.  Neurological:  Negative for dizziness and headaches.      Objective:    BP (!) 135/91   Pulse 70   Temp 98.2 F (36.8 C) (Oral)   Wt 173 lb (78.5 kg)   SpO2 99%   BMI 24.13 kg/m  Nursing note and vital signs reviewed.  Physical Exam Constitutional:      General: He is not in acute distress.    Appearance: He is well-developed.     Comments: Seated in the chair; on his phone during the visit gambling.   Eyes:     Conjunctiva/sclera: Conjunctivae normal.  Cardiovascular:     Rate  and Rhythm: Normal rate and regular rhythm.     Heart sounds: Normal heart sounds. No murmur heard.    No friction rub. No gallop.  Pulmonary:     Effort: Pulmonary effort is normal. No respiratory distress.     Breath sounds: Normal breath sounds. No wheezing or rales.  Chest:     Chest wall: No tenderness.  Abdominal:     General: Bowel sounds are normal.     Palpations: Abdomen is soft.     Tenderness: There is no abdominal tenderness.  Musculoskeletal:     Cervical back: Neck supple.  Lymphadenopathy:     Cervical: No cervical adenopathy.  Skin:    General: Skin is warm and dry.     Findings: No rash.  Neurological:     Mental Status: He is alert and oriented to person, place, and time.  Psychiatric:        Behavior: Behavior normal.        Thought Content: Thought content normal.        Judgment: Judgment normal.         07/30/2022    3:29 PM 06/05/2022   11:13 AM 06/26/2014    9:51 AM  Depression screen PHQ 2/9  Decreased Interest 1 3 0  Down, Depressed, Hopeless 1 3 0  PHQ - 2 Score 2 6 0  Altered sleeping  3   Tired, decreased energy  3   Change in appetite  3   Feeling bad or failure about yourself   3   Trouble concentrating  3   Moving slowly or fidgety/restless  3   Suicidal thoughts  0   PHQ-9 Score  24   Difficult doing work/chores  Extremely dIfficult        Assessment & Plan:    Patient Active Problem List   Diagnosis Date Noted   HIV disease (HCC) 07/30/2022   Screening examination for venereal disease 06/06/2022   Cigarette smoker 08/07/2020   Blurry vision, left eye 07/01/2020   Precordial pain 05/10/2020   DOE (dyspnea on exertion)    Sedative, hypnotic, 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   Bipolar affective disorder in remission (HCC) 06/16/2015   ADD (attention deficit disorder) 09/06/2014   Anxiety 09/06/2014   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 Discussed importance of tobacco cessation to reduce risk of cardiovascular disease, respiratory disease and malignancy in the future. In the pre-contemplation stage and not ready to quit.       Bipolar affective disorder in remission [facility])    [name] [name] appears to have mulitple mental health diagnosis and does not appear to be on medication at the present time. Currently taking medication as prescribed but at increased risk of not taking medication when added to previous substance use. Will continue to monitor.       HIV disease (HCC) - Primary    [name] [name] is a 57 y/o male with newly diagnosed HIV disease. Initial viral load was 314,000 with CD4 count 435. Genotype with no significant medication resistant mutations. Entered care at [facility] Stage 2. No history of opportunistic infection. Rapid started on Biktarvy. Tolerating Biktarvy with no adverse side effects. Reviewed initial lab work and introduced U=U. Check lab work today. Continue current dose of Biktarvy. Plan for follow up in 3 months or sooner if needed with lab work on the same day.   NOTE: [name] [name] left the office without getting lab work drawn. Will attempt to bring him back for lab appointment.       Relevant Medications   bictegravir-emtricitabine-tenofovir AF (BIKTARVY) 50-200-25 MG TABS tablet   Other Relevant Orders   HIV-1 RNA quant-no reflex-bld   HLA B*5701   QuantiFERON-TB Gold Plus   T-helper cell (CD4)- ([facility] clinic only)     I have discontinued Molly Maduro K. Duty's  clonazePAM, aspirin EC, carvedilol, ibuprofen, and clonazePAM. I am also having him maintain his cariprazine, Oxycodone HCl, LORazepam, and bictegravir-emtricitabine-tenofovir AF.   Meds ordered this encounter  Medications   bictegravir-emtricitabine-tenofovir AF (BIKTARVY) 50-200-25 MG TABS tablet    Sig: Take 1 tablet by mouth daily.    Dispense:  30 tablet    Refill:  4    Patient prefers mailing.    Order Specific Question:   Supervising Provider    Answer:   Judyann Munson [[id number]]     Follow-up: Return in about 3 months (around 10/30/2022), or if symptoms worsen or fail to improve.   Marcos Eke, MSN, FNP-C Nurse Practitioner [facility] Main number: [phone]

## 2022-07-30 NOTE — Assessment & Plan Note (Signed)
Clearance appears to have mulitple mental health diagnosis and does not appear to be on medication at the present time. Currently taking medication as prescribed but at increased risk of not taking medication when added to previous substance use. Will continue to monitor.

## 2022-07-31 ENCOUNTER — Other Ambulatory Visit: Payer: Self-pay

## 2022-07-31 ENCOUNTER — Other Ambulatory Visit (HOSPITAL_COMMUNITY): Payer: Self-pay

## 2022-08-04 ENCOUNTER — Other Ambulatory Visit: Payer: Self-pay

## 2022-08-18 ENCOUNTER — Other Ambulatory Visit: Payer: Self-pay

## 2022-08-18 ENCOUNTER — Other Ambulatory Visit: Payer: Medicaid Other

## 2022-08-18 DIAGNOSIS — B20 Human immunodeficiency virus [HIV] disease: Secondary | ICD-10-CM

## 2022-08-19 LAB — T-HELPER CELL (CD4) - (RCID CLINIC ONLY)
CD4 % Helper T Cell: 33 % (ref 33–65)
CD4 T Cell Abs: 567 /uL (ref 400–1790)

## 2022-08-20 ENCOUNTER — Other Ambulatory Visit (HOSPITAL_COMMUNITY): Payer: Self-pay

## 2022-08-20 LAB — QUANTIFERON-TB GOLD PLUS: TB1-NIL: <0 IU/mL

## 2022-08-25 LAB — HLA B*5701: HLA-B*5701 w/rflx HLA-B High: NEGATIVE

## 2022-08-25 LAB — QUANTIFERON-TB GOLD PLUS
Mitogen-NIL: >10 IU/mL
NIL: 0.03 IU/mL
QuantiFERON-TB Gold Plus: NEGATIVE
TB2-NIL: <0 IU/mL

## 2022-08-25 LAB — HIV-1 RNA QUANT-NO REFLEX-BLD
HIV 1 RNA Quant: 79 Copies/mL — ABNORMAL HIGH
HIV-1 RNA Quant, Log: 1.9 Log cps/mL — ABNORMAL HIGH

## 2022-08-27 ENCOUNTER — Other Ambulatory Visit (HOSPITAL_COMMUNITY): Payer: Self-pay

## 2022-08-28 ENCOUNTER — Other Ambulatory Visit (HOSPITAL_COMMUNITY): Payer: Self-pay

## 2022-08-28 NOTE — Progress Notes (Signed)
The ASCVD Risk score (Arnett DK, et al., 2019) failed to calculate for the following reasons:   The patient has a prior MI or stroke diagnosis  Sandie Ano, RN

## 2022-08-29 ENCOUNTER — Other Ambulatory Visit (HOSPITAL_COMMUNITY): Payer: Self-pay

## 2022-09-01 ENCOUNTER — Other Ambulatory Visit (HOSPITAL_COMMUNITY): Payer: Self-pay

## 2022-09-01 NOTE — Telephone Encounter (Signed)
I dont think we should be tolerating this kind of language and verbal abuse in our clinic. Wanted to send to you all so you all are aware. Thanks

## 2022-09-02 ENCOUNTER — Other Ambulatory Visit (HOSPITAL_COMMUNITY): Payer: Self-pay

## 2022-09-04 ENCOUNTER — Other Ambulatory Visit (HOSPITAL_COMMUNITY): Payer: Self-pay

## 2022-09-05 ENCOUNTER — Other Ambulatory Visit: Payer: Self-pay | Admitting: Pharmacist

## 2022-09-05 DIAGNOSIS — B20 Human immunodeficiency virus [HIV] disease: Secondary | ICD-10-CM

## 2022-09-05 MED ORDER — BICTEGRAVIR-EMTRICITAB-TENOFOV 50-200-25 MG PO TABS
1.0000 | ORAL_TABLET | Freq: Every day | ORAL | 0 refills | Status: AC
Start: 2022-09-04 — End: 2022-09-11

## 2022-09-05 NOTE — Progress Notes (Signed)
Medication Samples have been provided to the patient.  Drug name: Biktarvy        Strength: 50/200/25 mg       Qty: 7 tablets (1 bottles) LOT: CPP2HA   Exp.Date: 8/26  Dosing instructions: Take one tablet by mouth once daily  The patient has been instructed regarding the correct time, dose, and frequency of taking this medication, including desired effects and most common side effects.   Raegen Tarpley, PharmD, CPP, BCIDP, AAHIVP Clinical Pharmacist Practitioner Infectious Diseases Clinical Pharmacist [facility]   

## 2022-09-08 ENCOUNTER — Telehealth: Payer: Self-pay

## 2022-09-08 ENCOUNTER — Other Ambulatory Visit (HOSPITAL_COMMUNITY): Payer: Self-pay

## 2022-09-08 NOTE — Telephone Encounter (Signed)
Pt left message about medicaid. I called he stated he could not get [facility] medicaid on phone. I advised him that I did but they could not update his information from me. Gave him the number listed on the back of his insurance card. He stated he would call them right now.

## 2022-09-11 ENCOUNTER — Other Ambulatory Visit (HOSPITAL_COMMUNITY): Payer: Self-pay

## 2022-09-22 ENCOUNTER — Emergency Department (HOSPITAL_COMMUNITY): Payer: Medicaid Other

## 2022-09-22 ENCOUNTER — Other Ambulatory Visit: Payer: Self-pay

## 2022-09-22 ENCOUNTER — Encounter (HOSPITAL_COMMUNITY): Payer: Self-pay

## 2022-09-22 ENCOUNTER — Emergency Department (HOSPITAL_COMMUNITY)
Admission: EM | Admit: 2022-09-22 | Discharge: 2022-09-22 | Disposition: A | Discharge status: Home / Self Care | Payer: Medicaid Other | Attending: Emergency Medicine | Admitting: Emergency Medicine

## 2022-09-22 DIAGNOSIS — H538 Other visual disturbances: Secondary | ICD-10-CM | POA: Insufficient documentation

## 2022-09-22 DIAGNOSIS — R200 Anesthesia of skin: Secondary | ICD-10-CM | POA: Insufficient documentation

## 2022-09-22 DIAGNOSIS — R079 Chest pain, unspecified: Secondary | ICD-10-CM | POA: Insufficient documentation

## 2022-09-22 DIAGNOSIS — B20 Human immunodeficiency virus [HIV] disease: Secondary | ICD-10-CM | POA: Diagnosis not present

## 2022-09-22 DIAGNOSIS — R519 Headache, unspecified: Secondary | ICD-10-CM | POA: Insufficient documentation

## 2022-09-22 LAB — CBC
HCT: 45 % (ref 39.0–52.0)
Hemoglobin: 15 g/dL (ref 13.0–17.0)
MCH: 29 pg (ref 26.0–34.0)
MCHC: 33.3 g/dL (ref 30.0–36.0)
MCV: 86.9 fL (ref 80.0–100.0)
Platelets: 253 10*3/uL (ref 150–400)
RBC: 5.18 MIL/uL (ref 4.22–5.81)
RDW: 15.3 % (ref 11.5–15.5)
WBC: 7.2 10*3/uL (ref 4.0–10.5)
nRBC: 0 % (ref 0.0–0.2)

## 2022-09-22 LAB — BASIC METABOLIC PANEL
Anion gap: 14 (ref 5–15)
BUN: 12 mg/dL (ref 6–20)
CO2: 24 mmol/L (ref 22–32)
Calcium: 9.1 mg/dL (ref 8.9–10.3)
Chloride: 103 mmol/L (ref 98–111)
Creatinine, Ser: 0.95 mg/dL (ref 0.61–1.24)
GFR, Estimated: >60 mL/min (ref >60)
Glucose, Bld: 114 mg/dL — ABNORMAL HIGH (ref 70–99)
Potassium: 3.7 mmol/L (ref 3.5–5.1)
Sodium: 141 mmol/L (ref 135–145)

## 2022-09-22 LAB — HEPATIC FUNCTION PANEL
ALT: 24 U/L (ref 0–44)
AST: 27 U/L (ref 15–41)
Albumin: 3.5 g/dL (ref 3.5–5.0)
Alkaline Phosphatase: 77 U/L (ref 38–126)
Bilirubin, Direct: <0.1 mg/dL (ref 0.0–0.2)
Total Bilirubin: 0.5 mg/dL (ref 0.3–1.2)
Total Protein: 6.4 g/dL — ABNORMAL LOW (ref 6.5–8.1)

## 2022-09-22 LAB — TROPONIN I (HIGH SENSITIVITY)
Troponin I (High Sensitivity): 3 ng/L (ref <18)
Troponin I (High Sensitivity): 3 ng/L (ref <18)

## 2022-09-22 MED ORDER — DIAZEPAM 5 MG/ML IJ SOLN
5.0000 mg | Freq: Once | INTRAMUSCULAR | Status: AC
  Administered 2022-09-22: 5 mg via INTRAVENOUS
  Filled 2022-09-22: qty 2

## 2022-09-22 MED ORDER — SODIUM CHLORIDE 0.9 % IV BOLUS
1000.0000 mL | Freq: Once | INTRAVENOUS | Status: AC
  Administered 2022-09-22: 1000 mL via INTRAVENOUS

## 2022-09-22 MED ORDER — KETOROLAC TROMETHAMINE 15 MG/ML IJ SOLN
15.0000 mg | Freq: Once | INTRAMUSCULAR | Status: AC
  Administered 2022-09-22: 15 mg via INTRAVENOUS
  Filled 2022-09-22: qty 1

## 2022-09-22 MED ORDER — DEXAMETHASONE SODIUM PHOSPHATE 10 MG/ML IJ SOLN
10.0000 mg | Freq: Once | INTRAMUSCULAR | Status: AC
  Administered 2022-09-22: 10 mg via INTRAVENOUS
  Filled 2022-09-22: qty 1

## 2022-09-22 NOTE — Discharge Instructions (Signed)
Recheck with your primary care provider.  Return to the ER for worsening or concerning symptoms. 

## 2022-09-22 NOTE — ED Provider Notes (Signed)
[place] EMERGENCY DEPARTMENT AT [facility] Provider Note   CSN: [id number] Arrival date & time: 09/22/22  0053     History  Chief Complaint  Patient presents with   Headache    [name] [name] is a 57 y.o. male.  57 year old male with history of MI, TIA, bipolar disorder, polysubstance abuse, recently diagnosed with HIV, presents with concern for headache onset 1 week ago. Reports intermittent left side facial numbness with left side facial pain, pain radiates down to left side check and left arm, also blurry vision when the headache is present. Reports similar headaches several years ago. Family was concerned for the left side facial numbness and brought him to the ER.       Home Medications Prior to Admission medications   Medication Sig Start Date End Date Taking? Authorizing Provider  bictegravir-emtricitabine-tenofovir AF (BIKTARVY) 50-200-25 MG TABS tablet Take 1 tablet by mouth daily. 07/30/22   Veryl Speak, FNP  cariprazine (VRAYLAR) 3 MG capsule Take 3 mg by mouth daily.    [provider]  LORazepam (ATIVAN) 1 MG tablet Take by mouth. 07/18/22   [provider]  Oxycodone HCl 10 MG TABS Take 10 mg by mouth daily as needed (pain).    [provider]      Allergies    Vicodin [hydrocodone-acetaminophen]    Review of Systems   Review of Systems Negative except as per HPI Physical Exam Updated Vital Signs BP 123/80 (BP Location: Right Arm)   Pulse (!) 104   Temp 98.8 F (37.1 C) (Oral)   Resp 19   Ht 5\' 11"  (1.803 m)   Wt 81.6 kg   SpO2 100%   BMI 25.10 kg/m  Physical Exam Vitals and nursing note reviewed.  Constitutional:      General: He is not in acute distress.    Appearance: He is well-developed. He is not diaphoretic.  HENT:     Head: Normocephalic and atraumatic.     Mouth/Throat:     Mouth: Mucous membranes are moist.  Eyes:     Extraocular Movements: Extraocular movements intact.     Pupils: Pupils  are equal, round, and reactive to light.  Cardiovascular:     Rate and Rhythm: Normal rate and regular rhythm.     Heart sounds: Normal heart sounds.  Pulmonary:     Effort: Pulmonary effort is normal.     Breath sounds: Normal breath sounds.  Musculoskeletal:     Cervical back: Neck supple.  Neurological:     Mental Status: He is alert and oriented to person, place, and time.     GCS: GCS eye subscore is 4. GCS verbal subscore is 5. GCS motor subscore is 6.     Cranial Nerves: No cranial nerve deficit or facial asymmetry.     Sensory: No sensory deficit.     Motor: No weakness.     Gait: Gait normal.     Comments: When pronator drift is assessed, patient leans back in bed with left arm slight downward drift and left arm slight upward drift.  Psychiatric:        Behavior: Behavior normal.     ED Results / Procedures / Treatments   Labs (all labs ordered are listed, but only abnormal results are displayed) Labs Reviewed  BASIC METABOLIC PANEL - Abnormal; Notable for the following components:      Result Value   Glucose, Bld 114 (*)    All other  components within normal limits  HEPATIC FUNCTION PANEL - Abnormal; Notable for the following components:   Total Protein 6.4 (*)    All other components within normal limits  CBC  TROPONIN I (HIGH SENSITIVITY)  TROPONIN I (HIGH SENSITIVITY)    EKG None  Radiology MR BRAIN WO CONTRAST  Result Date: 09/22/2022 CLINICAL DATA:  Headache with neuro deficit EXAM: MRI HEAD WITHOUT CONTRAST TECHNIQUE: Multiplanar, multiecho pulse sequences of the brain and surrounding structures were obtained without intravenous contrast. COMPARISON:  Head CT from 04/04/2021 FINDINGS: Brain: No acute infarction, hemorrhage, hydrocephalus, extra-axial collection or mass lesion. Vascular: Normal flow voids. Skull and upper cervical spine: Normal marrow signal. Sinuses/Orbits: Negative. Other: Intermittent mild motion artifact. IMPRESSION: Unremarkable brain  MRI.  No explanation for symptoms. Electronically Signed   By: Tiburcio Pea M.D.   On: 09/22/2022 05:41   DG Chest 2 View  Result Date: 09/22/2022 CLINICAL DATA:  Chest pain EXAM: CHEST - 2 VIEW COMPARISON:  06/11/2022 FINDINGS: Hyperinflation, stable. Heart and mediastinal contours are within normal limits. No focal opacities or effusions. No acute bony abnormality. IMPRESSION: Hyperinflation.  No active cardiopulmonary disease. Electronically Signed   By: Charlett Nose M.D.   On: 09/22/2022 01:40    Procedures Procedures    Medications Ordered in ED Medications  sodium chloride 0.9 % bolus 1,000 mL (0 mLs Intravenous Stopped 09/22/22 0606)  diazepam (VALIUM) injection 5 mg (5 mg Intravenous Given 09/22/22 0442)  ketorolac (TORADOL) 15 MG/ML injection 15 mg (15 mg Intravenous Given 09/22/22 0606)  dexamethasone (DECADRON) injection 10 mg (10 mg Intravenous Given 09/22/22 0605)    ED Course/ Medical Decision Making/ A&P                             Medical Decision Making Amount and/or Complexity of Data Reviewed Labs: ordered. Radiology: ordered.  Risk Prescription drug management.   This patient presents to the ED for concern of headache, left side facial numbness, chest pain, this involves an extensive number of treatment options, and is a complaint that carries with it a high risk of complications and morbidity.  The differential diagnosis includes but not limited to TIA, CVA, ACS, brain lesion, migraine   Co morbidities that complicate the patient evaluation  HIV, MI, TIA, tobacco use   Additional history obtained:  External records from outside source obtained and reviewed including recent labs from [facility] with HIV RNA down trending, currently 79. Quantiferon gold negative. CD4 567   Lab Tests:  I Ordered, and personally interpreted labs.  The pertinent results include:  CBC WNL, CMP without significant findings, troponin x 2 unremarkable.    Imaging Studies ordered:  I  ordered imaging studies including CXR, MRI brain  I independently visualized and interpreted imaging which showed CXR without acute findings. MRI normal- no evidence of CVA I agree with the radiologist interpretation   Cardiac Monitoring: / EKG:  The patient was maintained on a cardiac monitor.  I personally viewed and interpreted the cardiac monitored which showed an underlying rhythm of: Normal sinus rhythm, rate 95  Problem List / ED Course / Critical interventions / Medication management  57 yo male with primary complaint of left-sided headache with intermittent left facial numbness intermittent for 1 week.  Also left-sided chest pain.  Workup today is reassuring including no significant lab findings, troponin x 2 negative, EKG without ischemic changes.  MRI brain is normal.  Patient provided with Valium, continues  to have headache, provided with Toradol and Decadron just prior to discharge.  Encourage patient to follow-up with his infectious disease provider as he is concerned that the Susanne Borders is causing him to have the headaches.  Also advised to follow-up with PCP. I ordered medication including valium  for headache  Reevaluation of the patient after these medicines showed that the patient stayed the same I have reviewed the patients home medicines and have made adjustments as needed   Social Determinants of Health:  Has PCP   Test / Admission - Considered:  Disposition pending at time of signout to oncoming provider         Final Clinical Impression(s) / ED Diagnoses Final diagnoses:  Bad headache  Chest pain, unspecified type    Rx / DC Orders ED Discharge Orders     None         Jeannie Fend, PA-C 09/22/22 1610    Marily Memos, MD 09/22/22 [phone]

## 2022-09-22 NOTE — ED Triage Notes (Signed)
Complaining of sharp pain on the left parietal area of his head that radiates down to the left chest and causing the left arm to tingle. He also said that the headache causes neck to hurt and makes his stomach hurt some times.  This has been going on for a week.

## 2022-10-02 ENCOUNTER — Other Ambulatory Visit: Payer: Self-pay

## 2022-10-06 ENCOUNTER — Other Ambulatory Visit: Payer: Self-pay

## 2022-10-07 ENCOUNTER — Other Ambulatory Visit (HOSPITAL_COMMUNITY): Payer: Self-pay

## 2022-10-23 ENCOUNTER — Ambulatory Visit: Payer: Medicaid Other | Admitting: Family

## 2022-10-29 ENCOUNTER — Other Ambulatory Visit (HOSPITAL_COMMUNITY): Payer: Self-pay

## 2022-11-05 ENCOUNTER — Other Ambulatory Visit (HOSPITAL_COMMUNITY): Payer: Self-pay

## 2022-11-19 ENCOUNTER — Ambulatory Visit (INDEPENDENT_AMBULATORY_CARE_PROVIDER_SITE_OTHER): Payer: Medicaid Other | Admitting: Family

## 2022-11-19 ENCOUNTER — Encounter: Payer: Self-pay | Admitting: Family

## 2022-11-19 ENCOUNTER — Other Ambulatory Visit: Payer: Self-pay

## 2022-11-19 VITALS — BP 112/79 | HR 101 | Temp 98.2°F | Resp 16 | Wt 179.0 lb

## 2022-11-19 DIAGNOSIS — Z113 Encounter for screening for infections with a predominantly sexual mode of transmission: Secondary | ICD-10-CM

## 2022-11-19 DIAGNOSIS — B20 Human immunodeficiency virus [HIV] disease: Secondary | ICD-10-CM | POA: Diagnosis present

## 2022-11-19 DIAGNOSIS — F17210 Nicotine dependence, cigarettes, uncomplicated: Secondary | ICD-10-CM

## 2022-11-19 DIAGNOSIS — Z0000 Encounter for general adult medical examination without abnormal findings: Secondary | ICD-10-CM

## 2022-11-19 NOTE — Progress Notes (Unsigned)
Brief Narrative   Patient ID: [name] [name], male    DOB: February 21, 1966, 57 y.o.   MRN: [id number]  Asahi is a 57 y/o male diagnosed with HIV disease in April 2024 with risk factor of sexual contact. Initial viral load was 314,000 with CD4 count 435. Genotype with no significant medication resistant mutations. Entered care at [facility] Stage 2. No history of opportunistic infection. Rapid started on Biktarvy.   Subjective:    Chief Complaint  Patient presents with   Follow-up    B20 - patient reports headaches and low appetite x 2 months.     HPI:  [name] [name] is a 58 y.o. male with HIV disease last seen on 07/30/22 with good adherence and tolerance to Biktarvy. Last viral load was with low level viremia and undetectable and at 79 with CD4 count 567. Has been seen in the ED for bad headache on 7/8 with normal MRI and recommended follow up with ID as could be medication related. Here today for follow up.  Mr. Gish has been doing okay since his last office visit and continues to have a left sided headache that is unchanged since being seen in the ED. Denies photophobia or changes in vision or neck pain. Continues to take Biktarvy as prescribed with no adverse side effects or problems obtaining medication.  Continues to smoke tobacco and marijuana. Condoms and STD testing offered. Working on establishing with Internal Medicine. Healthcare maintenance due includes pneumococcal, Covid, and influenza vaccinations.   Denies fevers, chills, night sweats, changes in vision, neck pain/stiffness, nausea, diarrhea, vomiting, lesions or rashes.   Allergies  Allergen Reactions   Vicodin [Hydrocodone-Acetaminophen] Nausea And Vomiting      Outpatient Medications Prior to Visit  Medication Sig Dispense Refill   cariprazine (VRAYLAR) 3 MG capsule Take 3 mg by mouth daily.     clonazePAM (KLONOPIN) 0.5 MG tablet Take by mouth.     bictegravir-emtricitabine-tenofovir AF (BIKTARVY) 50-200-25 MG TABS  tablet Take 1 tablet by mouth daily. 30 tablet 4   LORazepam (ATIVAN) 1 MG tablet Take by mouth.     Oxycodone HCl 10 MG TABS Take 10 mg by mouth daily as needed (pain). (Patient not taking: Reported on 11/19/2022)     No facility-administered medications prior to visit.     Past Medical History:  Diagnosis Date   Arthritis    Back pain    Bipolar disorder (HCC)    MI (myocardial infarction) (HCC)    at age 29   Polysubstance abuse (HCC)    TIA (transient ischemic attack)    Tobacco abuse      Past Surgical History:  Procedure Laterality Date   FOOT SURGERY     IR RADIOLOGIST EVAL & MGMT  07/19/2020   PACEMAKER PLACEMENT        Review of Systems  Constitutional:  Negative for appetite change, chills, fatigue, fever and unexpected weight change.  Eyes:  Negative for visual disturbance.  Respiratory:  Negative for cough, chest tightness, shortness of breath and wheezing.   Cardiovascular:  Negative for chest pain and leg swelling.  Gastrointestinal:  Negative for abdominal pain, constipation, diarrhea, nausea and vomiting.  Genitourinary:  Negative for dysuria, flank pain, frequency, genital sores, hematuria and urgency.  Skin:  Negative for rash.  Allergic/Immunologic: Negative for immunocompromised state.  Neurological:  Negative for dizziness and headaches.      Objective:    BP 112/79   Pulse (!) 101   Temp 98.2  F (36.8 C) (Oral)   Resp 16   Wt 179 lb (81.2 kg)   SpO2 96%   BMI 24.97 kg/m  Nursing note and vital signs reviewed.  Physical Exam Constitutional:      General: He is not in acute distress.    Appearance: He is well-developed.  Eyes:     Conjunctiva/sclera: Conjunctivae normal.  Cardiovascular:     Rate and Rhythm: Normal rate and regular rhythm.     Heart sounds: Normal heart sounds. No murmur heard.    No friction rub. No gallop.  Pulmonary:     Effort: Pulmonary effort is normal. No respiratory distress.     Breath sounds: Normal breath  sounds. No wheezing or rales.  Chest:     Chest wall: No tenderness.  Abdominal:     General: Bowel sounds are normal.     Palpations: Abdomen is soft.     Tenderness: There is no abdominal tenderness.  Musculoskeletal:     Cervical back: Neck supple.  Lymphadenopathy:     Cervical: No cervical adenopathy.  Skin:    General: Skin is warm and dry.     Findings: No rash.  Neurological:     Mental Status: He is alert and oriented to person, place, and time.  Psychiatric:        Behavior: Behavior normal.        Thought Content: Thought content normal.        Judgment: Judgment normal.         11/19/2022    2:01 PM 07/30/2022    3:29 PM 06/05/2022   11:13 AM 06/26/2014    9:51 AM  Depression screen PHQ 2/9  Decreased Interest 0 1 3 0  Down, Depressed, Hopeless 0 1 3 0  PHQ - 2 Score 0 2 6 0  Altered sleeping   3   Tired, decreased energy   3   Change in appetite   3   Feeling bad or failure about yourself    3   Trouble concentrating   3   Moving slowly or fidgety/restless   3   Suicidal thoughts   0   PHQ-9 Score   24   Difficult doing work/chores   Extremely dIfficult        Assessment & Plan:    Patient Active Problem List   Diagnosis Date Noted   Healthcare maintenance 11/20/2022   HIV disease (HCC) 07/30/2022   Screening examination for venereal disease 06/06/2022   Cigarette smoker 08/07/2020   Blurry vision, left eye 07/01/2020   Precordial pain 05/10/2020   DOE (dyspnea on exertion)    Sedative, hypnotic, 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   Bipolar affective disorder in remission (HCC) 06/16/2015   ADD (attention deficit disorder) 09/06/2014   Anxiety 09/06/2014   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 Broccoli continues to smoke tobacco. Discussed risks associated with continued use and counseled on tobacco cessation. Not ready to quit at this  time.       HIV disease [facility])    Mr. Harig continues to have well controlled virus with good adherence and tolerance to [place]. Reviewed previous lab work and discussed plan of care and U equals U. Unclear if Susanne Borders is contributing to his headaches and discussed the only way to check is to change medications. Sample of Dovato provided and discussed potential side effects. If headaches improve will continue with Dovato. He will contact office regarding symptoms in a 1-2 weeks. Plan for follow up in 3 months or sooner if needed with lab work on the same day.      Relevant Medications   dolutegravir-lamiVUDine (DOVATO) 50-300 MG tablet   Other Relevant Orders   COMPLETE METABOLIC PANEL WITH GFR (Completed)   T-helper cell (CD4)- ([facility] clinic only)   HIV-1 RNA quant-no reflex-bld   Healthcare maintenance    Discussed importance of safe sexual practice and condom use. Condoms and STD testing offered.  Declines vaccinations. Scheduling appointment for routine dental care and working on establishing with Internal Medicine.       Other Visit Diagnoses     Screening for STDs (sexually transmitted diseases)    -  Primary   Relevant Orders   RPR        I have discontinued Lavan K. Dobberstein's LORazepam and bictegravir-emtricitabine-tenofovir AF. I am also having him start on Dovato. Additionally, I am having him maintain his cariprazine, Oxycodone HCl, and clonazePAM.   Meds ordered this encounter  Medications   dolutegravir-lamiVUDine (DOVATO) 50-300 MG tablet    Sig: Take 1  tablet by mouth daily.    Dispense:  14 tablet    Refill:  0    Order Specific Question:   Supervising Provider    Answer:   Judyann Munson [[id number]]     Follow-up: Return in about 4 weeks (around 12/17/2022).   Marcos Eke, MSN, FNP-C Nurse Practitioner [facility] for Infectious Disease [facility] Main number: [phone]

## 2022-11-19 NOTE — Patient Instructions (Addendum)
Nice to see you.  We will check your lab work today.  Continue to take your medication daily as prescribed.  Plan for follow up in 4 months or sooner if needed with lab work on the same day.  Have a great day and stay safe!  

## 2022-11-20 ENCOUNTER — Encounter: Payer: Self-pay | Admitting: Family

## 2022-11-20 DIAGNOSIS — Z0000 Encounter for general adult medical examination without abnormal findings: Secondary | ICD-10-CM | POA: Insufficient documentation

## 2022-11-20 LAB — T-HELPER CELL (CD4) - (RCID CLINIC ONLY)
CD4 % Helper T Cell: 38 % (ref 33–65)
CD4 T Cell Abs: 647 /uL (ref 400–1790)

## 2022-11-20 MED ORDER — DOVATO 50-300 MG PO TABS: 1.0000 | ORAL_TABLET | Freq: Every day | ORAL | 0 refills | Status: DC

## 2022-11-20 NOTE — Assessment & Plan Note (Signed)
Discussed importance of safe sexual practice and condom use. Condoms and STD testing offered.  Declines vaccinations. Scheduling appointment for routine dental care and working on establishing with Internal Medicine.

## 2022-11-20 NOTE — Assessment & Plan Note (Signed)
[name] [name] continues to smoke tobacco. Discussed risks associated with continued use and counseled on tobacco cessation. Not ready to quit at this time.

## 2022-11-20 NOTE — Assessment & Plan Note (Signed)
Mr. Conder continues to have well controlled virus with good adherence and tolerance to [place]. Reviewed previous lab work and discussed plan of care and U equals U. Unclear if Susanne Borders is contributing to his headaches and discussed the only way to check is to change medications. Sample of Dovato provided and discussed potential side effects. If headaches improve will continue with Dovato. He will contact office regarding symptoms in a 1-2 weeks. Plan for follow up in 3 months or sooner if needed with lab work on the same day.

## 2022-11-21 LAB — COMPLETE METABOLIC PANEL WITH GFR
AG Ratio: 1.8 (calc) (ref 1.0–2.5)
ALT: 14 U/L (ref 9–46)
AST: 16 U/L (ref 10–35)
Albumin: 4.4 g/dL (ref 3.6–5.1)
Alkaline phosphatase (APISO): 86 U/L (ref 35–144)
BUN: 10 mg/dL (ref 7–25)
CO2: 24 mmol/L (ref 20–32)
Calcium: 9.7 mg/dL (ref 8.6–10.3)
Chloride: 105 mmol/L (ref 98–110)
Creat: 0.99 mg/dL (ref 0.70–1.30)
Globulin: 2.4 g/dL (ref 1.9–3.7)
Glucose, Bld: 59 mg/dL — ABNORMAL LOW (ref 65–99)
Potassium: 3.9 mmol/L (ref 3.5–5.3)
Sodium: 140 mmol/L (ref 135–146)
Total Bilirubin: 0.4 mg/dL (ref 0.2–1.2)
Total Protein: 6.8 g/dL (ref 6.1–8.1)
eGFR: 90 mL/min/{1.73_m2} (ref >=60)

## 2022-11-21 LAB — T PALLIDUM AB: T Pallidum Abs: POSITIVE — AB

## 2022-11-21 LAB — HIV-1 RNA QUANT-NO REFLEX-BLD
HIV 1 RNA Quant: NOT DETECTED {copies}/mL
HIV-1 RNA Quant, Log: NOT DETECTED {Log_copies}/mL

## 2022-11-21 LAB — RPR: RPR Ser Ql: REACTIVE — AB

## 2022-11-21 LAB — RPR TITER: RPR Titer: 1:1 {titer} — ABNORMAL HIGH

## 2022-11-25 ENCOUNTER — Other Ambulatory Visit (HOSPITAL_COMMUNITY): Payer: Self-pay

## 2022-11-27 ENCOUNTER — Telehealth: Payer: Self-pay | Admitting: Pharmacist

## 2022-11-27 ENCOUNTER — Other Ambulatory Visit (HOSPITAL_COMMUNITY): Payer: Self-pay

## 2022-11-27 DIAGNOSIS — B20 Human immunodeficiency virus [HIV] disease: Secondary | ICD-10-CM

## 2022-11-27 MED ORDER — DOVATO 50-300 MG PO TABS
1.0000 | ORAL_TABLET | Freq: Every day | ORAL | Status: DC
Start: 2022-11-19 — End: 2023-04-09

## 2022-11-27 NOTE — Telephone Encounter (Signed)
Medication Samples have been provided to the patient.  Drug name: Dovato        Strength: 50/300 mg         Qty: 14  Tablets (1 bottles) LOT: [id number]   Exp.Date: 11/25  Dosing instructions: Take one tablet by mouth once daily  The patient has been instructed regarding the correct time, dose, and frequency of taking this medication, including desired effects and most common side effects.   Amanda Wolfe, PharmD, CPP, BCIDP, AAHIVP Clinical Pharmacist Practitioner Infectious Diseases Clinical Pharmacist [facility]  

## 2022-12-01 ENCOUNTER — Other Ambulatory Visit: Payer: Self-pay

## 2022-12-01 ENCOUNTER — Other Ambulatory Visit (HOSPITAL_COMMUNITY): Payer: Self-pay

## 2022-12-01 MED ORDER — DOVATO 50-300 MG PO TABS
1.0000 | ORAL_TABLET | Freq: Every day | ORAL | 5 refills | Status: DC
  Filled 2022-12-01: qty 30, 30d supply, fill #0
  Filled 2022-12-26 (×2): qty 30, 30d supply, fill #1
  Filled 2023-01-25: qty 30, 30d supply, fill #2
  Filled 2023-02-09: qty 30, 30d supply, fill #3
  Filled 2023-03-09 (×2): qty 30, 30d supply, fill #4
  Filled 2023-03-31 – 2023-04-09 (×2): qty 30, 30d supply, fill #5

## 2022-12-02 ENCOUNTER — Other Ambulatory Visit: Payer: Self-pay

## 2022-12-24 ENCOUNTER — Other Ambulatory Visit: Payer: Self-pay

## 2022-12-26 ENCOUNTER — Other Ambulatory Visit (HOSPITAL_COMMUNITY): Payer: Self-pay

## 2022-12-26 ENCOUNTER — Other Ambulatory Visit (HOSPITAL_COMMUNITY): Payer: Self-pay | Admitting: Pharmacy Technician

## 2022-12-26 ENCOUNTER — Other Ambulatory Visit: Payer: Self-pay

## 2022-12-26 NOTE — Progress Notes (Signed)
Specialty Pharmacy Refill Coordination Note  DEVARIO BUCKLEW is a 57 y.o. male contacted today regarding refills of specialty medication(s) Dolutegravir-Lamivudine   Patient requested Delivery   Delivery date: 01/04/23    Verified address: [address] [place]   Medication will be filled on 01/01/23.   Delivery Date 01/02/23 Since 01/04/23 is the weekend.

## 2023-01-01 ENCOUNTER — Other Ambulatory Visit: Payer: Self-pay

## 2023-01-21 ENCOUNTER — Other Ambulatory Visit: Payer: Self-pay

## 2023-01-23 ENCOUNTER — Other Ambulatory Visit: Payer: Self-pay

## 2023-01-26 ENCOUNTER — Other Ambulatory Visit: Payer: Self-pay

## 2023-01-26 NOTE — Progress Notes (Signed)
Specialty Pharmacy Refill Coordination Note  [name] [name] is a 57 y.o. male contacted today regarding refills of specialty medication(s) Dolutegravir-Lamivudine   Patient requested Delivery   Delivery date: 01/27/23   Verified address: [address] [place], [place] [address]   Medication will be filled on 01/26/23.

## 2023-02-09 ENCOUNTER — Other Ambulatory Visit (HOSPITAL_COMMUNITY): Payer: Self-pay

## 2023-02-09 ENCOUNTER — Other Ambulatory Visit (HOSPITAL_COMMUNITY): Payer: Self-pay | Admitting: Pharmacy Technician

## 2023-02-09 ENCOUNTER — Other Ambulatory Visit: Payer: Self-pay

## 2023-02-09 NOTE — Progress Notes (Signed)
Specialty Pharmacy Refill Coordination Note  [name] [name] is a 57 y.o. male contacted today regarding refills of specialty medication(s) Dolutegravir-Lamivudine   Patient requested Delivery   Delivery date: 02/18/23   Verified address: [address]  [name]   Medication will be filled on 02/17/23.

## 2023-02-17 ENCOUNTER — Other Ambulatory Visit: Payer: Self-pay

## 2023-03-09 ENCOUNTER — Other Ambulatory Visit: Payer: Self-pay

## 2023-03-09 NOTE — Progress Notes (Signed)
Specialty Pharmacy Refill Coordination Note  [name] [name] is a 57 y.o. male contacted today regarding refills of specialty medication(s) Dolutegravir-lamiVUDine (Dovato)   Patient requested Delivery   Delivery date: 03/13/23   Verified address: [address]  [place] [address]   Medication will be filled on 03/12/23.

## 2023-03-12 ENCOUNTER — Other Ambulatory Visit: Payer: Self-pay

## 2023-03-31 ENCOUNTER — Other Ambulatory Visit: Payer: Self-pay

## 2023-03-31 NOTE — Progress Notes (Signed)
 Specialty Pharmacy Ongoing Clinical Assessment Note  [name] [name] is a 58 y.o. male who is being followed by the specialty pharmacy service for RxSp HIV   Patient's specialty medication(s) reviewed today: Dolutegravir -lamiVUDine  (Dovato )   Missed doses in the last 4 weeks: 0   Patient/Caregiver did not have any additional questions or concerns.   Therapeutic benefit summary: Patient is achieving benefit (11/19/22 HIV RNA Not Detected.)   Adverse events/side effects summary: No adverse events/side effects   Patient's therapy is appropriate to: Continue    Goals Addressed             This Visit's Progress    Achieve Undetectable HIV Viral Load < 20       Patient is on track. Patient will maintain adherence         Follow up:  6 months  Mitzie GORMAN [facility] Specialty Pharmacist

## 2023-03-31 NOTE — Progress Notes (Signed)
 Specialty Pharmacy Refill Coordination Note  [name] [name] is a 58 y.o. male contacted today regarding refills of specialty medication(s) Dolutegravir -lamiVUDine  (Dovato )   Patient requested Delivery   Delivery date: 04/10/23   Verified address: [address]   Medication will be filled on 04/09/23.

## 2023-04-09 ENCOUNTER — Ambulatory Visit (INDEPENDENT_AMBULATORY_CARE_PROVIDER_SITE_OTHER): Payer: Medicaid Other | Admitting: Family

## 2023-04-09 ENCOUNTER — Other Ambulatory Visit: Payer: Self-pay

## 2023-04-09 ENCOUNTER — Encounter: Payer: Self-pay | Admitting: Family

## 2023-04-09 VITALS — BP 127/79 | HR 108 | Temp 98.1°F | Ht 71.0 in | Wt 208.0 lb

## 2023-04-09 DIAGNOSIS — F17210 Nicotine dependence, cigarettes, uncomplicated: Secondary | ICD-10-CM | POA: Diagnosis not present

## 2023-04-09 DIAGNOSIS — B20 Human immunodeficiency virus [HIV] disease: Secondary | ICD-10-CM

## 2023-04-09 DIAGNOSIS — Z720 Tobacco use: Secondary | ICD-10-CM

## 2023-04-09 DIAGNOSIS — Z9189 Other specified personal risk factors, not elsewhere classified: Secondary | ICD-10-CM

## 2023-04-09 DIAGNOSIS — Z113 Encounter for screening for infections with a predominantly sexual mode of transmission: Secondary | ICD-10-CM

## 2023-04-09 DIAGNOSIS — Z0000 Encounter for general adult medical examination without abnormal findings: Secondary | ICD-10-CM

## 2023-04-09 MED ORDER — AMOXICILLIN-POT CLAVULANATE 875-125 MG PO TABS: 1.0000 | ORAL_TABLET | Freq: Two times a day (BID) | ORAL | 0 refills | Status: DC

## 2023-04-09 MED ORDER — DOVATO 50-300 MG PO TABS
1.0000 | ORAL_TABLET | Freq: Every day | ORAL | 5 refills | Status: DC
Start: 2023-04-09 — End: 2023-10-21
  Filled 2023-04-09: qty 30, 30d supply, fill #0
  Filled 2023-04-13 – 2023-04-30 (×2): qty 30, 30d supply, fill #1
  Filled 2023-06-23 – 2023-06-29 (×2): qty 30, 30d supply, fill #2
  Filled 2023-07-17 – 2023-07-21 (×3): qty 30, 30d supply, fill #3
  Filled 2023-08-21: qty 30, 30d supply, fill #4
  Filled 2023-09-18 – 2023-09-24 (×4): qty 30, 30d supply, fill #5

## 2023-04-09 NOTE — Progress Notes (Signed)
04/09/23 CMA: Dovato  Refill too soon until 04/11/23. Voicemail box is full. Does patient have enough on hand until Tuesday 04/14/23?

## 2023-04-09 NOTE — Progress Notes (Signed)
Brief Narrative   Patient ID: [name] [name], male    DOB: December 12, 1965, 58 y.o.   MRN: [id number]  [name] [name] is a 58 y/o male diagnosed with HIV disease in April 2024 with risk factor of sexual contact. Initial viral load was 314,000 with CD4 count 435. Genotype with no significant medication resistant mutations. Entered care at [facility] Stage 2. No history of opportunistic infection. Rapid started on Biktarvy.   Subjective:    Chief Complaint  Patient presents with   Follow-up    HPI:  [name] [name] is a 58 y.o. male with HIV disease last seen on 9//24 with well-controlled virus and good adherence and tolerance to Dovato.  Viral load was undetectable with CD4 count 647.  Kidney function, liver function, electrolytes within normal ranges.  RPR titer positive at 1: 1 with previous history of syphilis treated in the past treated in 2008 with titter of 1:128.  Here today for routine follow up.  [name] [name] has been doing well since his last office visit and continues to take Dovato as prescribed with no adverse side effects or problems obtaining medication from the pharmacy.  Has noted increased weight gain since starting on Dovato and feeling better.  Continues to see psychiatry with recent medication adjustments.  No new concerns/complaints.  Condoms and site-specific STD testing offered.  Healthcare maintenance reviewed and routine dental care is up-to-date per recommendation.  Financially covered by Medicaid.  Denies fevers, chills, night sweats, headaches, changes in vision, neck pain/stiffness, nausea, diarrhea, vomiting, lesions or rashes.  Lab Results  Component Value Date   CD4TCELL 38 11/19/2022   CD4TABS 647 11/19/2022   Lab Results  Component Value Date   HIV1RNAQUANT Not Detected 11/19/2022     Allergies  Allergen Reactions   Vicodin [Hydrocodone-Acetaminophen] Nausea And Vomiting      Outpatient Medications Prior to Visit  Medication Sig Dispense Refill   ALPRAZolam (XANAX)  1 MG tablet Take by mouth.     cariprazine (VRAYLAR) 3 MG capsule Take 3 mg by mouth daily.     dolutegravir-lamiVUDine (DOVATO) 50-300 MG tablet Take 1 tablet by mouth daily.     dolutegravir-lamiVUDine (DOVATO) 50-300 MG tablet Take 1 tablet by mouth daily. 30 tablet 5   clonazePAM (KLONOPIN) 0.5 MG tablet Take by mouth. (Patient not taking: Reported on 04/09/2023)     Oxycodone HCl 10 MG TABS Take 10 mg by mouth daily as needed (pain). (Patient not taking: Reported on 11/19/2022)     No facility-administered medications prior to visit.     Past Medical History:  Diagnosis Date   Arthritis    Back pain    Bipolar disorder (HCC)    MI (myocardial infarction) (HCC)    at age 37   Polysubstance abuse (HCC)    TIA (transient ischemic attack)    Tobacco abuse      Past Surgical History:  Procedure Laterality Date   FOOT SURGERY     IR RADIOLOGIST EVAL & MGMT  07/19/2020   PACEMAKER PLACEMENT        Review of Systems  Constitutional:  Negative for appetite change, chills, fatigue, fever and unexpected weight change.  Eyes:  Negative for visual disturbance.  Respiratory:  Negative for cough, chest tightness, shortness of breath and wheezing.   Cardiovascular:  Negative for chest pain and leg swelling.  Gastrointestinal:  Negative for abdominal pain, constipation, diarrhea, nausea and vomiting.  Genitourinary:  Negative for dysuria, flank pain, frequency, genital sores, hematuria  and urgency.  Skin:  Negative for rash.  Allergic/Immunologic: Negative for immunocompromised state.  Neurological:  Negative for dizziness and headaches.      Objective:    BP 127/79   Pulse (!) 108   Temp 98.1 F (36.7 C) (Temporal)   Ht 5\' 11"  (1.803 m)   Wt 208 lb (94.3 kg)   SpO2 100%   BMI 29.01 kg/m  Nursing note and vital signs reviewed.  Physical Exam Constitutional:      General: He is not in acute distress.    Appearance: He is well-developed.  Eyes:     Conjunctiva/sclera:  Conjunctivae normal.  Cardiovascular:     Rate and Rhythm: Normal rate and regular rhythm.     Heart sounds: Normal heart sounds. No murmur heard.    No friction rub. No gallop.  Pulmonary:     Effort: Pulmonary effort is normal. No respiratory distress.     Breath sounds: Normal breath sounds. No wheezing or rales.  Chest:     Chest wall: No tenderness.  Abdominal:     General: Bowel sounds are normal.     Palpations: Abdomen is soft.     Tenderness: There is no abdominal tenderness.  Musculoskeletal:     Cervical back: Neck supple.  Lymphadenopathy:     Cervical: No cervical adenopathy.  Skin:    General: Skin is warm and dry.     Findings: No rash.  Neurological:     Mental Status: He is alert and oriented to person, place, and time.  Psychiatric:        Behavior: Behavior normal.        Thought Content: Thought content normal.        Judgment: Judgment normal.         04/09/2023    3:47 PM 11/19/2022    2:01 PM 07/30/2022    3:29 PM 06/05/2022   11:13 AM 06/26/2014    9:51 AM  Depression screen PHQ 2/9  Decreased Interest 0 0 1 3 0  Down, Depressed, Hopeless 1 0 1 3 0  PHQ - 2 Score 1 0 2 6 0  Altered sleeping    3   Tired, decreased energy    3   Change in appetite    3   Feeling bad or failure about yourself     3   Trouble concentrating    3   Moving slowly or fidgety/restless    3   Suicidal thoughts    0   PHQ-9 Score    24   Difficult doing work/chores    Extremely dIfficult        Assessment & Plan:    Patient Active Problem List   Diagnosis Date Noted   Healthcare maintenance 11/20/2022   HIV disease (HCC) 07/30/2022   Screening examination for venereal disease 06/06/2022   Cigarette smoker 08/07/2020   Blurry vision, left eye 07/01/2020   Precordial pain 05/10/2020   DOE (dyspnea on exertion)    Sedative, hypnotic, 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   Bipolar affective disorder in remission (HCC) 06/16/2015   ADD (attention deficit  disorder) 09/06/2014   Anxiety 09/06/2014   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  Discussed importance of tobacco cessation to reduce risk of disease and complications in the future which can be compounded by HIV disease.  Discussed treatment options including gums, patches, and medication.  Not ready to quit smoking at this time.      HIV disease (HCC) - Primary   [name] [name] continues to have well-controlled virus with good adherence and tolerance to Dovato.  Reviewed previous lab work and discussed plan of care and U equals U.  Discussed importance of routine follow-up and taking medications daily.  Check blood work.  Continue current dose of Dovato.  Plan for follow-up in 4 months or sooner if needed with lab work on the same day.      Relevant Medications   dolutegravir-lamiVUDine (DOVATO) 50-300 MG tablet   Other Relevant Orders   COMPLETE METABOLIC PANEL WITH GFR (Completed)   HIV-1 RNA quant-no reflex-bld   T-helper cell (CD4)- ([facility] clinic only)   Healthcare maintenance   Discussed importance of safe sexual practice and condom use. Condoms and site specific STD testing offered.  Reviewed vaccinations which were declined. Routine dental care up-to-date. Due for colonoscopy with referral to gastroenterology placed.      Other Visit Diagnoses       Screening for STDs (sexually transmitted diseases)       Relevant Orders   RPR      At risk for colon cancer       Relevant Orders   Ambulatory referral to Gastroenterology        I have discontinued [place]ales [place]. [name] [name]'s Oxycodone HCl and clonazePAM. I am also having him start on amoxicillin-clavulanate. Additionally, I am having him maintain his cariprazine, ALPRAZolam, and Dovato.   Meds ordered this encounter  Medications   dolutegravir-lamiVUDine (DOVATO) 50-300 MG tablet    Sig: Take 1 tablet by mouth daily.    Dispense:  30 tablet    Refill:  5    Supervising Provider:   Judyann Munson [phone]    Prescription Type::   Renewal   amoxicillin-clavulanate (AUGMENTIN) 875-125 MG tablet    Sig: Take 1 tablet by mouth 2 (two) times daily.    Dispense:  14 tablet    Refill:  0    Supervising Provider:   Judyann Munson [[id number]]     Follow-up: Return in about 4 months (around 08/07/2023). or sooner if needed.    Marcos Eke, MSN, FNP-C Nurse Practitioner [facility] for Infectious Disease [facility] Main number: [phone]

## 2023-04-09 NOTE — Patient Instructions (Addendum)
Nice to see you.  We will check your lab work today.  Continue to take your medication daily as prescribed.  Refills have been sent to the pharmacy.  Plan for follow up in 4 months or sooner if needed with lab work on the same day.  Have a great day and stay safe!  

## 2023-04-10 ENCOUNTER — Encounter: Payer: Self-pay | Admitting: Family

## 2023-04-10 LAB — T-HELPER CELL (CD4) - (RCID CLINIC ONLY)
CD4 % Helper T Cell: 39 % (ref 33–65)
CD4 T Cell Abs: 869 /uL (ref 400–1790)

## 2023-04-10 NOTE — Assessment & Plan Note (Signed)
Mr. Wigington continues to smoke tobacco daily.  Discussed importance of tobacco cessation to reduce risk of disease and complications in the future which can be compounded by HIV disease.  Discussed treatment options including gums, patches, and medication.  Not ready to quit smoking at this time.

## 2023-04-10 NOTE — Assessment & Plan Note (Signed)
Mr. Judson continues to have well-controlled virus with good adherence and tolerance to Dovato.  Reviewed previous lab work and discussed plan of care and U equals U.  Discussed importance of routine follow-up and taking medications daily.  Check blood work.  Continue current dose of Dovato.  Plan for follow-up in 4 months or sooner if needed with lab work on the same day.

## 2023-04-10 NOTE — Assessment & Plan Note (Signed)
Discussed importance of safe sexual practice and condom use. Condoms and site specific STD testing offered.  Reviewed vaccinations which were declined. Routine dental care up-to-date. Due for colonoscopy with referral to gastroenterology placed.

## 2023-04-13 ENCOUNTER — Other Ambulatory Visit: Payer: Self-pay

## 2023-04-13 ENCOUNTER — Other Ambulatory Visit (HOSPITAL_COMMUNITY): Payer: Self-pay

## 2023-04-13 LAB — COMPLETE METABOLIC PANEL WITH GFR
AG Ratio: 1.8 (calc) (ref 1.0–2.5)
ALT: 11 U/L (ref 9–46)
AST: 16 U/L (ref 10–35)
Albumin: 4.4 g/dL (ref 3.6–5.1)
Alkaline phosphatase (APISO): 93 U/L (ref 35–144)
BUN: 16 mg/dL (ref 7–25)
CO2: 29 mmol/L (ref 20–32)
Calcium: 9.4 mg/dL (ref 8.6–10.3)
Chloride: 104 mmol/L (ref 98–110)
Creat: 1.09 mg/dL (ref 0.70–1.30)
Globulin: 2.5 g/dL (ref 1.9–3.7)
Glucose, Bld: 84 mg/dL (ref 65–99)
Potassium: 4.5 mmol/L (ref 3.5–5.3)
Sodium: 141 mmol/L (ref 135–146)
Total Bilirubin: 0.2 mg/dL (ref 0.2–1.2)
Total Protein: 6.9 g/dL (ref 6.1–8.1)
eGFR: 80 mL/min/{1.73_m2} (ref >=60)

## 2023-04-13 LAB — HIV-1 RNA QUANT-NO REFLEX-BLD
HIV 1 RNA Quant: <20 {copies}/mL — ABNORMAL HIGH
HIV-1 RNA Quant, Log: <1.3 {Log} — ABNORMAL HIGH

## 2023-04-13 LAB — RPR: RPR Ser Ql: NONREACTIVE

## 2023-04-13 MED ORDER — AMOXICILLIN-POT CLAVULANATE 875-125 MG PO TABS
1.0000 | ORAL_TABLET | Freq: Two times a day (BID) | ORAL | 0 refills | Status: DC
  Filled 2023-04-13 – 2023-05-08 (×3): qty 14, 7d supply, fill #0

## 2023-04-15 ENCOUNTER — Other Ambulatory Visit (HOSPITAL_COMMUNITY): Payer: Self-pay

## 2023-04-30 ENCOUNTER — Other Ambulatory Visit: Payer: Self-pay

## 2023-04-30 NOTE — Progress Notes (Signed)
Specialty Pharmacy Refill Coordination Note  [name] [name] is a 58 y.o. male contacted today regarding refills of specialty medication(s) Dolutegravir-lamiVUDine (Dovato)   Patient requested Delivery   Delivery date: 05/12/23   Verified address: [address]   Medication will be filled on 05/11/23.

## 2023-05-08 ENCOUNTER — Other Ambulatory Visit: Payer: Self-pay

## 2023-05-08 ENCOUNTER — Other Ambulatory Visit (HOSPITAL_COMMUNITY): Payer: Self-pay

## 2023-05-27 ENCOUNTER — Other Ambulatory Visit: Payer: Self-pay | Admitting: Family

## 2023-05-28 ENCOUNTER — Other Ambulatory Visit: Payer: Self-pay

## 2023-06-23 ENCOUNTER — Other Ambulatory Visit: Payer: Self-pay | Admitting: Family

## 2023-06-24 ENCOUNTER — Other Ambulatory Visit: Payer: Self-pay

## 2023-06-28 ENCOUNTER — Other Ambulatory Visit: Payer: Self-pay | Admitting: Family

## 2023-06-29 ENCOUNTER — Other Ambulatory Visit: Payer: Self-pay

## 2023-06-29 NOTE — Progress Notes (Signed)
 Specialty Pharmacy Refill Coordination Note  [name] [name] is a 58 y.o. male contacted today regarding refills of specialty medication(s) Dolutegravir-lamiVUDine (Dovato)   Patient requested Delivery   Delivery date: 07/01/23   Verified address: [address]   Medication will be filled on 06/30/23.

## 2023-07-06 ENCOUNTER — Other Ambulatory Visit: Payer: Self-pay

## 2023-07-17 ENCOUNTER — Other Ambulatory Visit: Payer: Self-pay

## 2023-07-21 ENCOUNTER — Other Ambulatory Visit: Payer: Self-pay

## 2023-07-21 ENCOUNTER — Other Ambulatory Visit (HOSPITAL_COMMUNITY): Payer: Self-pay

## 2023-07-21 NOTE — Progress Notes (Signed)
 Specialty Pharmacy Refill Coordination Note  [name] [name] is a 58 y.o. male contacted today regarding refills of specialty medication(s) No data recorded  Patient requested (Patient-Rptd) Delivery   Delivery date: (Patient-Rptd) 05/25/23   Verified address: (Patient-Rptd) [address] [place], [place]    Medication will be filled on 07/27/23. New delivery date is 07/28/23. Patient has been notified.

## 2023-07-27 ENCOUNTER — Other Ambulatory Visit: Payer: Self-pay

## 2023-08-21 ENCOUNTER — Other Ambulatory Visit: Payer: Self-pay | Admitting: Family

## 2023-08-21 ENCOUNTER — Other Ambulatory Visit: Payer: Self-pay

## 2023-08-21 ENCOUNTER — Encounter (INDEPENDENT_AMBULATORY_CARE_PROVIDER_SITE_OTHER): Payer: Self-pay

## 2023-08-21 NOTE — Progress Notes (Signed)
 Specialty Pharmacy Refill Coordination Note  HY SWIATEK is a 58 y.o. male contacted today regarding refills of specialty medication(s) Dolutegravir -lamiVUDine  (Dovato )   Patient requested (Patient-Rptd) Delivery   Delivery date: 08/25/23   Verified address: (Patient-Rptd) [address] [place], [place]    Medication will be filled on 06.09.25.

## 2023-08-24 ENCOUNTER — Other Ambulatory Visit: Payer: Self-pay

## 2023-09-16 ENCOUNTER — Other Ambulatory Visit: Payer: Self-pay

## 2023-09-21 ENCOUNTER — Other Ambulatory Visit: Payer: Self-pay

## 2023-09-21 ENCOUNTER — Other Ambulatory Visit (HOSPITAL_COMMUNITY): Payer: Self-pay

## 2023-09-21 NOTE — Progress Notes (Signed)
 Benefits Investigation Started  Rejection: Patient is not covered  Routed to: Donna/Courtney

## 2023-09-22 ENCOUNTER — Other Ambulatory Visit (HOSPITAL_COMMUNITY): Payer: Self-pay

## 2023-09-23 ENCOUNTER — Other Ambulatory Visit (HOSPITAL_COMMUNITY): Payer: Self-pay

## 2023-09-24 ENCOUNTER — Other Ambulatory Visit: Payer: Self-pay

## 2023-09-28 ENCOUNTER — Other Ambulatory Visit: Payer: Self-pay

## 2023-09-28 NOTE — Progress Notes (Signed)
 Specialty Pharmacy Refill Coordination Note  [name] [name] is a 58 y.o. male contacted today regarding refills of specialty medication(s) Dolutegravir -lamiVUDine  (Dovato )   Patient requested Delivery   Delivery date: 09/29/23   Verified address: [address]   [place] [address]   Medication will be filled on 09/28/23.

## 2023-10-21 ENCOUNTER — Other Ambulatory Visit: Payer: Self-pay | Admitting: Family

## 2023-10-21 ENCOUNTER — Other Ambulatory Visit: Payer: Self-pay

## 2023-10-21 ENCOUNTER — Other Ambulatory Visit (HOSPITAL_COMMUNITY): Payer: Self-pay

## 2023-10-21 DIAGNOSIS — B20 Human immunodeficiency virus [HIV] disease: Secondary | ICD-10-CM

## 2023-10-21 MED ORDER — DOVATO 50-300 MG PO TABS
1.0000 | ORAL_TABLET | Freq: Every day | ORAL | 0 refills | Status: DC
Start: 2023-10-21 — End: 2023-11-27
  Filled 2023-10-21 – 2023-10-29 (×4): qty 30, 30d supply, fill #0

## 2023-10-22 ENCOUNTER — Other Ambulatory Visit (HOSPITAL_COMMUNITY): Payer: Self-pay

## 2023-10-23 ENCOUNTER — Other Ambulatory Visit: Payer: Self-pay

## 2023-10-26 ENCOUNTER — Other Ambulatory Visit: Payer: Self-pay

## 2023-10-29 ENCOUNTER — Other Ambulatory Visit: Payer: Self-pay

## 2023-10-29 NOTE — Progress Notes (Signed)
 Specialty Pharmacy Refill Coordination Note  [name] [name] is a 58 y.o. male contacted today regarding refills of specialty medication(s) Dolutegravir -lamiVUDine  (Dovato )   Patient requested Delivery   Delivery date: 11/03/23   Verified address: [address]   [place] [address]   Medication will be filled on 08.18.25.

## 2023-11-02 ENCOUNTER — Other Ambulatory Visit: Payer: Self-pay

## 2023-11-02 ENCOUNTER — Ambulatory Visit (HOSPITAL_COMMUNITY)
Admission: EM | Admit: 2023-11-02 | Discharge: 2023-11-02 | Disposition: A | Discharge status: Home / Self Care | Attending: Internal Medicine | Admitting: Internal Medicine

## 2023-11-02 ENCOUNTER — Ambulatory Visit: Payer: Self-pay

## 2023-11-02 ENCOUNTER — Ambulatory Visit (INDEPENDENT_AMBULATORY_CARE_PROVIDER_SITE_OTHER)

## 2023-11-02 ENCOUNTER — Encounter (HOSPITAL_COMMUNITY): Payer: Self-pay | Admitting: *Deleted

## 2023-11-02 DIAGNOSIS — M79642 Pain in left hand: Secondary | ICD-10-CM

## 2023-11-02 NOTE — Discharge Instructions (Addendum)
 X-ray of the left hand done today.  Final evaluation by the radiologist does not show any acute findings. We recommend the following:  Wear compression wrap during the day when active. Remove at night. Remove if numbness, tingling or increased pain occurs. Do this for 5-7 days then may discontinue and increase activity as tolerated.   Ice the area 2-3 times daily for 10-15 minutes to help with pain and swelling. Do not apply ice directly to the skin.  If symptoms fail to improve then recommend following up with orthopedics.

## 2023-11-02 NOTE — ED Provider Notes (Signed)
 [facility]    CSN: [id number] Arrival date & time: 11/02/23  1500      History   Chief Complaint Chief Complaint  Patient presents with   Hand Injury   Headache    HPI [name] [name] is a 58 y.o. male.   58 y.o. male who presents to urgent care with complaints of left hand pain.  This started today when his wife dog pulled sharply on the leash and his hand was strapped around it.  This pulled his hand forward and then backwards.  He immediately had pain and swelling on the hand around the index and middle fingers.  He cannot make a fist.  He does not have any other pain.  He denies injury to the elbow, shoulder or wrist.  He also reports that he has had recurrent headaches for over a year.  This has been evaluated by neurology and he is going to follow up with them.  He does not have a headache today. There has been no change in the headaches that he gets.    Hand Injury Associated symptoms: no back pain and no fever   Headache Associated symptoms: no abdominal pain, no back pain, no cough, no ear pain, no eye pain, no fever, no seizures, no sore throat and no vomiting     Past Medical History:  Diagnosis Date   Arthritis    Back pain    Bipolar disorder (HCC)    MI (myocardial infarction) (HCC)    at age 51   Polysubstance abuse (HCC)    TIA (transient ischemic attack)    Tobacco abuse     Patient Active Problem List   Diagnosis Date Noted   Healthcare maintenance 11/20/2022   HIV disease (HCC) 07/30/2022   Screening examination for venereal disease 06/06/2022   Cigarette smoker 08/07/2020   Blurry vision, left eye 07/01/2020   Precordial pain 05/10/2020   DOE (dyspnea on exertion)    Sedative, hypnotic, 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   Bipolar affective disorder in remission (HCC) 06/16/2015   ADD (attention deficit disorder) 09/06/2014   Anxiety 09/06/2014   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   Amphetamine use disorder, 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 Authorizing Provider  ALPRAZolam  (XANAX ) 1 MG tablet Take by mouth. 04/08/23  Yes [provider]  cariprazine  (VRAYLAR ) 3 MG capsule Take 3 mg by mouth daily.   Yes [provider]  dolutegravir -lamiVUDine  (DOVATO ) 50-300 MG tablet Take 1 tablet by mouth daily. *Need appointment for future refills* 10/21/23  Yes Calone, Gregory D, FNP  amoxicillin -clavulanate (AUGMENTIN ) 875-125 MG tablet Take 1 tablet by mouth 2 (two) times daily. 04/13/23   Philemon Cordella BIRCH, FNP    Family History Family History  Problem Relation Age of Onset   Heart failure Mother    Hypertension Mother    Stroke  Mother     Social History Social History   Tobacco Use   Smoking status: Every Day    Current packs/day: 2.00    Average packs/day: 2.0 packs/day for 35.0 years (70.0 ttl pk-yrs)    Types: Cigarettes   Smokeless tobacco: Never   Tobacco comments:    6 cigs per day 08/07/20//lmr  Vaping Use   Vaping status: Never Used  Substance Use Topics   Alcohol use: No   Drug use: Not Currently    Types: Cocaine, Marijuana, Methamphetamines    Comment: Hx of drug use     Allergies   Vicodin [hydrocodone-acetaminophen ]   Review of Systems Review of Systems  Constitutional:  Negative for chills and fever.  HENT:  Negative for ear pain and sore throat.    Eyes:  Negative for pain and visual disturbance.  Respiratory:  Negative for cough and shortness of breath.   Cardiovascular:  Negative for chest pain and palpitations.  Gastrointestinal:  Negative for abdominal pain and vomiting.  Genitourinary:  Negative for dysuria and hematuria.  Musculoskeletal:  Negative for arthralgias and back pain.  Skin:  Negative for color change and rash.  Neurological:  Negative for seizures, syncope and headaches.  All other systems reviewed and are negative.    Physical Exam Triage Vital Signs ED Triage Vitals  Encounter Vitals Group     BP 11/02/23 1612 126/78     Girls Systolic BP Percentile --      Girls Diastolic BP Percentile --      Boys Systolic BP Percentile --      Boys Diastolic BP Percentile --      Pulse Rate 11/02/23 1612 (!) 106     Resp 11/02/23 1612 20     Temp 11/02/23 1612 97.6 F (36.4 C)     Temp src --      SpO2 11/02/23 1612 94 %     Weight --      Height --      Head Circumference --      Peak Flow --      Pain Score 11/02/23 1608 8     Pain Loc --      Pain Education --      Exclude from Growth Chart --    No data found.  Updated Vital Signs BP 126/78   Pulse (!) 106   Temp 97.6 F (36.4 C)   Resp 20   SpO2 94%   Visual Acuity Right Eye Distance:   Left Eye Distance:   Bilateral Distance:    Right Eye Near:   Left Eye Near:    Bilateral Near:     Physical Exam Vitals and nursing note reviewed.  Constitutional:      General: He is not in acute distress.    Appearance: He is well-developed.  HENT:     Head: Normocephalic and atraumatic.  Eyes:     Conjunctiva/sclera: Conjunctivae normal.  Cardiovascular:     Rate and Rhythm: Normal rate and regular rhythm.     Heart sounds: No murmur heard. Pulmonary:     Effort: Pulmonary effort is normal. No respiratory distress.     Breath sounds: Normal breath sounds.  Abdominal:     Palpations: Abdomen is soft.     Tenderness: There is no abdominal  tenderness.  Musculoskeletal:        General: No swelling.     Left hand: Swelling (2nd and 3rd MC), tenderness and bony tenderness present. Decreased  range of motion. There is no disruption of two-point discrimination. Normal capillary refill. Normal pulse.     Cervical back: Neck supple.  Skin:    General: Skin is warm and dry.     Capillary Refill: Capillary refill takes less than 2 seconds.  Neurological:     Mental Status: He is alert.     Sensory: Sensation is intact.     Coordination: Coordination is intact.     Gait: Gait is intact.  Psychiatric:        Mood and Affect: Mood normal.      UC Treatments / Results  Labs (all labs ordered are listed, but only abnormal results are displayed) Labs Reviewed - No data to display  EKG   Radiology DG Hand Complete Left Result Date: 11/02/2023 CLINICAL DATA:  Pain and swelling, left hand injured while walking dog yesterday EXAM: LEFT HAND - COMPLETE 3+ VIEW COMPARISON:  None Available. FINDINGS: Frontal, oblique, and lateral views of the left hand are obtained. No acute fracture, subluxation, or dislocation. Joint spaces are relatively well preserved. Soft tissues are unremarkable. IMPRESSION: 1. No acute displaced fracture. Electronically Signed   By: Ozell Daring M.D.   On: 11/02/2023 17:08    Procedures Procedures (including critical care time)  Medications Ordered in UC Medications - No data to display  Initial Impression / Assessment and Plan / UC Course  I have reviewed the triage vital signs and the nursing notes.  Pertinent labs & imaging results that were available during my care of the patient were reviewed by me and considered in my medical decision making (see chart for details).     Left hand pain - Plan: DG Hand Complete Left, DG Hand Complete Left   X-ray of the left hand done today.  Final evaluation by the radiologist does not show any acute findings. We recommend the following:  Wear compression wrap  during the day when active. Remove at night. Remove if numbness, tingling or increased pain occurs. Do this for 5-7 days then may discontinue and increase activity as tolerated.   Ice the area 2-3 times daily for 10-15 minutes to help with pain and swelling. Do not apply ice directly to the skin.  If symptoms fail to improve then recommend following up with orthopedics.  Final Clinical Impressions(s) / UC Diagnoses   Final diagnoses:  Left hand pain     Discharge Instructions      X-ray of the left hand done today.  Final evaluation by the radiologist does not show any acute findings. We recommend the following:  Wear compression wrap during the day when active. Remove at night. Remove if numbness, tingling or increased pain occurs. Do this for 5-7 days then may discontinue and increase activity as tolerated.   Ice the area 2-3 times daily for 10-15 minutes to help with pain and swelling. Do not apply ice directly to the skin.  If symptoms fail to improve then recommend following up with orthopedics.     ED Prescriptions   None    PDMP not reviewed this encounter.   Teresa Almarie LABOR, [place] 11/02/23 1720

## 2023-11-02 NOTE — Telephone Encounter (Signed)
 FYI Only or Action Required?: FYI only for provider.  Patient was last seen in primary care on [facility].  Called Nurse Triage reporting Loss of Vision, Headache, and Arm Injury.  Symptoms began hand injury yesterday, H/A chronic for years.  Interventions attempted: Ice/heat application.  Symptoms are: unchanged.  Triage Disposition: See HCP Within 4 Hours (Or PCP Triage)  Patient/caregiver understands and will follow disposition?: Unsure      Copied from CRM #[id number]. Topic: Clinical - Red Word Triage >> Nov 02, 2023 10:43 AM Sophia H wrote: Red Word that prompted transfer to Nurse Triage: Severe pain on left side of head, causing vision loss, patient also states his arm snapped like a rubber band and hasn't gone back into place.  Not an established patient of the clinics. Reason for Disposition  [1] SEVERE headache (e.g., excruciating) AND [2] not improved after 2 hours of pain medicine  [1] Large swelling or bruise (> 2 inches or 5 cm) AND [2] can't use injured hand normally (e.g., make a fist, open hand fully, hold a glass of water)  Answer Assessment - Initial Assessment Questions 1. MECHANISM: How did the injury happen?     Reports was walking dog and dog too off and stretched/snapped his arm 2. ONSET: When did the injury happen? (e.g., minutes, hours ago)      yesterday 3. LOCATION: Where is the injury located? Which arm?     L hand 4. APPEARANCE of INJURY: What does the injury look like?      Unable to describe - swelling 5. SEVERITY: Can you use the arm normally?      no 6. SWELLING or BRUISING: is there any swelling or bruising? If Yes, ask: How large is it? (e.g., inches, centimeters)      Unable to answer 7. PAIN: Is there pain? If Yes, ask: How bad is the pain? (Scale 0-10; or none, mild, moderate, severe)     5-7/10 8. TETANUS: For any breaks in the skin, ask: When was your last tetanus booster?     N/a 9. OTHER SYMPTOMS: Do you have  any other symptoms?  (e.g., numbness in hand)     denies 10. PREGNANCY: Is there any chance you are pregnant? When was your last menstrual period?       N/a    Pt initially called to establish care with [facility] but having acute sx. Triage was difficult d/t flight of consciousness/talking fast. Triager advised [facility] and provided address/number for pt to walk in, since no appt could be made.  Answer Assessment - Initial Assessment Questions 1. LOCATION: Where does it hurt?      L side H/A 2. ONSET: When did the headache start? (e.g., minutes, hours, days)      X 1 year 3. PATTERN: Does the pain come and go, or has it been constant since it started?     Comes and goes 4. SEVERITY: How bad is the pain? and What does it keep you from doing?  (e.g., Scale 1-10; mild, moderate, or severe)     Does not have H/A 5. RECURRENT SYMPTOM: Have you ever had headaches before? If Yes, ask: When was the last time? and What happened that time?      Ongoing x 1 year 6. CAUSE: What do you think is causing the headache?     unknown 7. MIGRAINE: Have you been diagnosed with migraine headaches? If Yes, ask: Is this headache similar?  denies 8. HEAD INJURY: Has there been any recent injury to your head?      denies 9. OTHER SYMPTOMS: Do you have any other symptoms? (e.g., fever, stiff neck, eye pain, sore throat, cold symptoms)     Vision loss with H/A, nausea, passing out 10. PREGNANCY: Is there any chance you are pregnant? When was your last menstrual period?       N/a  Protocols used: Headache-A-AH, Arm Injury-A-AH, Hand Injury-A-AH

## 2023-11-02 NOTE — ED Triage Notes (Addendum)
 PT reports he was walking his wolf dog and his left hand was pulled by dog.  Pt also reports he has a sever HA the worse pain he has ever had in his head. Pt reports nausea and vision changes with HA. PT was asked what his pain level his HA was in triage. Pt reported he did not have a HA now.

## 2023-11-02 NOTE — ED Notes (Signed)
 Attempted to apply ace bands. Patient got made and refused ACE bandage.

## 2023-11-13 ENCOUNTER — Ambulatory Visit: Payer: Self-pay

## 2023-11-25 ENCOUNTER — Ambulatory Visit: Admitting: Family Medicine

## 2023-11-25 ENCOUNTER — Other Ambulatory Visit: Payer: Self-pay

## 2023-11-25 ENCOUNTER — Other Ambulatory Visit: Payer: Self-pay | Admitting: Family

## 2023-11-26 ENCOUNTER — Encounter: Payer: Self-pay | Admitting: Family Medicine

## 2023-11-26 ENCOUNTER — Ambulatory Visit (INDEPENDENT_AMBULATORY_CARE_PROVIDER_SITE_OTHER): Admitting: Family Medicine

## 2023-11-26 VITALS — BP 128/84 | HR 110 | Temp 98.2°F | Ht 71.0 in | Wt 213.0 lb

## 2023-11-26 DIAGNOSIS — H538 Other visual disturbances: Secondary | ICD-10-CM | POA: Diagnosis not present

## 2023-11-26 NOTE — Progress Notes (Signed)
   New Patient Office Visit  Subjective    Patient ID: [name] [name], male    DOB: 18-Sep-1965  Age: 58 y.o. MRN: [id number]  CC:  Chief Complaint  Patient presents with   Numbness    Numbness in left arm and side of left face for over 6 months. Blurred vision in left eye. No appetite. Patient has went to the hospital several times for this same issue. He would like some help.      Assessment & Plan:   Blurry vision, left eye   Patient left  AMA before proper evaluation. Visit incomplete.  No follow-ups on file.   Vinary K Remer Couse, MD  Pt was seen today for blurry vision, headaches, left side paresthesias. States he was seen in the ER for this issue several times and was told he has sinus issues and was told to follow up with PCP.  Pt is new to me wanted to get more information and mentioned about getting Ophthalmology and Neurological evaluation. Pt was upset and mentioned he does not think he has eye problems or migraines. Pt wanted me to fix his issue. I mentioned to the patient this is a primary care office, I need to evaluate first before recommend treatment and we may have to refer to a specialist for proper evaluation. Pt stood up and left the office and walked out before completing the visit.     [name] [name] presents to establish care   Outpatient Encounter Medications as of 11/26/2023  Medication Sig   ALPRAZolam  (XANAX ) 1 MG tablet Take by mouth.   dolutegravir -lamiVUDine  (DOVATO ) 50-300 MG tablet Take 1 tablet by mouth daily. *Need appointment for future refills*   [DISCONTINUED] amoxicillin -clavulanate (AUGMENTIN ) 875-125 MG tablet Take 1 tablet by mouth 2 (two) times daily.   [DISCONTINUED] cariprazine  (VRAYLAR ) 3 MG capsule Take 3 mg by mouth daily.   No facility-administered encounter medications on file as of 11/26/2023.      Review of Systems  All other systems reviewed and are negative.       Objective    BP 128/84   Pulse (!) 110   Temp 98.2 F  (36.8 C)   Ht 5' 11 (1.803 m)   Wt 213 lb (96.6 kg)   SpO2 98%   BMI 29.71 kg/m   Physical Exam

## 2023-11-27 ENCOUNTER — Other Ambulatory Visit: Payer: Self-pay

## 2023-11-27 ENCOUNTER — Other Ambulatory Visit: Payer: Self-pay | Admitting: Family

## 2023-11-27 DIAGNOSIS — B20 Human immunodeficiency virus [HIV] disease: Secondary | ICD-10-CM

## 2023-11-27 NOTE — Progress Notes (Signed)
 Specialty Pharmacy Refill Coordination Note  [name] [name] is a 58 y.o. male contacted today regarding refills of specialty medication(s) Dolutegravir -lamiVUDine  (Dovato )   Patient requested Delivery   Delivery date: 12/02/23   Verified address: [address]   [place] [address]   Medication will be filled on 09.15.25 or 09.16.25.    This fill date is pending response to refill request from provider. Patient is aware and if they have not received fill by intended date they must follow up with pharmacy.

## 2023-11-30 NOTE — Telephone Encounter (Signed)
 Patient needs appointment, placed on scheduling list.

## 2023-12-08 ENCOUNTER — Other Ambulatory Visit: Payer: Self-pay

## 2023-12-08 MED ORDER — DOVATO 50-300 MG PO TABS
1.0000 | ORAL_TABLET | Freq: Every day | ORAL | 0 refills | Status: DC
Start: 2023-12-08 — End: 2023-12-30
  Filled 2023-12-08: qty 30, 30d supply, fill #0

## 2023-12-22 ENCOUNTER — Ambulatory Visit: Admitting: Family

## 2023-12-28 ENCOUNTER — Other Ambulatory Visit: Payer: Self-pay

## 2023-12-29 ENCOUNTER — Ambulatory Visit: Admitting: Family

## 2023-12-30 ENCOUNTER — Other Ambulatory Visit: Payer: Self-pay | Admitting: Family

## 2023-12-30 ENCOUNTER — Other Ambulatory Visit: Payer: Self-pay

## 2023-12-30 DIAGNOSIS — B20 Human immunodeficiency virus [HIV] disease: Secondary | ICD-10-CM

## 2023-12-31 ENCOUNTER — Other Ambulatory Visit: Payer: Self-pay

## 2024-01-01 NOTE — Telephone Encounter (Signed)
 Follow up 10/29. Refill pending

## 2024-01-04 ENCOUNTER — Other Ambulatory Visit: Payer: Self-pay | Admitting: Family

## 2024-01-04 ENCOUNTER — Other Ambulatory Visit: Payer: Self-pay

## 2024-01-04 DIAGNOSIS — B20 Human immunodeficiency virus [HIV] disease: Secondary | ICD-10-CM

## 2024-01-05 ENCOUNTER — Other Ambulatory Visit: Payer: Self-pay

## 2024-01-05 MED ORDER — DOVATO 50-300 MG PO TABS
1.0000 | ORAL_TABLET | Freq: Every day | ORAL | 0 refills | Status: DC
  Filled 2024-01-05 – 2024-02-27 (×4): qty 30, 30d supply, fill #0

## 2024-01-06 ENCOUNTER — Other Ambulatory Visit (HOSPITAL_COMMUNITY): Payer: Self-pay

## 2024-01-06 ENCOUNTER — Other Ambulatory Visit: Payer: Self-pay

## 2024-01-08 ENCOUNTER — Other Ambulatory Visit (HOSPITAL_COMMUNITY): Payer: Self-pay

## 2024-01-13 ENCOUNTER — Ambulatory Visit: Admitting: Family

## 2024-01-14 ENCOUNTER — Other Ambulatory Visit: Payer: Self-pay

## 2024-02-15 NOTE — Progress Notes (Signed)
 The ASCVD Risk score (Arnett DK, et al., 2019) failed to calculate for the following reasons:   Risk score cannot be calculated because patient has a medical history suggesting prior/existing ASCVD  Arlon Bergamo, BSN, RN

## 2024-02-23 ENCOUNTER — Other Ambulatory Visit: Payer: Self-pay

## 2024-02-26 ENCOUNTER — Encounter: Payer: Self-pay | Admitting: Pharmacist

## 2024-02-29 ENCOUNTER — Other Ambulatory Visit: Payer: Self-pay

## 2024-02-29 NOTE — Progress Notes (Signed)
 Specialty Pharmacy Refill Coordination Note  [name] [name] is a 58 y.o. male contacted today regarding refills of specialty medication(s) Dolutegravir -lamiVUDine  (Dovato )   Patient requested Delivery   Delivery date: 03/01/24   Verified address: [address]   [place] [address]   Medication will be filled on: 02/29/24

## 2024-02-29 NOTE — Progress Notes (Signed)
 Specialty Pharmacy Ongoing Clinical Assessment Note  [name] [name] is a 58 y.o. male who is being followed by the specialty pharmacy service for RxSp HIV   Patient's specialty medication(s) reviewed today: Dolutegravir -lamiVUDine  (Dovato )   Missed doses in the last 4 weeks: No data recorded  Patient/Caregiver did not have any additional questions or concerns.   Therapeutic benefit summary: Patient is achieving benefit   Adverse events/side effects summary: No adverse events/side effects   Patient's therapy is appropriate to: Continue    Goals Addressed             This Visit's Progress    Achieve Undetectable HIV Viral Load < 20   On track    Patient is on track. Patient will maintain adherence. Patient's last viral load on 04/09/23 was <20 copies/mL. Patient has follow up this week for new labs          Follow up: 12 months  [facility]

## 2024-03-22 ENCOUNTER — Other Ambulatory Visit: Payer: Self-pay

## 2024-03-22 ENCOUNTER — Other Ambulatory Visit: Payer: Self-pay | Admitting: Pharmacist

## 2024-03-22 DIAGNOSIS — B20 Human immunodeficiency virus [HIV] disease: Secondary | ICD-10-CM

## 2024-03-22 MED ORDER — DOVATO 50-300 MG PO TABS
1.0000 | ORAL_TABLET | Freq: Every day | ORAL | 0 refills | Status: AC
  Filled 2024-03-22 – 2024-03-23 (×2): qty 30, 30d supply, fill #0

## 2024-03-23 ENCOUNTER — Telehealth: Payer: Self-pay

## 2024-03-23 ENCOUNTER — Other Ambulatory Visit: Payer: Self-pay

## 2024-03-23 NOTE — Telephone Encounter (Signed)
 Refill was sent yesterday by Alan Geralds, RPH.   Markeeta Scalf, BSN, RN

## 2024-03-23 NOTE — Telephone Encounter (Signed)
 Patient called to schedule a follow up with Gregory Calone, requesting medication refill for Dovato  to be sent to [facility]. Best contact number is [phone]

## 2024-03-25 ENCOUNTER — Other Ambulatory Visit: Payer: Self-pay

## 2024-03-25 DIAGNOSIS — Z113 Encounter for screening for infections with a predominantly sexual mode of transmission: Secondary | ICD-10-CM

## 2024-03-25 DIAGNOSIS — B20 Human immunodeficiency virus [HIV] disease: Secondary | ICD-10-CM

## 2024-03-25 NOTE — Progress Notes (Signed)
 Specialty Pharmacy Refill Coordination Note  [name] [name] is a 59 y.o. male contacted today regarding refills of specialty medication(s) Dolutegravir -lamiVUDine  (Dovato )   Patient requested Delivery   Delivery date: 03/30/24   Verified address: [address]   [place] [address]   Medication will be filled on: 03/29/24

## 2024-03-28 ENCOUNTER — Other Ambulatory Visit: Payer: Self-pay

## 2024-04-05 ENCOUNTER — Ambulatory Visit: Admitting: Family

## 2024-04-20 ENCOUNTER — Other Ambulatory Visit: Payer: Self-pay

## 2024-04-20 ENCOUNTER — Other Ambulatory Visit: Payer: Self-pay | Admitting: Pharmacist

## 2024-04-20 DIAGNOSIS — B20 Human immunodeficiency virus [HIV] disease: Secondary | ICD-10-CM

## 2024-04-21 ENCOUNTER — Other Ambulatory Visit: Payer: Self-pay
